# Patient Record
Sex: Female | Born: 1965
Health system: Southern US, Community
[De-identification: ages and names within clinical notes are randomized; demographics above are authoritative.]

## PROBLEM LIST (undated history)

## (undated) DIAGNOSIS — M419 Scoliosis, unspecified: Secondary | ICD-10-CM

## (undated) DIAGNOSIS — M26629 Arthralgia of temporomandibular joint, unspecified side: Secondary | ICD-10-CM

## (undated) DIAGNOSIS — J302 Other seasonal allergic rhinitis: Secondary | ICD-10-CM

## (undated) HISTORY — DX: Arthralgia of temporomandibular joint, unspecified side: M26.629

## (undated) HISTORY — DX: Scoliosis, unspecified: M41.9

## (undated) HISTORY — DX: Other seasonal allergic rhinitis: J30.2

---

## 2007-10-05 ENCOUNTER — Emergency Department: Payer: Self-pay | Admitting: Emergency Medicine

## 2008-01-09 ENCOUNTER — Ambulatory Visit: Payer: Self-pay | Admitting: Unknown Physician Specialty

## 2009-08-09 ENCOUNTER — Ambulatory Visit: Payer: Self-pay | Admitting: Family Medicine

## 2010-01-28 ENCOUNTER — Ambulatory Visit: Payer: Self-pay | Admitting: Family Medicine

## 2010-12-13 ENCOUNTER — Ambulatory Visit: Payer: Self-pay | Admitting: Family Medicine

## 2011-05-19 ENCOUNTER — Ambulatory Visit: Payer: Self-pay | Admitting: Internal Medicine

## 2011-05-25 ENCOUNTER — Ambulatory Visit: Payer: Self-pay | Admitting: Family Medicine

## 2015-05-21 ENCOUNTER — Ambulatory Visit
Admission: RE | Admit: 2015-05-21 | Discharge: 2015-05-21 | Disposition: A | Discharge status: Home / Self Care | Payer: 59 | Source: Ambulatory Visit | Attending: Family Medicine | Admitting: Family Medicine

## 2015-05-21 ENCOUNTER — Telehealth: Payer: Self-pay | Admitting: Family Medicine

## 2015-05-21 ENCOUNTER — Ambulatory Visit (INDEPENDENT_AMBULATORY_CARE_PROVIDER_SITE_OTHER): Payer: 59 | Admitting: Family Medicine

## 2015-05-21 ENCOUNTER — Encounter: Payer: Self-pay | Admitting: Family Medicine

## 2015-05-21 VITALS — BP 118/76 | HR 92 | Temp 98.2°F | Resp 16 | Wt 146.1 lb

## 2015-05-21 DIAGNOSIS — R8299 Other abnormal findings in urine: Secondary | ICD-10-CM | POA: Diagnosis not present

## 2015-05-21 DIAGNOSIS — Z131 Encounter for screening for diabetes mellitus: Secondary | ICD-10-CM | POA: Insufficient documentation

## 2015-05-21 DIAGNOSIS — R0789 Other chest pain: Secondary | ICD-10-CM | POA: Diagnosis not present

## 2015-05-21 DIAGNOSIS — Z23 Encounter for immunization: Secondary | ICD-10-CM | POA: Insufficient documentation

## 2015-05-21 DIAGNOSIS — Z1239 Encounter for other screening for malignant neoplasm of breast: Secondary | ICD-10-CM | POA: Insufficient documentation

## 2015-05-21 DIAGNOSIS — R82998 Other abnormal findings in urine: Secondary | ICD-10-CM | POA: Insufficient documentation

## 2015-05-21 DIAGNOSIS — M419 Scoliosis, unspecified: Secondary | ICD-10-CM | POA: Insufficient documentation

## 2015-05-21 DIAGNOSIS — M26629 Arthralgia of temporomandibular joint, unspecified side: Secondary | ICD-10-CM | POA: Insufficient documentation

## 2015-05-21 DIAGNOSIS — J019 Acute sinusitis, unspecified: Secondary | ICD-10-CM | POA: Insufficient documentation

## 2015-05-21 DIAGNOSIS — J0100 Acute maxillary sinusitis, unspecified: Secondary | ICD-10-CM | POA: Diagnosis not present

## 2015-05-21 DIAGNOSIS — Z1211 Encounter for screening for malignant neoplasm of colon: Secondary | ICD-10-CM | POA: Insufficient documentation

## 2015-05-21 DIAGNOSIS — J302 Other seasonal allergic rhinitis: Secondary | ICD-10-CM | POA: Insufficient documentation

## 2015-05-21 DIAGNOSIS — Z13220 Encounter for screening for lipoid disorders: Secondary | ICD-10-CM | POA: Insufficient documentation

## 2015-05-21 DIAGNOSIS — N912 Amenorrhea, unspecified: Secondary | ICD-10-CM | POA: Insufficient documentation

## 2015-05-21 DIAGNOSIS — R42 Dizziness and giddiness: Secondary | ICD-10-CM | POA: Insufficient documentation

## 2015-05-21 DIAGNOSIS — Z833 Family history of diabetes mellitus: Secondary | ICD-10-CM | POA: Insufficient documentation

## 2015-05-21 LAB — POCT URINALYSIS DIPSTICK
Bilirubin, UA: NEGATIVE
GLUCOSE UA: NEGATIVE
KETONES UA: NEGATIVE
NITRITE UA: NEGATIVE
PH UA: 6
Protein, UA: NEGATIVE
SPEC GRAV UA: 1.025
Urobilinogen, UA: 0.2

## 2015-05-21 MED ORDER — MOXIFLOXACIN HCL 400 MG PO TABS: 400.0000 mg | ORAL_TABLET | Freq: Every day | ORAL | Status: DC

## 2015-05-21 MED ORDER — IBUPROFEN 800 MG PO TABS: 800.0000 mg | ORAL_TABLET | Freq: Three times a day (TID) | ORAL | Status: DC | PRN

## 2015-05-21 MED ORDER — PREDNISONE 10 MG (21) PO TBPK: ORAL_TABLET | ORAL | Status: DC

## 2015-05-21 MED ORDER — CYCLOBENZAPRINE HCL 5 MG PO TABS: 5.0000 mg | ORAL_TABLET | Freq: Three times a day (TID) | ORAL | Status: DC | PRN

## 2015-05-21 NOTE — Patient Instructions (Signed)

## 2015-05-21 NOTE — Progress Notes (Signed)
Name: Makayla Mason   MRN: [id number]    DOB: 05/26/1966   Date:05/21/2015       Progress Note  Subjective  Chief Complaint  Chief Complaint  Patient presents with  . Spasms    patient questions pulled chest muscle  . Dizziness    patient has had some excessive fluid in her right ear  . Labs Only    HPI  [name] is a 49yo female who presents with complaints of chest wall pain. Onset several weeks ago. Located midline lower sternum. Vague sharp pain that resolves on it's own in a few seconds. No Left sided chest pain, no radiation, no nausea, no vomiting. Also having several months of ear fullness and occasional dizziness. No fevers, chills, headaches, visual changes. Does not use any medications regularly. Also wants to check her urine as it is dark but not having dysuria, pelvic pain, frequency.  Patient Active Problem List   Diagnosis Date Noted  . Allergic rhinitis, seasonal 05/21/2015  . Scoliosis 05/21/2015  . Arthralgia of temporomandibular joint 05/21/2015  . Dark urine 05/21/2015  . Subacute maxillary sinusitis 05/21/2015  . Chest wall pain 05/21/2015    History  Substance Use Topics  . Smoking status: Never Smoker   . Smokeless tobacco: Not on file  . Alcohol Use: 0.0 oz/week    0 Standard drinks or equivalent per week     Comment: occasional     Current outpatient prescriptions:  .  fexofenadine (HM FEXOFENADINE HCL) 180 MG tablet, Take by mouth., Disp: , Rfl:   History reviewed. No pertinent past surgical history.  Family History  Problem Relation Age of Onset  . Diabetes Mother   . Diabetes Father   . Hypertension Father   . Asthma Daughter   . Anemia Maternal Aunt   . Stroke Maternal Aunt   . Heart disease Maternal Grandmother   . Kidney disease Maternal Grandmother   . Hypertension Maternal Grandmother   . Cancer Cousin     breast    Allergies  Allergen Reactions  . Codeine Nausea And Vomiting     Review of Systems  Ten  systems reviewed and is negative except as mentioned in HPI.  Objective  BP 118/76 mmHg  Pulse 92  Temp(Src) 98.2 F (36.8 C) (Oral)  Resp 16  Wt 146 lb 1.6 oz (66.271 kg)  SpO2 98% Body mass index is 25.79 kg/(m^2).   Urinalysis    Component Value Date/Time   BILIRUBINUR NEGATIVE 05/21/2015 1104   PROTEINUR NEGATIVE 05/21/2015 1104   UROBILINOGEN 0.2 05/21/2015 1104   NITRITE NEGATIVE 05/21/2015 1104   LEUKOCYTESUR small (1+)* 05/21/2015 1104    Physical Exam  Constitutional: Patient appears well-developed and well-nourished. In no distress.  HEENT:  - Head: Normocephalic and atraumatic.  - Ears: Bilateral TMs gray, right TM bulging with fluid. - Nose: Nasal mucosa moist, boggy and congested. - Mouth/Throat: Oropharynx is clear and moist. No tonsillar hypertrophy or erythema. Some post nasal drainage.  - Eyes: Conjunctivae clear, EOM movements normal. PERRLA. No scleral icterus.  Neck: Normal range of motion. Neck supple. No JVD present. No thyromegaly present.  Cardiovascular: Normal rate, regular rhythm and normal heart sounds.  No murmur heard.  Pulmonary/Chest: Effort normal and breath sounds normal with some end exp wheezing at right lung base. No respiratory distress. No reproducible chest wall pain. Musculoskeletal: Normal range of motion bilateral UE and LE, no joint effusions. Peripheral vascular: Bilateral LE no edema. Neurological: CN II-XII  grossly intact with no focal deficits. Alert and oriented to person, place, and time. Coordination, balance, strength, speech and gait are normal.  Skin: Skin is warm and dry. No rash noted. No erythema.  Psychiatric: Patient has a normal mood and affect. Behavior is normal in office today. Judgment and thought content normal in office today.   Assessment & Plan  1. Dark urine Unremarkable, increase hydration.  - POCT urinalysis dipstick  2. Subacute maxillary sinusitis May benefit from daily anti-histamine and  flonase but she is a little resistant to taking medications regularly. Long standing symptoms of dizziness which I feel is related to her inner ear. Plan on treating with avelox, prednisone. If no improvement consider ENT consultation.  - moxifloxacin (AVELOX) 400 MG tablet; Take 1 tablet (400 mg total) by mouth daily.  Dispense: 7 tablet; Refill: 0 - predniSONE (STERAPRED UNI-PAK 21 TAB) 10 MG (21) TBPK tablet; Use as directed in a 6 day taper PredPak  Dispense: 21 tablet; Refill: 0 - ibuprofen (ADVIL,MOTRIN) 800 MG tablet; Take 1 tablet (800 mg total) by mouth every 8 (eight) hours as needed.  Dispense: 30 tablet; Refill: 1  3. Chest wall pain Not cardiac in etiology although she will need risk stratification with blood work and EKG if symptoms persist. Her symptoms suggest musculoskeletal etiology. Will get CXR.   - predniSONE (STERAPRED UNI-PAK 21 TAB) 10 MG (21) TBPK tablet; Use as directed in a 6 day taper PredPak  Dispense: 21 tablet; Refill: 0 - ibuprofen (ADVIL,MOTRIN) 800 MG tablet; Take 1 tablet (800 mg total) by mouth every 8 (eight) hours as needed.  Dispense: 30 tablet; Refill: 1 - cyclobenzaprine (FLEXERIL) 5 MG tablet; Take 1-2 tablets (5-10 mg total) by mouth 3 (three) times daily as needed for muscle spasms.  Dispense: 30 tablet; Refill: 1 - DG Chest 2 View; Future

## 2015-05-21 NOTE — Telephone Encounter (Signed)
Pt was prescribed omeprazole from the employee [facility] which expired in June. She is requesting a refill to be sent to employee pharmacy.

## 2015-05-21 NOTE — Telephone Encounter (Signed)
Patient requesting refill. 

## 2015-05-23 MED ORDER — OMEPRAZOLE 20 MG PO CPDR: 20.0000 mg | DELAYED_RELEASE_CAPSULE | Freq: Every day | ORAL | Status: DC

## 2015-05-26 NOTE — Telephone Encounter (Signed)
Please close encounter

## 2015-05-28 ENCOUNTER — Telehealth: Payer: Self-pay

## 2015-05-28 NOTE — Telephone Encounter (Signed)
Pt would like to go see an ENT Dr. Beverly Gust at [facility],  Could you please order a referral. She said it is for fullness in her ears. Thanks

## 2015-05-31 ENCOUNTER — Telehealth: Payer: Self-pay | Admitting: Family Medicine

## 2015-05-31 ENCOUNTER — Other Ambulatory Visit: Payer: Self-pay | Admitting: Family Medicine

## 2015-05-31 DIAGNOSIS — H938X3 Other specified disorders of ear, bilateral: Secondary | ICD-10-CM

## 2015-05-31 DIAGNOSIS — H938X1 Other specified disorders of right ear: Secondary | ICD-10-CM | POA: Insufficient documentation

## 2015-05-31 DIAGNOSIS — R42 Dizziness and giddiness: Secondary | ICD-10-CM | POA: Insufficient documentation

## 2015-05-31 NOTE — Telephone Encounter (Signed)
Referral placed. Please close encounter.

## 2015-05-31 NOTE — Telephone Encounter (Signed)
NEEDS TO KNOW WHAT SHE IS BEING SEEN AT [facility] FOR.

## 2015-05-31 NOTE — Telephone Encounter (Signed)
[facility] and notified her that it was for Fullness in her ear and dizziness and that the patient would like to see Dr. Tami Ribas.

## 2015-06-01 ENCOUNTER — Telehealth: Payer: Self-pay

## 2015-06-01 NOTE — Telephone Encounter (Signed)
ERRENOUS °

## 2015-06-01 NOTE — Telephone Encounter (Signed)
Contacted this patient to inform her that she has an appt with Dr. Tami Ribas at [facility] on 06/15/15 at 1:45pm, but she stated since she works 2nd shift it needed to be changed. The patient was given the number to that office ([phone]) and was told to call them so they can get her an appointment that would not press her for time.  A copy of this patient's demographics, insurance card, medication list and last office notes was faxed to their office at [phone]. Confirmation was received.

## 2015-12-02 ENCOUNTER — Ambulatory Visit: Payer: 59 | Admitting: Family Medicine

## 2015-12-02 DIAGNOSIS — H6982 Other specified disorders of Eustachian tube, left ear: Secondary | ICD-10-CM | POA: Diagnosis not present

## 2016-03-07 ENCOUNTER — Telehealth: Payer: Self-pay | Admitting: Family Medicine

## 2016-03-07 ENCOUNTER — Telehealth: Payer: Self-pay | Admitting: Physician Assistant

## 2016-03-07 DIAGNOSIS — J0100 Acute maxillary sinusitis, unspecified: Secondary | ICD-10-CM

## 2016-03-07 DIAGNOSIS — R0789 Other chest pain: Secondary | ICD-10-CM

## 2016-03-07 NOTE — Telephone Encounter (Signed)
Dr Nadine Counts Patient: Requesting refill on ibuprofen please send to [place]

## 2016-03-08 NOTE — Telephone Encounter (Signed)
Pt scheduled to come in 

## 2016-03-08 NOTE — Telephone Encounter (Signed)
Needs an appt please; no renal function in the chart; comment last office visit about dark urine; appropriate to see her and check kidney function before prescribing 800 mg ibuprofen

## 2016-03-15 ENCOUNTER — Ambulatory Visit (INDEPENDENT_AMBULATORY_CARE_PROVIDER_SITE_OTHER): Payer: 59 | Admitting: Family Medicine

## 2016-03-15 ENCOUNTER — Encounter: Payer: Self-pay | Admitting: Family Medicine

## 2016-03-15 VITALS — BP 110/72 | HR 85 | Temp 97.9°F | Resp 16 | Wt 144.2 lb

## 2016-03-15 DIAGNOSIS — Z1211 Encounter for screening for malignant neoplasm of colon: Secondary | ICD-10-CM

## 2016-03-15 DIAGNOSIS — Z1239 Encounter for other screening for malignant neoplasm of breast: Secondary | ICD-10-CM | POA: Diagnosis not present

## 2016-03-15 DIAGNOSIS — M25572 Pain in left ankle and joints of left foot: Secondary | ICD-10-CM | POA: Diagnosis not present

## 2016-03-15 DIAGNOSIS — Z131 Encounter for screening for diabetes mellitus: Secondary | ICD-10-CM

## 2016-03-15 DIAGNOSIS — M25511 Pain in right shoulder: Secondary | ICD-10-CM

## 2016-03-15 DIAGNOSIS — Z1159 Encounter for screening for other viral diseases: Secondary | ICD-10-CM

## 2016-03-15 DIAGNOSIS — R42 Dizziness and giddiness: Secondary | ICD-10-CM | POA: Diagnosis not present

## 2016-03-15 DIAGNOSIS — R3915 Urgency of urination: Secondary | ICD-10-CM

## 2016-03-15 DIAGNOSIS — J302 Other seasonal allergic rhinitis: Secondary | ICD-10-CM | POA: Diagnosis not present

## 2016-03-15 DIAGNOSIS — R202 Paresthesia of skin: Secondary | ICD-10-CM | POA: Diagnosis not present

## 2016-03-15 DIAGNOSIS — H938X1 Other specified disorders of right ear: Secondary | ICD-10-CM | POA: Diagnosis not present

## 2016-03-15 DIAGNOSIS — K219 Gastro-esophageal reflux disease without esophagitis: Secondary | ICD-10-CM | POA: Diagnosis not present

## 2016-03-15 DIAGNOSIS — Z79899 Other long term (current) drug therapy: Secondary | ICD-10-CM

## 2016-03-15 DIAGNOSIS — M419 Scoliosis, unspecified: Secondary | ICD-10-CM

## 2016-03-15 DIAGNOSIS — Z13220 Encounter for screening for lipoid disorders: Secondary | ICD-10-CM

## 2016-03-15 MED ORDER — CYCLOBENZAPRINE HCL 5 MG PO TABS: 5.0000 mg | ORAL_TABLET | Freq: Every day | ORAL | Status: DC

## 2016-03-15 MED ORDER — MELOXICAM 15 MG PO TABS: 15.0000 mg | ORAL_TABLET | Freq: Every day | ORAL | Status: DC

## 2016-03-15 MED ORDER — FLUTICASONE PROPIONATE 50 MCG/ACT NA SUSP: 2.0000 | Freq: Every day | NASAL | Status: DC

## 2016-03-15 MED ORDER — OMEPRAZOLE 40 MG PO CPDR: 40.0000 mg | DELAYED_RELEASE_CAPSULE | Freq: Every day | ORAL | Status: DC

## 2016-03-15 MED ORDER — FEXOFENADINE HCL 180 MG PO TABS: 180.0000 mg | ORAL_TABLET | Freq: Every day | ORAL | Status: DC

## 2016-03-15 NOTE — Progress Notes (Signed)
Name: Makayla Mason   MRN: [id number]    DOB: 01-Dec-1965   Date:03/15/2016       Progress Note  Subjective  Chief Complaint  Chief Complaint  Patient presents with  . Medication Refill    ibuprofen and omeprazole.  . Ear Pain    patient has been having some right ear fullness and fluid. patient stated that the meclizine is not working.  . Allergic Rhinitis     patient needs a refill of her fexofenadine  . Bursitis    right shoulder  . Numbness    patient stated that she has been having some numbness and tingling in her left foot.    HPI  Ear fullness: she has been having problems for about one year. She states that every time she goes to Urgent Care she has fluid behind her ear. She states it is on the right side only, feels full and at times a pulling sensation on right ear, at times has a sensation of falling, but no spinning. No associated nausea or vomiting  Urinary Urgency: going on for the past months, not associated with dysuria or odor. No hematuria.   AR: she is taking fexofenadine daily . She has nasal congestion and post-nasal drainage  Chronic left foot pain: she fell a couple of years ago. Sprained while stepping on a whole on the ground. Since than she states she has aching sensation on left ankle and also has some burning sensation after her showers on top of left foot.   Scoliosis: she has back spasms and takes Flexeril prn at night to control symptoms  Right shoulder pain: symptoms have been present for about 8 months. Some discomfort with abduction and internal rotation. Pain in on right anterior shoulder. Improves with hot water, worse with activity. 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   GERD: taking omeprazole daily, still has symptoms of heartburn about twice weekly and would like to go up on medication dose, discussed risks associated with long term use of PPI, but she still wants to take medication   Patient Active Problem  List   Diagnosis Date Noted  . GERD without esophagitis 03/15/2016  . Left ankle pain 03/15/2016  . Right shoulder pain 03/15/2016  . Sensation of fullness in right ear 05/31/2015  . Dizziness of unknown cause 05/31/2015  . Allergic rhinitis, seasonal 05/21/2015  . Scoliosis 05/21/2015  . 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 No pertinent past surgical history.  Family History  Problem Relation Age of Onset  . Diabetes Mother   . Diabetes Father   . Hypertension Father   . Asthma Daughter   . Anemia Maternal Aunt   . Stroke Maternal Aunt   . Heart disease Maternal Grandmother   . Kidney disease Maternal Grandmother   . Hypertension Maternal Grandmother   . Cancer Cousin     breast    Social History   Social History  . Marital Status: Divorced    Spouse Name: N/A  . Number of Children: N/A  . Years of Education: N/A   Occupational History  . Not on file.   Social History Main Topics  . Smoking status: Never Smoker   . Smokeless tobacco: Not on file  . Alcohol Use: 0.0 oz/week    0 Standard drinks or equivalent per week     Comment: occasional  . Drug Use: No  . Sexual Activity: Yes  Other Topics Concern  . Not on file   Social History Narrative     Current outpatient prescriptions:  .  cyclobenzaprine (FLEXERIL) 5 MG tablet, Take 1 tablet (5 mg total) by mouth at bedtime., Disp: 30 tablet, Rfl: 1 .  fexofenadine (HM FEXOFENADINE HCL) 180 MG tablet, Take 1 tablet (180 mg total) by mouth daily., Disp: 90 tablet, Rfl: 1 .  fluticasone (FLONASE) 50 MCG/ACT nasal spray, Place 2 sprays into both nostrils daily., Disp: 16 g, Rfl: 2 .  meloxicam (MOBIC) 15 MG tablet, Take 1 tablet (15 mg total) by mouth daily., Disp: 30 tablet, Rfl: 0 .  omeprazole (PRILOSEC) 40 MG capsule, Take 1 capsule (40 mg total) by mouth daily., Disp: 90 capsule, Rfl: 1  Allergies  Allergen Reactions  . Codeine Nausea And Vomiting      ROS  Constitutional: Negative for fever or weight change.  Respiratory: Negative for cough and shortness of breath.   Cardiovascular: Negative for chest pain or palpitations.  Gastrointestinal: Negative for abdominal pain, no bowel changes.  Musculoskeletal: Negative for gait problem or joint swelling.  Skin: Negative for rash.  Neurological: Negative for dizziness or headache.  No other specific complaints in a complete review of systems (except as listed in HPI above).  Objective  Filed Vitals:   03/15/16 1001  BP: 110/72  Pulse: 85  Temp: 97.9 F (36.6 C)  TempSrc: Oral  Resp: 16  Weight: 144 lb 3.2 oz (65.409 kg)  SpO2: 98%    Body mass index is 25.45 kg/(m^2).  Physical Exam  Constitutional: Patient appears well-developed and well-nourished. Obese  No distress.  HEENT: head atraumatic, normocephalic, pupils equal and reactive to light, ears TM normal bilaterally, neck supple, throat within normal limits Cardiovascular: Normal rate, regular rhythm and normal heart sounds.  No murmur heard. No BLE edema. Pulmonary/Chest: Effort normal and breath sounds normal. No respiratory distress. Abdominal: Soft.  There is no tenderness. Psychiatric: Patient has a normal mood and affect. behavior is normal. Judgment and thought content normal. Muscular Skeletal: scoliosis, normal rom of right shoulder, mild pain during palpation of right anterior shoulder   PHQ2/9: Depression screen [facility] 2/9 03/15/2016 05/21/2015  Decreased Interest 0 0  Down, Depressed, Hopeless 0 0  PHQ - 2 Score 0 0   Fall Risk: Fall Risk  03/15/2016 05/21/2015  Falls in the past year? No No   Functional Status Survey: Is the patient deaf or have difficulty hearing?: No Does the patient have difficulty seeing, even when wearing glasses/contacts?: No Does the patient have difficulty concentrating, remembering, or making decisions?: No Does the patient have difficulty walking or climbing stairs?:  No Does the patient have difficulty dressing or bathing?: No Does the patient have difficulty doing errands alone such as visiting a doctor's office or shopping?: No    Assessment & Plan  1. Sensation of fullness in right ear  Discussed referral to ENT, but she would like to hold off  2. Dizziness of unknown cause  Advised referral to ENT, but she would like to hold off for now - fluticasone (FLONASE) 50 MCG/ACT nasal spray; Place 2 sprays into both nostrils daily.  Dispense: 16 g; Refill: 2  3. Allergic rhinitis, seasonal  - fexofenadine (HM FEXOFENADINE HCL) 180 MG tablet; Take 1 tablet (180 mg total) by mouth daily.  Dispense: 90 tablet; Refill: 1  4. GERD without esophagitis  - omeprazole (PRILOSEC) 40 MG capsule; Take 1 capsule (40 mg total) by mouth daily.  Dispense:  90 capsule; Refill: 1  5. Left ankle pain  From old sprain, reassurance  6. Paresthesia of left foot  - Vitamin B12  7. Breast cancer screening  - MM Digital Screening; Future  8. Colon cancer screening  -referral GI  9. Lipid screening  - Lipid panel  10. Long-term use of high-risk medication  - Comprehensive metabolic panel  11. DM (diabetes mellitus screen)  - Hemoglobin A1c  12. Scoliosis  - cyclobenzaprine (FLEXERIL) 5 MG tablet; Take 1 tablet (5 mg total) by mouth at bedtime.  Dispense: 30 tablet; Refill: 1  13. Right shoulder pain  Likely OA versus bursitis - meloxicam (MOBIC) 15 MG tablet; Take 1 tablet (15 mg total) by mouth daily.  Dispense: 30 tablet; Refill: 0

## 2016-03-17 ENCOUNTER — Other Ambulatory Visit: Payer: Self-pay | Admitting: Family Medicine

## 2016-03-17 LAB — URINE CULTURE: Organism ID, Bacteria: NO GROWTH

## 2016-03-17 MED ORDER — SOLIFENACIN SUCCINATE 10 MG PO TABS: 5.0000 mg | ORAL_TABLET | Freq: Every day | ORAL | Status: DC

## 2016-03-17 NOTE — Progress Notes (Signed)
Please give her a voucher

## 2016-03-20 DIAGNOSIS — Z79899 Other long term (current) drug therapy: Secondary | ICD-10-CM | POA: Diagnosis not present

## 2016-03-20 DIAGNOSIS — Z131 Encounter for screening for diabetes mellitus: Secondary | ICD-10-CM | POA: Diagnosis not present

## 2016-03-20 DIAGNOSIS — H938X1 Other specified disorders of right ear: Secondary | ICD-10-CM | POA: Diagnosis not present

## 2016-03-20 DIAGNOSIS — Z13220 Encounter for screening for lipoid disorders: Secondary | ICD-10-CM | POA: Diagnosis not present

## 2016-03-21 LAB — LIPID PANEL
CHOL/HDL RATIO: 2.9 ratio (ref 0.0–4.4)
Cholesterol, Total: 169 mg/dL (ref 100–199)
HDL: 58 mg/dL (ref >39)
LDL CALC: 96 mg/dL (ref 0–99)
TRIGLYCERIDES: 76 mg/dL (ref 0–149)
VLDL CHOLESTEROL CAL: 15 mg/dL (ref 5–40)

## 2016-03-21 LAB — COMPREHENSIVE METABOLIC PANEL
ALBUMIN: 4 g/dL (ref 3.5–5.5)
ALT: 14 IU/L (ref 0–32)
AST: 18 IU/L (ref 0–40)
Albumin/Globulin Ratio: 1.3 (ref 1.2–2.2)
Alkaline Phosphatase: 98 IU/L (ref 39–117)
BUN / CREAT RATIO: 21 (ref 9–23)
BUN: 15 mg/dL (ref 6–24)
Bilirubin Total: <0.2 mg/dL (ref 0.0–1.2)
CO2: 27 mmol/L (ref 18–29)
CREATININE: 0.7 mg/dL (ref 0.57–1.00)
Calcium: 9.4 mg/dL (ref 8.7–10.2)
Chloride: 102 mmol/L (ref 96–106)
GFR calc Af Amer: 115 mL/min/{1.73_m2} (ref >59)
GFR, EST NON AFRICAN AMERICAN: 100 mL/min/{1.73_m2} (ref >59)
GLOBULIN, TOTAL: 3.2 g/dL (ref 1.5–4.5)
GLUCOSE: 86 mg/dL (ref 65–99)
Potassium: 4.6 mmol/L (ref 3.5–5.2)
SODIUM: 143 mmol/L (ref 134–144)
TOTAL PROTEIN: 7.2 g/dL (ref 6.0–8.5)

## 2016-03-21 LAB — HEMOGLOBIN A1C
ESTIMATED AVERAGE GLUCOSE: 126 mg/dL
Hgb A1c MFr Bld: 6 % — ABNORMAL HIGH (ref 4.8–5.6)

## 2016-03-21 LAB — HEPATITIS C ANTIBODY: Hep C Virus Ab: <0.1 s/co ratio (ref 0.0–0.9)

## 2016-03-21 LAB — VITAMIN B12: Vitamin B-12: 1106 pg/mL — ABNORMAL HIGH (ref 211–946)

## 2016-03-24 ENCOUNTER — Ambulatory Visit: Payer: 59 | Attending: Family Medicine

## 2016-04-07 ENCOUNTER — Ambulatory Visit: Payer: Self-pay | Admitting: Physician Assistant

## 2016-04-07 ENCOUNTER — Encounter: Payer: Self-pay | Admitting: Physician Assistant

## 2016-04-07 VITALS — BP 130/90 | HR 100 | Temp 98.5°F

## 2016-04-07 DIAGNOSIS — K047 Periapical abscess without sinus: Secondary | ICD-10-CM

## 2016-04-07 MED ORDER — AMOXICILLIN 875 MG PO TABS: 875.0000 mg | ORAL_TABLET | Freq: Two times a day (BID) | ORAL | Status: DC

## 2016-04-07 NOTE — Progress Notes (Signed)
S: c/o r sided jaw pain and swelling, states her tooth broke off about 2 weeks ago but woke up today and her face was swollen on same side, no fever/chills, has appointment with her dentist on June 13  O: vitals wnl, nad, r side of face swollen at maxillary area, tender to palp, gum line with a little swelling, no pus noted, neck supple no lymph, lungs c t a, cv rrr  A: acute dental abscess  P: amoxil 875mg  bid x 10d

## 2016-04-14 NOTE — Telephone Encounter (Signed)
Patient acknowledge understanding

## 2016-04-18 ENCOUNTER — Ambulatory Visit: Payer: 59 | Admitting: Family Medicine

## 2016-05-05 DIAGNOSIS — M25571 Pain in right ankle and joints of right foot: Secondary | ICD-10-CM | POA: Diagnosis not present

## 2016-05-06 DIAGNOSIS — S93402A Sprain of unspecified ligament of left ankle, initial encounter: Secondary | ICD-10-CM | POA: Diagnosis not present

## 2016-05-06 DIAGNOSIS — M25572 Pain in left ankle and joints of left foot: Secondary | ICD-10-CM | POA: Diagnosis not present

## 2016-05-12 ENCOUNTER — Ambulatory Visit: Payer: Self-pay | Admitting: Family

## 2016-05-12 ENCOUNTER — Encounter: Payer: Self-pay | Admitting: Physician Assistant

## 2016-05-12 VITALS — BP 128/59 | HR 85 | Temp 97.7°F

## 2016-05-12 DIAGNOSIS — K047 Periapical abscess without sinus: Secondary | ICD-10-CM

## 2016-05-12 MED ORDER — AMOXICILLIN 875 MG PO TABS: 875.0000 mg | ORAL_TABLET | Freq: Two times a day (BID) | ORAL | Status: DC

## 2016-05-12 NOTE — Progress Notes (Signed)
S/ one week hx of  Throbbing dental pain , tooth is broken off ; seen last mo and dentist had family emergency so her appt was changed . O/ VSS pleasant NAD oral cavity with vacant cavity R upper , tender to palpation, no active drainage, no swelling , Neck without adenopathy  A/ Dental pain , prob abcess  P amoxiciallan 875 mg one bid , advised to make appt with her dentist asap for follow up. Saline rinses prn. If sxs worsen seek urgent care.

## 2016-05-30 ENCOUNTER — Encounter: Payer: 59 | Admitting: Family Medicine

## 2016-12-12 ENCOUNTER — Ambulatory Visit (INDEPENDENT_AMBULATORY_CARE_PROVIDER_SITE_OTHER): Payer: 59 | Admitting: Family Medicine

## 2016-12-12 ENCOUNTER — Encounter: Payer: Self-pay | Admitting: Family Medicine

## 2016-12-12 VITALS — BP 124/82 | HR 93 | Temp 97.9°F | Resp 16 | Ht 63.0 in | Wt 146.1 lb

## 2016-12-12 DIAGNOSIS — R7303 Prediabetes: Secondary | ICD-10-CM | POA: Diagnosis not present

## 2016-12-12 DIAGNOSIS — J019 Acute sinusitis, unspecified: Secondary | ICD-10-CM | POA: Diagnosis not present

## 2016-12-12 LAB — POCT GLYCOSYLATED HEMOGLOBIN (HGB A1C): Hemoglobin A1C: 6

## 2016-12-12 MED ORDER — MOMETASONE FUROATE 50 MCG/ACT NA SUSP: 2.0000 | Freq: Every day | NASAL | 1 refills | Status: DC

## 2016-12-12 MED ORDER — AMOXICILLIN-POT CLAVULANATE 875-125 MG PO TABS: 1.0000 | ORAL_TABLET | Freq: Two times a day (BID) | ORAL | 0 refills | Status: DC

## 2016-12-12 NOTE — Progress Notes (Signed)
Name: Makayla Mason   MRN: [id number]    DOB: 1966/08/11   Date:12/12/2016       Progress Note  Subjective  Chief Complaint  Chief Complaint  Patient presents with  . Allergic Rhinitis     sneezing, drainage, congestion, headaches  . Medication Refill    Sinusitis  This is a new problem. There has been no fever. Associated symptoms include congestion and sinus pressure. Pertinent negatives include no chills, coughing, ear pain, headaches, shortness of breath or sore throat. (Having a lot of drainage, sinus congestion, )     Past Medical History:  Diagnosis Date  . Allergic rhinitis, seasonal   . Mild scoliosis   . TMJ arthralgia    right side    No past surgical history on file.  Family History  Problem Relation Age of Onset  . Diabetes Mother   . Diabetes Father   . Hypertension Father   . Asthma Daughter   . Anemia Maternal Aunt   . Stroke Maternal Aunt   . Heart disease Maternal Grandmother   . Kidney disease Maternal Grandmother   . Hypertension Maternal Grandmother   . Cancer Cousin     breast    Social History   Social History  . Marital status: Divorced    Spouse name: N/A  . Number of children: N/A  . Years of education: N/A   Occupational History  . Not on file.   Social History Main Topics  . Smoking status: Never Smoker  . Smokeless tobacco: Never Used  . Alcohol use 0.0 oz/week     Comment: occasional  . Drug use: No  . Sexual activity: Yes   Other Topics Concern  . Not on file   Social History Narrative  . No narrative on file     Current Outpatient Prescriptions:  .  fluticasone (FLONASE) 50 MCG/ACT nasal spray, Place 2 sprays into both nostrils daily., Disp: 16 g, Rfl: 2 .  ibuprofen (ADVIL,MOTRIN) 800 MG tablet, , Disp: , Rfl: 0 .  omeprazole (PRILOSEC) 40 MG capsule, Take 1 capsule (40 mg total) by mouth daily., Disp: 90 capsule, Rfl: 1  Allergies  Allergen Reactions  . Codeine Nausea And Vomiting     Review of Systems   Constitutional: Negative for chills and fever.  HENT: Positive for congestion and sinus pressure. Negative for ear pain and sore throat.   Eyes: Negative for blurred vision and double vision.  Respiratory: Negative for cough and shortness of breath.   Neurological: Negative for headaches.    Objective  Vitals:   12/12/16 1150  BP: 124/82  Pulse: 93  Resp: 16  Temp: 97.9 F (36.6 C)  TempSrc: Oral  SpO2: 96%  Weight: 146 lb 1.6 oz (66.3 kg)  Height: 5\' 3"  (1.6 m)    Physical Exam  Constitutional: She is oriented to person, place, and time and well-developed, well-nourished, and in no distress.  HENT:  Head: Normocephalic and atraumatic.  Right Ear: Tympanic membrane and ear canal normal. No drainage, swelling or tenderness.  Left Ear: Tympanic membrane and ear canal normal. No drainage, swelling or tenderness.  Nose: Right sinus exhibits no maxillary sinus tenderness and no frontal sinus tenderness. Left sinus exhibits no maxillary sinus tenderness and no frontal sinus tenderness.  Mouth/Throat: Posterior oropharyngeal erythema present.  Nasal mucosal inflammation, turbinate hypertrophy. Oropharyngeal inflammation, mucus visible   Cardiovascular: Normal rate, regular rhythm and normal heart sounds.   No murmur heard. Pulmonary/Chest: Effort normal. She has  no wheezes. She has no rales.  Neurological: She is alert and oriented to person, place, and time.  Psychiatric: Mood, memory, affect and judgment normal.  Nursing note and vitals reviewed.     Assessment & Plan  1. Acute sinusitis with symptoms greater than 10 days Start on Augmentin for treatment, we will switch Flonase to Nasonex. - amoxicillin-clavulanate (AUGMENTIN) 875-125 MG tablet; Take 1 tablet by mouth 2 (two) times daily.  Dispense: 20 tablet; Refill: 0 - mometasone (NASONEX) 50 MCG/ACT nasal spray; Place 2 sprays into the nose daily.  Dispense: 17 g; Refill: 1  2. Prediabetes A1c is 6.0%, consistent  with prediabetes - POCT HgB A1C   Makayla Mason [facility] 12/12/2016 12:02 PM

## 2016-12-29 ENCOUNTER — Ambulatory Visit: Payer: 59 | Attending: Family Medicine

## 2017-01-25 ENCOUNTER — Other Ambulatory Visit: Payer: Self-pay | Admitting: Family Medicine

## 2017-01-25 ENCOUNTER — Ambulatory Visit (INDEPENDENT_AMBULATORY_CARE_PROVIDER_SITE_OTHER): Payer: 59 | Admitting: Family Medicine

## 2017-01-25 ENCOUNTER — Encounter: Payer: Self-pay | Admitting: Family Medicine

## 2017-01-25 VITALS — BP 116/74 | HR 81 | Temp 97.8°F | Resp 16 | Ht 63.0 in | Wt 147.1 lb

## 2017-01-25 DIAGNOSIS — Z13220 Encounter for screening for lipoid disorders: Secondary | ICD-10-CM

## 2017-01-25 DIAGNOSIS — R7303 Prediabetes: Secondary | ICD-10-CM | POA: Diagnosis not present

## 2017-01-25 DIAGNOSIS — Z01419 Encounter for gynecological examination (general) (routine) without abnormal findings: Secondary | ICD-10-CM | POA: Diagnosis not present

## 2017-01-25 DIAGNOSIS — Z23 Encounter for immunization: Secondary | ICD-10-CM

## 2017-01-25 DIAGNOSIS — Z131 Encounter for screening for diabetes mellitus: Secondary | ICD-10-CM

## 2017-01-25 DIAGNOSIS — Z124 Encounter for screening for malignant neoplasm of cervix: Secondary | ICD-10-CM

## 2017-01-25 DIAGNOSIS — Z113 Encounter for screening for infections with a predominantly sexual mode of transmission: Secondary | ICD-10-CM

## 2017-01-25 DIAGNOSIS — J302 Other seasonal allergic rhinitis: Secondary | ICD-10-CM | POA: Diagnosis not present

## 2017-01-25 DIAGNOSIS — Z1211 Encounter for screening for malignant neoplasm of colon: Secondary | ICD-10-CM

## 2017-01-25 DIAGNOSIS — Z79899 Other long term (current) drug therapy: Secondary | ICD-10-CM

## 2017-01-25 DIAGNOSIS — K219 Gastro-esophageal reflux disease without esophagitis: Secondary | ICD-10-CM | POA: Diagnosis not present

## 2017-01-25 DIAGNOSIS — Z1231 Encounter for screening mammogram for malignant neoplasm of breast: Secondary | ICD-10-CM | POA: Diagnosis not present

## 2017-01-25 DIAGNOSIS — Z0000 Encounter for general adult medical examination without abnormal findings: Secondary | ICD-10-CM

## 2017-01-25 DIAGNOSIS — Z1239 Encounter for other screening for malignant neoplasm of breast: Secondary | ICD-10-CM

## 2017-01-25 DIAGNOSIS — R87612 Low grade squamous intraepithelial lesion on cytologic smear of cervix (LGSIL): Secondary | ICD-10-CM | POA: Diagnosis not present

## 2017-01-25 MED ORDER — OMEPRAZOLE 40 MG PO CPDR: 40.0000 mg | DELAYED_RELEASE_CAPSULE | Freq: Every day | ORAL | 1 refills | Status: DC

## 2017-01-25 MED ORDER — FLUTICASONE PROPIONATE 50 MCG/ACT NA SUSP: 2.0000 | Freq: Every day | NASAL | 2 refills | Status: DC

## 2017-01-25 NOTE — Progress Notes (Signed)
Name: Makayla Mason   MRN: [id number]    DOB: Jan 05, 1966   Date:01/25/2017       Progress Note  Subjective  Chief Complaint  Chief Complaint  Patient presents with  . Annual Exam    HPI  Well Woman: she has been sexually active, last pap smear many years ago, no vaginal discharge, pain during intercourse, no bladder problems  GERD: she takes Omeprazole daily, and symptoms are controlled, no heartburn or regurgitation, avoiding fried food and spicy food. Discussed long term use of PPI, she is now taking it prn, discussed switching to Ranitidine  Pre-diabetes: she denies polyphagia, polydipsia or polyphagia. Last hgbA1C was 6.0%   Seasonal allergic rhinitis: seen in Feb by Dr. Manuella Mason with symptoms of sinusitis, took Augmentin and face pressure improved however continues to have nasal congestion and a dry cough, did not fill rx of nasal spray because of cost. Eyes get watery at times.   Patient Active Problem List   Diagnosis Date Noted  . GERD without esophagitis 03/15/2016  . Left ankle pain 03/15/2016  . Right shoulder pain 03/15/2016  . Sensation of fullness in right ear 05/31/2015  . Dizziness of unknown cause 05/31/2015  . Allergic rhinitis, seasonal 05/21/2015  . Scoliosis 05/21/2015  . Arthralgia of temporomandibular joint 05/21/2015  . Acute sinusitis with symptoms greater than 10 days 05/21/2015    History reviewed. No pertinent surgical history.  Family History  Problem Relation Age of Onset  . Diabetes Mother   . Diabetes Father   . Hypertension Father   . Asthma Daughter   . Anemia Maternal Aunt   . Stroke Maternal Aunt   . Heart disease Maternal Grandmother   . Kidney disease Maternal Grandmother   . Hypertension Maternal Grandmother   . Cancer Cousin     breast    Social History   Social History  . Marital status: Divorced    Spouse name: N/A  . Number of children: N/A  . Years of education: N/A   Occupational History  . Not on file.   Social  History Main Topics  . Smoking status: Never Smoker  . Smokeless tobacco: Never Used  . Alcohol use 0.0 oz/week     Comment: occasional  . Drug use: No  . Sexual activity: Yes   Other Topics Concern  . Not on file   Social History Narrative  . No narrative on file     Current Outpatient Prescriptions:  .  fluticasone (FLONASE) 50 MCG/ACT nasal spray, Place 2 sprays into both nostrils daily., Disp: 16 g, Rfl: 2 .  ibuprofen (ADVIL,MOTRIN) 800 MG tablet, , Disp: , Rfl: 0 .  omeprazole (PRILOSEC) 40 MG capsule, Take 1 capsule (40 mg total) by mouth daily., Disp: 90 capsule, Rfl: 1  Allergies  Allergen Reactions  . Codeine Nausea And Vomiting     ROS  Constitutional: Negative for fever or weight change.  Respiratory: Positive for mild  cough ( since last URI last month ) no  shortness of breath.   Cardiovascular: Negative for chest pain or palpitations.  Gastrointestinal: Negative for abdominal pain, no bowel changes.  Musculoskeletal: Negative for gait problem or joint swelling.  Skin: Negative for rash.  Neurological: Negative for dizziness or headache.  No other specific complaints in a complete review of systems (except as listed in HPI above).  Objective  Vitals:   01/25/17 1013  BP: 116/74  Pulse: 81  Resp: 16  Temp: 97.8 F (36.6 C)  SpO2: 98%  Weight: 147 lb 2 oz (66.7 kg)  Height: 5\' 3"  (1.6 m)    Body mass index is 26.06 kg/m.  Physical Exam  Constitutional: Patient appears well-developed and well-nourished. No distress.  HENT: Head: Normocephalic and atraumatic. Ears: B TMs ok, no erythema or effusion; Nose: Nose normal. Mouth/Throat: Oropharynx is clear and moist. No oropharyngeal exudate.  Eyes: Conjunctivae and EOM are normal. Pupils are equal, round, and reactive to light. No scleral icterus.  Neck: Normal range of motion. Neck supple. No JVD present. No thyromegaly present.  Cardiovascular: Normal rate, regular rhythm and normal heart sounds.   No murmur heard. No BLE edema. Pulmonary/Chest: Effort normal and breath sounds normal. No respiratory distress. Abdominal: Soft. Bowel sounds are normal, no distension. There is no tenderness. no masses Breast: no lumps or masses, no nipple discharge or rashes FEMALE GENITALIA:  External genitalia normal External urethra normal Vaginal vault normal without discharge or lesions Cervix normal without discharge or lesions Bimanual exam normal without masses RECTAL: not done Musculoskeletal: Normal range of motion, no joint effusions. No gross deformities Neurological: he is alert and oriented to person, place, and time. No cranial nerve deficit. Coordination, balance, strength, speech and gait are normal.  Skin: Skin is warm and dry. No rash noted. No erythema.  Psychiatric: Patient has a normal mood and affect. behavior is normal. Judgment and thought content normal.  Recent Results (from the past 2160 hour(s))  POCT HgB A1C     Status: None   Collection Time: 12/12/16 12:36 PM  Result Value Ref Range   Hemoglobin A1C 6.0       PHQ2/9: Depression screen [facility] 2/9 01/25/2017 03/15/2016 05/21/2015  Decreased Interest 0 0 0  Down, Depressed, Hopeless 0 0 0  PHQ - 2 Score 0 0 0     Fall Risk: Fall Risk  01/25/2017 03/15/2016 05/21/2015  Falls in the past year? No No No     Functional Status Survey: Is the patient deaf or have difficulty hearing?: No Does the patient have difficulty seeing, even when wearing glasses/contacts?: No Does the patient have difficulty concentrating, remembering, or making decisions?: No Does the patient have difficulty walking or climbing stairs?: No Does the patient have difficulty dressing or bathing?: No Does the patient have difficulty doing errands alone such as visiting a doctor's office or shopping?: No   Assessment & Plan  1. Well woman exam  Discussed importance of 150 minutes of physical activity weekly, eat two servings of fish weekly, eat  one serving of tree nuts ( cashews, pistachios, pecans, almonds.Marland Kitchen) every other day, eat 6 servings of fruit/vegetables daily and drink plenty of water and avoid sweet beverages.  - COMPLETE METABOLIC PANEL WITH GFR - CBC with Differential/Platelet - Hemoglobin A1c - Insulin, fasting - Lipid panel  2. Lipid screening  - Lipid panel  3. Screening for diabetes mellitus (DM)  - Hemoglobin A1c - Insulin, fasting  4. Breast cancer screening  - MM Digital Screening; Future  5. Colon cancer screening  - Ambulatory referral to Gastroenterology  6. Long-term use of high-risk medication  - COMPLETE METABOLIC PANEL WITH GFR - CBC with Differential/Platelet  7. Prediabetes  -hbA1C  8. GERD without esophagitis  - omeprazole (PRILOSEC) 40 MG capsule; Take 1 capsule (40 mg total) by mouth daily.  Dispense: 90 capsule; Refill: 1  9. Chronic seasonal allergic rhinitis, unspecified trigger  - fluticasone (FLONASE) 50 MCG/ACT nasal spray; Place 2 sprays into both nostrils daily.  Dispense:  16 g; Refill: 2  10. Need for Tdap vaccination  - Tdap vaccine greater than or equal to 7yo IM  11. Cervical cancer screening  - Pap IG, CT/NG NAA, and HPV (high risk)  12. Routine screening for STI (sexually transmitted infection)  - HIV antibody - RPR

## 2017-01-25 NOTE — Patient Instructions (Signed)
Ask pharmacist about Ranitidine to take prn in place of Omeprazole

## 2017-01-29 ENCOUNTER — Other Ambulatory Visit: Payer: Self-pay | Admitting: Family Medicine

## 2017-01-29 DIAGNOSIS — R87612 Low grade squamous intraepithelial lesion on cytologic smear of cervix (LGSIL): Secondary | ICD-10-CM

## 2017-01-29 LAB — PAP IG, CT-NG NAA, HPV HIGH-RISK
CHLAMYDIA PROBE AMP: NOT DETECTED
GC Probe Amp: NOT DETECTED
HPV DNA High Risk: NOT DETECTED

## 2017-01-31 ENCOUNTER — Telehealth: Payer: Self-pay | Admitting: Obstetrics & Gynecology

## 2017-01-31 ENCOUNTER — Telehealth: Payer: Self-pay | Admitting: Family Medicine

## 2017-01-31 NOTE — Telephone Encounter (Signed)
Pt states she had to be referred to [facility] for a abnormal pap and they can not see her until May. Pt wants to know if she can be referred to [facility]. Please advise.

## 2017-01-31 NOTE — Telephone Encounter (Signed)
Changed referral to [facility] and scheduled appointment for tomorrow 02/01/17 at 11 a.m. With Dr. Amalia Hailey

## 2017-02-01 ENCOUNTER — Telehealth: Payer: Self-pay | Admitting: Family Medicine

## 2017-02-01 ENCOUNTER — Other Ambulatory Visit: Payer: Self-pay | Admitting: Family Medicine

## 2017-02-01 ENCOUNTER — Ambulatory Visit (INDEPENDENT_AMBULATORY_CARE_PROVIDER_SITE_OTHER): Payer: 59 | Admitting: Obstetrics and Gynecology

## 2017-02-01 ENCOUNTER — Encounter: Payer: Self-pay | Admitting: Obstetrics and Gynecology

## 2017-02-01 VITALS — BP 110/70 | HR 76 | Ht 63.0 in | Wt 145.1 lb

## 2017-02-01 DIAGNOSIS — Z13220 Encounter for screening for lipoid disorders: Secondary | ICD-10-CM | POA: Diagnosis not present

## 2017-02-01 DIAGNOSIS — Z131 Encounter for screening for diabetes mellitus: Secondary | ICD-10-CM | POA: Diagnosis not present

## 2017-02-01 DIAGNOSIS — Z01419 Encounter for gynecological examination (general) (routine) without abnormal findings: Secondary | ICD-10-CM | POA: Diagnosis not present

## 2017-02-01 DIAGNOSIS — R87612 Low grade squamous intraepithelial lesion on cytologic smear of cervix (LGSIL): Secondary | ICD-10-CM | POA: Diagnosis not present

## 2017-02-01 DIAGNOSIS — Z113 Encounter for screening for infections with a predominantly sexual mode of transmission: Secondary | ICD-10-CM | POA: Diagnosis not present

## 2017-02-01 DIAGNOSIS — Z79899 Other long term (current) drug therapy: Secondary | ICD-10-CM | POA: Diagnosis not present

## 2017-02-01 LAB — CBC WITH DIFFERENTIAL/PLATELET
Basophils Absolute: 0 cells/uL (ref 0–200)
Basophils Relative: 0 %
EOS PCT: 4 %
Eosinophils Absolute: 332 cells/uL (ref 15–500)
HEMATOCRIT: 43.7 % (ref 35.0–45.0)
Hemoglobin: 14.1 g/dL (ref 11.7–15.5)
LYMPHS ABS: 3320 {cells}/uL (ref 850–3900)
LYMPHS PCT: 40 %
MCH: 28.7 pg (ref 27.0–33.0)
MCHC: 32.3 g/dL (ref 32.0–36.0)
MCV: 89 fL (ref 80.0–100.0)
MPV: 9.8 fL (ref 7.5–12.5)
Monocytes Absolute: 498 cells/uL (ref 200–950)
Monocytes Relative: 6 %
NEUTROS ABS: 4150 {cells}/uL (ref 1500–7800)
NEUTROS PCT: 50 %
Platelets: 358 10*3/uL (ref 140–400)
RBC: 4.91 MIL/uL (ref 3.80–5.10)
RDW: 15 % (ref 11.0–15.0)
WBC: 8.3 10*3/uL (ref 3.8–10.8)

## 2017-02-01 NOTE — Progress Notes (Signed)
HPI:      [name] is a 51 y.o. [phone] who LMP was No LMP recorded. Patient is postmenopausal.  Subjective:   She presents today Sent because of abnormal cervical cytology on Pap but negative HPV. She states that she has never had abnormal Pap smear in the past. She denies any new sexual partners or known contacts with HPV.  She complains of occasional vaginal dryness/burning. She would like to schedule a time to discuss this further and be examined.    Hx: The following portions of the patient's history were reviewed and updated as appropriate:              She  has a past medical history of Allergic rhinitis, seasonal; Mild scoliosis; and TMJ arthralgia. She  does not have any pertinent problems on file. She  has no past surgical history on file. Her family history includes Anemia in her maternal aunt; Asthma in her daughter; Cancer in her cousin; Diabetes in her father and mother; Heart disease in her maternal grandmother; Hypertension in her father and maternal grandmother; Kidney disease in her maternal grandmother; Stroke in her maternal aunt. She  reports that she has never smoked. She has never used smokeless tobacco. She reports that she drinks alcohol. She reports that she does not use drugs. Current Outpatient Prescriptions on File Prior to Visit  Medication Sig Dispense Refill  . fluticasone (FLONASE) 50 MCG/ACT nasal spray Place 2 sprays into both nostrils daily. 16 g 2  . ibuprofen (ADVIL,MOTRIN) 800 MG tablet   0  . omeprazole (PRILOSEC) 40 MG capsule Take 1 capsule (40 mg total) by mouth daily. 90 capsule 1   No current facility-administered medications on file prior to visit.          Review of Systems:  Review of Systems  Constitutional: Denied constitutional symptoms, night sweats, recent illness, fatigue, fever, insomnia and weight loss.  Eyes: Denied eye symptoms, eye pain, photophobia, vision change and visual disturbance.  Ears/Nose/Throat/Neck:  Denied ear, nose, throat or neck symptoms, hearing loss, nasal discharge, sinus congestion and sore throat.  Cardiovascular: Denied cardiovascular symptoms, arrhythmia, chest pain/pressure, edema, exercise intolerance, orthopnea and palpitations.  Respiratory: Denied pulmonary symptoms, asthma, pleuritic pain, productive sputum, cough, dyspnea and wheezing.  Gastrointestinal: Denied, gastro-esophageal reflux, melena, nausea and vomiting.  Genitourinary: See HPI for additional information.  Musculoskeletal: Denied musculoskeletal symptoms, stiffness, swelling, muscle weakness and myalgia.  Dermatologic: Denied dermatology symptoms, rash and scar.  Neurologic: Denied neurology symptoms, dizziness, headache, neck pain and syncope.  Psychiatric: Denied psychiatric symptoms, anxiety and depression.  Endocrine: Denied endocrine symptoms including hot flashes and night sweats.   Meds:   Current Outpatient Prescriptions on File Prior to Visit  Medication Sig Dispense Refill  . fluticasone (FLONASE) 50 MCG/ACT nasal spray Place 2 sprays into both nostrils daily. 16 g 2  . ibuprofen (ADVIL,MOTRIN) 800 MG tablet   0  . omeprazole (PRILOSEC) 40 MG capsule Take 1 capsule (40 mg total) by mouth daily. 90 capsule 1   No current facility-administered medications on file prior to visit.     Objective:     Vitals:   02/01/17 1131  BP: 110/70  Pulse: 76              Pap smear and HPV discussed directly with the patient and results reviewed.  Assessment:    [id number] Patient Active Problem List   Diagnosis Date Noted  . GERD without esophagitis 03/15/2016  . Left ankle  pain 03/15/2016  . Right shoulder pain 03/15/2016  . Sensation of fullness in right ear 05/31/2015  . Dizziness of unknown cause 05/31/2015  . Allergic rhinitis, seasonal 05/21/2015  . Scoliosis 05/21/2015  . Arthralgia of temporomandibular joint 05/21/2015  . Acute sinusitis with symptoms greater than 10 days 05/21/2015      1. Low grade squamous intraepithelial lesion on cytologic smear of cervix (LGSIL)     With negative HPV   Plan:            1.  HPV I have discussed HPV with the patient in detail.  She is aware that HPV is a sexually transmitted disease and that it is estimated that approximately 2/3 of all sexually active people in this country have this virus.  I reviewed our basic understanding of this virus and its infectivity and transmission.  We have discussed the problems caused by this virus including external condyloma, vaginal and cervical condyloma, cervical dysplasia and its link to cervical cancer.  Treatment of external condyloma, internal condyloma and dysplasia has also been discussed.  The virus and its natural course and history were reviewed in detail.  The necessity for adequate follow-up including pap-smears, viral testing and possible Colposcopy with biopsies has been discussed.  A number of treatment options have also been reviewed.  The use of condoms to help decrease the likelihood of spread of this virus was discussed with the patient.  Literature on HPV virus was provided.  All of the patient's questions were answered.  We have discussed abnormal cervical cytology and its relationship to cervical cancer and HPV in detail. All of her questions were answered. Based on her age, history, and current findings I recommend a follow-up cytology with HPV in 1 year.        F/U  No Follow-up on file. I spent 32 minutes with this patient of which greater than 50% was spent discussing abnormal cervical cytology, HPV, continued cervical surveillance.  Finis Bud, M.D. 02/01/2017 1:36 PM

## 2017-02-01 NOTE — Telephone Encounter (Signed)
She can call it in when she runs out of refills.

## 2017-02-01 NOTE — Telephone Encounter (Signed)
PT SAID THAT SHE KNOWS THAT YOU WROTE HER A RX FOR 2 REFILLS ON HER FLONASE BUT SHE IS ASKING THAT HER REFILLS BE FOR MORE THAN 2 FOR THIS IS THE ONLY ONE SHE USES AND SHE DOES NOT WANT TO RUN OUT WITH IT ALLERGY SEASON. USES [facility] .

## 2017-02-02 LAB — COMPLETE METABOLIC PANEL WITH GFR
ALBUMIN: 4.1 g/dL (ref 3.6–5.1)
ALK PHOS: 88 U/L (ref 33–130)
ALT: 12 U/L (ref 6–29)
AST: 14 U/L (ref 10–35)
BUN: 15 mg/dL (ref 7–25)
CO2: 25 mmol/L (ref 20–31)
Calcium: 9.5 mg/dL (ref 8.6–10.4)
Chloride: 105 mmol/L (ref 98–110)
Creat: 0.67 mg/dL (ref 0.50–1.05)
GFR, Est African American: >89 mL/min (ref >=60)
GLUCOSE: 97 mg/dL (ref 65–99)
POTASSIUM: 4.4 mmol/L (ref 3.5–5.3)
SODIUM: 142 mmol/L (ref 135–146)
TOTAL PROTEIN: 7.5 g/dL (ref 6.1–8.1)
Total Bilirubin: 0.4 mg/dL (ref 0.2–1.2)

## 2017-02-02 LAB — INSULIN, FASTING: Insulin fasting, serum: 21.6 u[IU]/mL — ABNORMAL HIGH (ref 2.0–19.6)

## 2017-02-02 LAB — LIPID PANEL
Cholesterol: 168 mg/dL (ref <200)
HDL: 55 mg/dL (ref >50)
LDL CALC: 94 mg/dL (ref <100)
Total CHOL/HDL Ratio: 3.1 Ratio (ref <5.0)
Triglycerides: 94 mg/dL (ref <150)
VLDL: 19 mg/dL (ref <30)

## 2017-02-02 LAB — HEMOGLOBIN A1C
Hgb A1c MFr Bld: 5.4 % (ref <5.7)
MEAN PLASMA GLUCOSE: 108 mg/dL

## 2017-02-02 LAB — HIV ANTIBODY (ROUTINE TESTING W REFLEX): HIV: NONREACTIVE

## 2017-02-02 LAB — RPR

## 2017-02-05 ENCOUNTER — Telehealth: Payer: Self-pay | Admitting: Family Medicine

## 2017-02-05 NOTE — Telephone Encounter (Signed)
Patient was informed on her A1C results again, Makayla Mason went over her labs on Friday 02/02/17. Still awaiting the Insulin Level to be reviewed and documented on.

## 2017-02-05 NOTE — Telephone Encounter (Signed)
Pt would like results of her A1C if its back.

## 2017-02-08 ENCOUNTER — Ambulatory Visit (INDEPENDENT_AMBULATORY_CARE_PROVIDER_SITE_OTHER): Payer: 59 | Admitting: Obstetrics and Gynecology

## 2017-02-08 ENCOUNTER — Encounter: Payer: Self-pay | Admitting: Obstetrics and Gynecology

## 2017-02-08 VITALS — BP 119/77 | HR 84 | Wt 146.1 lb

## 2017-02-08 DIAGNOSIS — N898 Other specified noninflammatory disorders of vagina: Secondary | ICD-10-CM | POA: Diagnosis not present

## 2017-02-08 NOTE — Addendum Note (Signed)
Addended by: Raliegh Ip on: 02/08/2017 11:12 AM   Modules accepted: Orders

## 2017-02-08 NOTE — Progress Notes (Signed)
HPI:      [name] is a 51 y.o. [id number] who LMP was No LMP recorded (exact date). Patient is postmenopausal.  Subjective:   She presents today Complaining of vaginal irritation throughout the day. She reports that it is not vulvar but feels a little bit up inside. It is not disabling but just irritating. She denies vaginal discharge. She denies any issues with vaginal dryness or pain with intercourse. She has been menopausal for approximately 3 years.    Hx: The following portions of the patient's history were reviewed and updated as appropriate:              She  has a past medical history of Allergic rhinitis, seasonal; Mild scoliosis; and TMJ arthralgia. She  does not have any pertinent problems on file. She  has no past surgical history on file. Her family history includes Anemia in her maternal aunt; Asthma in her daughter; Cancer in her cousin; Diabetes in her father and mother; Heart disease in her maternal grandmother; Hypertension in her father and maternal grandmother; Kidney disease in her maternal grandmother; Stroke in her maternal aunt. She  reports that she has never smoked. She has never used smokeless tobacco. She reports that she drinks alcohol. She reports that she does not use drugs. Current Outpatient Prescriptions on File Prior to Visit  Medication Sig Dispense Refill  . fluticasone (FLONASE) 50 MCG/ACT nasal spray Place 2 sprays into both nostrils daily. 16 g 2  . ibuprofen (ADVIL,MOTRIN) 800 MG tablet   0  . omeprazole (PRILOSEC) 40 MG capsule Take 1 capsule (40 mg total) by mouth daily. 90 capsule 1   No current facility-administered medications on file prior to visit.          Review of Systems:  Review of Systems  Constitutional: Denied constitutional symptoms, night sweats, recent illness, fatigue, fever, insomnia and weight loss.  Eyes: Denied eye symptoms, eye pain, photophobia, vision change and visual disturbance.  Ears/Nose/Throat/Neck: Denied  ear, nose, throat or neck symptoms, hearing loss, nasal discharge, sinus congestion and sore throat.  Cardiovascular: Denied cardiovascular symptoms, arrhythmia, chest pain/pressure, edema, exercise intolerance, orthopnea and palpitations.  Respiratory: Denied pulmonary symptoms, asthma, pleuritic pain, productive sputum, cough, dyspnea and wheezing.  Gastrointestinal: Denied, gastro-esophageal reflux, melena, nausea and vomiting.  Genitourinary: See HPI for additional information.  Musculoskeletal: Denied musculoskeletal symptoms, stiffness, swelling, muscle weakness and myalgia.  Dermatologic: Denied dermatology symptoms, rash and scar.  Neurologic: Denied neurology symptoms, dizziness, headache, neck pain and syncope.  Psychiatric: Denied psychiatric symptoms, anxiety and depression.  Endocrine: Denied endocrine symptoms including hot flashes and night sweats.   Meds:   Current Outpatient Prescriptions on File Prior to Visit  Medication Sig Dispense Refill  . fluticasone (FLONASE) 50 MCG/ACT nasal spray Place 2 sprays into both nostrils daily. 16 g 2  . ibuprofen (ADVIL,MOTRIN) 800 MG tablet   0  . omeprazole (PRILOSEC) 40 MG capsule Take 1 capsule (40 mg total) by mouth daily. 90 capsule 1   No current facility-administered medications on file prior to visit.     Objective:     Vitals:   02/08/17 1011  BP: 119/77  Pulse: 84              Physical examination   Pelvic:   Vulva: Normal appearance.  No lesions.  Vagina: No lesions or abnormalities noted.  Somewhat erythematous with adherent white discharge   Support: Normal pelvic support.  Urethra No masses tenderness or scarring.  Meatus Normal size without lesions or prolapse.  Cervix: Normal appearance.  No lesions.  Anus: Normal exam.  No lesions.  Perineum: Normal exam.  No lesions.        Bimanual   Uterus: Normal size.  Non-tender.  Mobile.  AV.  Adnexae: No masses.  Non-tender to palpation.  Cul-de-sac: Negative  for abnormality.     Assessment:    [id number] Patient Active Problem List   Diagnosis Date Noted  . GERD without esophagitis 03/15/2016  . Left ankle pain 03/15/2016  . Right shoulder pain 03/15/2016  . Sensation of fullness in right ear 05/31/2015  . Dizziness of unknown cause 05/31/2015  . Allergic rhinitis, seasonal 05/21/2015  . Scoliosis 05/21/2015  . Arthralgia of temporomandibular joint 05/21/2015  . Acute sinusitis with symptoms greater than 10 days 05/21/2015     1. Vaginal irritation     Possible infectious causes versus atrophic vaginitis.   Plan:            1.  VG plus performed.  I will contact her with any abnormal results and treat appropriately.  2.  If VG negative consider trial of E2 cream QOD for 30 days       F/U  Return for We will contact her with any abnormal test results.  Finis Bud, M.D. 02/08/2017 10:39 AM

## 2017-02-12 LAB — NUSWAB VAGINITIS PLUS (VG+)
ATOPOBIUM VAGINAE: HIGH {score} — AB
CANDIDA ALBICANS, NAA: NEGATIVE
Candida glabrata, NAA: NEGATIVE
Chlamydia trachomatis, NAA: NEGATIVE
NEISSERIA GONORRHOEAE, NAA: NEGATIVE
Trich vag by NAA: NEGATIVE

## 2017-02-13 ENCOUNTER — Other Ambulatory Visit: Payer: Self-pay

## 2017-02-13 ENCOUNTER — Telehealth: Payer: Self-pay

## 2017-02-13 MED ORDER — METRONIDAZOLE 500 MG PO TABS: 500.0000 mg | ORAL_TABLET | Freq: Two times a day (BID) | ORAL | 0 refills | Status: DC

## 2017-02-13 NOTE — Telephone Encounter (Signed)
Patient is still having symptoms. Per provider- script for flagyl 500mg  #14 1 BID x7 days sent to pharmacy of choice.

## 2017-02-13 NOTE — Telephone Encounter (Signed)
-----   Message from Harlin Heys, MD sent at 02/13/2017 12:10 PM EDT ----- This result is equivocal. Please call her and if she is still symptomatic prescribe a 1 week course of Flagyl 500 mg twice a day.

## 2017-02-15 ENCOUNTER — Telehealth: Payer: Self-pay | Admitting: Family Medicine

## 2017-02-15 NOTE — Telephone Encounter (Signed)
Patient says that Dr Ancil Boozer was going to prescribe her some medication that will help elevate her A1C however the pharmacy has not received it. Please send to [facility]. Also she is asking for a return call [phone]

## 2017-02-19 ENCOUNTER — Other Ambulatory Visit: Payer: Self-pay | Admitting: Family Medicine

## 2017-02-19 MED ORDER — METFORMIN HCL ER 500 MG PO TB24: 500.0000 mg | ORAL_TABLET | Freq: Every day | ORAL | 2 refills | Status: DC

## 2017-02-19 NOTE — Telephone Encounter (Signed)
Please send in Metformin to pharmacy.

## 2017-02-22 ENCOUNTER — Ambulatory Visit
Admission: RE | Admit: 2017-02-22 | Discharge: 2017-02-22 | Disposition: A | Discharge status: Home / Self Care | Payer: 59 | Source: Ambulatory Visit | Attending: Family Medicine | Admitting: Family Medicine

## 2017-02-22 ENCOUNTER — Ambulatory Visit: Payer: 59

## 2017-02-22 DIAGNOSIS — Z1239 Encounter for other screening for malignant neoplasm of breast: Secondary | ICD-10-CM

## 2017-02-22 DIAGNOSIS — Z1231 Encounter for screening mammogram for malignant neoplasm of breast: Secondary | ICD-10-CM | POA: Insufficient documentation

## 2017-02-28 ENCOUNTER — Ambulatory Visit (INDEPENDENT_AMBULATORY_CARE_PROVIDER_SITE_OTHER): Payer: 59 | Admitting: Family Medicine

## 2017-02-28 ENCOUNTER — Encounter: Payer: Self-pay | Admitting: Family Medicine

## 2017-02-28 VITALS — BP 110/76 | HR 81 | Temp 97.4°F | Resp 16 | Ht 63.0 in | Wt 147.0 lb

## 2017-02-28 DIAGNOSIS — H938X1 Other specified disorders of right ear: Secondary | ICD-10-CM

## 2017-02-28 DIAGNOSIS — E88819 Insulin resistance, unspecified: Secondary | ICD-10-CM | POA: Insufficient documentation

## 2017-02-28 DIAGNOSIS — R87612 Low grade squamous intraepithelial lesion on cytologic smear of cervix (LGSIL): Secondary | ICD-10-CM | POA: Insufficient documentation

## 2017-02-28 DIAGNOSIS — R42 Dizziness and giddiness: Secondary | ICD-10-CM

## 2017-02-28 DIAGNOSIS — E8881 Metabolic syndrome: Secondary | ICD-10-CM

## 2017-02-28 NOTE — Progress Notes (Signed)
Name: Makayla Mason   MRN: [id number]    DOB: 02-22-1966   Date:02/28/2017       Progress Note  Subjective  Chief Complaint  Chief Complaint  Patient presents with  . Follow-up    1 month F/U  . Insulin Reaction    Abnormal Bloodwork-High Level of Insulin and would like to discuss bloodwork and medication    HPI  Insulin Resistance: she came in to discuss options. She denies polyphagia, polyuria or polyphagia. She is not interested in losing weight at this time. She never filled rx of Metformin. Discussed life style modification also.  Dizziness: she states that she feels like she is going to tip over if she bends forwards, she denies spinning sensation, episodes are brief, no hearing loss associated with dizziness. It happens a couple of times a month. Triggered by shifting position  Ear fullness: right side, not better with nasal spray, feels like there if fluid inside her ear. We will refer her to ENT.    Patient Active Problem List   Diagnosis Date Noted  . Insulin resistance 02/28/2017  . GERD without esophagitis 03/15/2016  . Left ankle pain 03/15/2016  . Right shoulder pain 03/15/2016  . Sensation of fullness in right ear 05/31/2015  . Dizziness of unknown cause 05/31/2015  . Allergic rhinitis, seasonal 05/21/2015  . Scoliosis 05/21/2015  . 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 No pertinent surgical history.  Family History  Problem Relation Age of Onset  . Breast cancer Cousin   . Diabetes Mother   . Diabetes Father   . Hypertension Father   . Asthma Daughter   . Anemia Maternal Aunt   . Stroke Maternal Aunt   . Heart disease Maternal Grandmother   . Kidney disease Maternal Grandmother   . Hypertension Maternal Grandmother     Social History   Social History  . Marital status: Divorced    Spouse name: N/A  . Number of children: N/A  . Years of education: N/A   Occupational History  . Not on file.   Social History  Main Topics  . Smoking status: Never Smoker  . Smokeless tobacco: Never Used  . Alcohol use 0.0 oz/week     Comment: occasional  . Drug use: No  . Sexual activity: Yes    Birth control/ protection: Post-menopausal   Other Topics Concern  . Not on file   Social History Narrative  . No narrative on file     Current Outpatient Prescriptions:  .  fluticasone (FLONASE) 50 MCG/ACT nasal spray, Place 2 sprays into both nostrils daily., Disp: 16 g, Rfl: 2 .  ibuprofen (ADVIL,MOTRIN) 800 MG tablet, , Disp: , Rfl: 0 .  metFORMIN (GLUCOPHAGE-XR) 500 MG 24 hr tablet, Take 1 tablet (500 mg total) by mouth daily with breakfast., Disp: 30 tablet, Rfl: 2 .  metroNIDAZOLE (FLAGYL) 500 MG tablet, Take 1 tablet (500 mg total) by mouth 2 (two) times daily., Disp: 14 tablet, Rfl: 0 .  omeprazole (PRILOSEC) 40 MG capsule, Take 1 capsule (40 mg total) by mouth daily., Disp: 90 capsule, Rfl: 1  Allergies  Allergen Reactions  . Codeine Nausea And Vomiting     ROS  Constitutional: Negative for fever or weight change.  Respiratory: Negative for cough and shortness of breath.   Cardiovascular: Negative for chest pain or palpitations.  Gastrointestinal: Negative for abdominal pain, no bowel changes.  Musculoskeletal: Negative for gait problem or joint swelling.  Skin: Negative for  rash.  Neurological: Positive  for dizziness, but no  headache.  No other specific complaints in a complete review of systems (except as listed in HPI above).  Objective  Vitals:   02/28/17 0920  BP: 110/76  Pulse: 81  Resp: 16  Temp: 97.4 F (36.3 C)  TempSrc: Oral  SpO2: 98%  Weight: 147 lb (66.7 kg)  Height: 5' 3"  (1.6 m)    Body mass index is 26.04 kg/m.  Physical Exam  Constitutional: Patient appears well-developed and well-nourished. Obese  No distress.  HEENT: head atraumatic, normocephalic, pupils equal and reactive to light, ears ***, neck supple, throat within normal limits Cardiovascular: Normal  rate, regular rhythm and normal heart sounds.  No murmur heard. No BLE edema. Pulmonary/Chest: Effort normal and breath sounds normal. No respiratory distress. Abdominal: Soft.  There is no tenderness. Psychiatric: Patient has a normal mood and affect. behavior is normal. Judgment and thought content normal.  Recent Results (from the past 2160 hour(s))  POCT HgB A1C     Status: None   Collection Time: 12/12/16 12:36 PM  Result Value Ref Range   Hemoglobin A1C 6.0   Pap IG, CT/NG NAA, and HPV (high risk)     Status: Abnormal   Collection Time: 01/25/17 12:19 PM  Result Value Ref Range   HPV DNA High Risk Not Detected     Comment: HIGH RISK HPV types (16,18,31,33,35,39,45,51,52,56,58,59,66,68) were not detected. Other HPV types which cause anogenital lesions may be present. The significance of the other types of HPV in malignant  processes has not been established.                  ** Normal Reference Range: Not Detected **      HPV High Risk testing performed using the APTIMA HPV mRNA Assay.      Specimen adequacy:      Comment: SATISFACTORY.  Endocervical/transformation zone component absent/insufficient.    FINAL DIAGNOSIS: (A)     Comment: - EPITHELIAL CELL ABNORMALITY:  SQUAMOUS CELLS LOW-GRADE SQUAMOUS INTRAEPITHELIAL LESION (LSIL) ENCOMPASSING:  HPV/MILD DYSPLASIA/CIN1.    COMMENTS:      Comment: This Pap test has been evaluated with computer assisted technology.   RECOMMENDATIONS:      Comment: FOLLOW-UP is suggested as clinically indicated. For a more comprehensive discussion of these recommendations, please refer to the [email or web address] website.    Cytotechnologist:      Comment: JRW, BS CT(ASCP)   Pathologist:      Comment: Reviewed by S. Jhonnie Garner, MD, (Electronic Signature on File)   Chlamydia Probe Amp NOT DETECTED     Comment:                    **Normal Reference Range: NOT DETECTED**   This test was performed using the APTIMA COMBO2 Assay  ([place].).   The analytical performance characteristics of this assay, when used to test SurePath specimens have been determined by [facility]      GC Probe Amp NOT DETECTED     Comment:                    **Normal Reference Range: NOT DETECTED**   This test was performed using the APTIMA COMBO2 Assay ([place].).   The analytical performance characteristics of this assay, when used to test SurePath specimens have been determined by [employer]   *  The Pap is a screening test for cervical cancer. It is not a  diagnostic test and is subject to false negative and false positive  results. It is most reliable when a satisfactory sample, regularly  obtained, is submitted with relevant clinical findings and history,  and when the Pap result is evaluated along with historic and current  clinical information.   COMPLETE METABOLIC PANEL WITH GFR     Status: None   Collection Time: 02/01/17  2:04 PM  Result Value Ref Range   Sodium 142 135 - 146 mmol/L   Potassium 4.4 3.5 - 5.3 mmol/L   Chloride 105 98 - 110 mmol/L   CO2 25 20 - 31 mmol/L   Glucose, Bld 97 65 - 99 mg/dL   BUN 15 7 - 25 mg/dL   Creat 0.67 0.50 - 1.05 mg/dL    Comment:   For patients > or = 53 years of age: The upper reference limit for Creatinine is approximately 13% higher for people identified as African-American.      Total Bilirubin 0.4 0.2 - 1.2 mg/dL   Alkaline Phosphatase 88 33 - 130 U/L   AST 14 10 - 35 U/L   ALT 12 6 - 29 U/L   Total Protein 7.5 6.1 - 8.1 g/dL   Albumin 4.1 3.6 - 5.1 g/dL   Calcium 9.5 8.6 - 10.4 mg/dL   GFR, Est African American >89 >=60 mL/min   GFR, Est Non African American >89 >=60 mL/min  CBC with Differential/Platelet     Status: None   Collection Time: 02/01/17  2:04 PM  Result Value Ref Range   WBC 8.3 3.8 - 10.8 K/uL   RBC 4.91 3.80 - 5.10 MIL/uL   Hemoglobin 14.1 11.7 - 15.5 g/dL   HCT 43.7 35.0 - 45.0 %   MCV 89.0 80.0 - 100.0 fL   MCH 28.7  27.0 - 33.0 pg   MCHC 32.3 32.0 - 36.0 g/dL   RDW 15.0 11.0 - 15.0 %   Platelets 358 140 - 400 K/uL   MPV 9.8 7.5 - 12.5 fL   Neutro Abs 4,150 1,500 - 7,800 cells/uL   Lymphs Abs 3,320 850 - 3,900 cells/uL   Monocytes Absolute 498 200 - 950 cells/uL   Eosinophils Absolute 332 15 - 500 cells/uL   Basophils Absolute 0 0 - 200 cells/uL   Neutrophils Relative % 50 %   Lymphocytes Relative 40 %   Monocytes Relative 6 %   Eosinophils Relative 4 %   Basophils Relative 0 %   Smear Review Criteria for review not met   Hemoglobin A1c     Status: None   Collection Time: 02/01/17  2:04 PM  Result Value Ref Range   Hgb A1c MFr Bld 5.4 <5.7 %    Comment:   For the purpose of screening for the presence of diabetes:   <5.7%       Consistent with the absence of diabetes 5.7-6.4 %   Consistent with increased risk for diabetes (prediabetes) >=6.5 %     Consistent with diabetes   This assay result is consistent with a decreased risk of diabetes.   Currently, no consensus exists regarding use of hemoglobin A1c for diagnosis of diabetes in children.   According to American Diabetes Association (ADA) guidelines, hemoglobin A1c <7.0% represents optimal control in non-pregnant diabetic patients. Different metrics may apply to specific patient populations. Standards of Medical Care in Diabetes (ADA).      Mean Plasma Glucose 108 mg/dL  Insulin, fasting     Status: Abnormal   Collection  Time: 02/01/17  2:04 PM  Result Value Ref Range   Insulin fasting, serum 21.6 (H) 2.0 - 19.6 uIU/mL    Comment:   This insulin assay shows strong cross-reactivity for some insulin analogs (lispro, aspart, and glargine) and much lower cross-reactivity with others (detemir, glulisine).   Stimulated Insulin reference intervals were established using the Siemens Immulite assay. These values are provided for general guidance only.   Lipid panel     Status: None   Collection Time: 02/01/17  2:04 PM  Result Value  Ref Range   Cholesterol 168 <200 mg/dL   Triglycerides 94 <150 mg/dL   HDL 55 >50 mg/dL   Total CHOL/HDL Ratio 3.1 <5.0 Ratio   VLDL 19 <30 mg/dL   LDL Cholesterol 94 <100 mg/dL  HIV antibody     Status: None   Collection Time: 02/01/17  2:07 PM  Result Value Ref Range   HIV 1&2 Ab, 4th Generation NONREACTIVE NONREACTIVE    Comment:   HIV-1 antigen and HIV-1/HIV-2 antibodies were not detected.  There is no laboratory evidence of HIV infection.   HIV-1/2 Antibody Diff        Not indicated. HIV-1 RNA, Qual TMA          Not indicated.     PLEASE NOTE: This information has been disclosed to you from records whose confidentiality may be protected by state law. If your state requires such protection, then the state law prohibits you from making any further disclosure of the information without the specific written consent of the person to whom it pertains, or as otherwise permitted by law. A general authorization for the release of medical or other information is NOT sufficient for this purpose.   The performance of this assay has not been clinically validated in patients less than 83 years old.   For additional information please refer to http://education.[facility].com/faq/FAQ106.  (This link is being provided for informational/educational purposes only.)     RPR     Status: None   Collection Time: 02/01/17  2:07 PM  Result Value Ref Range   RPR Ser Ql NON REAC NON REAC  NuSwab Vaginitis Plus (VG+)     Status: Abnormal   Collection Time: 02/08/17 12:21 PM  Result Value Ref Range   Atopobium vaginae High - 2 (A) Score   BVAB 2 Low - 0 Score   Megasphaera 1 Low - 0 Score    Comment: Calculate total score by adding the 3 individual bacterial vaginosis (BV) marker scores together.  Total score is interpreted as follows: Total score 0-1: Indicates the absence of BV. Total score   2: Indeterminate for BV. Additional clinical                  data should be evaluated to  establish a                  diagnosis. Total score 3-6: Indicates the presence of BV. This test was developed and its performance characteristics determined by [facility].  It has not been cleared or approved by the Food and Drug Administration.  The FDA has determined that such clearance or approval is not necessary.    Candida albicans, NAA Negative Negative   Candida glabrata, NAA Negative Negative    Comment: This test was developed and its performance characteristics determined by [facility].  It has not been cleared or approved by the Food and Drug Administration.  The FDA has determined that such clearance or approval is  not necessary.    Trich vag by NAA Negative Negative   Chlamydia trachomatis, NAA Negative Negative   Neisseria gonorrhoeae, NAA Negative Negative     PHQ2/9: Depression screen [facility] 2/9 01/25/2017 03/15/2016 05/21/2015  Decreased Interest 0 0 0  Down, Depressed, Hopeless 0 0 0  PHQ - 2 Score 0 0 0    Fall Risk: Fall Risk  01/25/2017 03/15/2016 05/21/2015  Falls in the past year? No No No     Assessment & Plan  1. Insulin resistance  Advised to follow a diabetic diet, exercise more and fill rx of Metformin  She drinks [facility] one can daily, and she will stop doing that and will replace with water  2. Orthostatic dizziness  Advised to stay hydrated, and get up slowly, it does not seem to be ear related  3. Ear fullness, right  - Ambulatory referral to ENT

## 2017-02-28 NOTE — Patient Instructions (Signed)
Insulin Resistance Insulin is a hormone that helps to control blood sugar (glucose) levels in the body. It is made in the pancreas. Insulin allows glucose to enter cells in the body. Insulin sensitivity refers to how the body responds to insulin. Insulin resistance occurs when cells in the body do not respond properly to insulin made by the pancreas and are not able to absorb glucose from the bloodstream. Insulin resistance results in high blood glucose levels (hyperglycemia) and can lead to problems, including:  Prediabetes.  Type 2 diabetes (type 2 diabetes mellitus).  Heart disease.  High blood pressure (hypertension).  Stroke.  Polycystic ovarian syndrome (PCOS).  Nonalcoholic fatty liver disease. What are the causes? The exact cause of insulin resistance is not known. What increases the risk? The following factors may make you more likely to develop insulin resistance:  Being overweight or obese, especially if a lot of your weight is in your waist area.  Having an inactive (sedentary) lifestyle.  Using steroids.  Being older than 42.  Having sleep apnea.  Using tobacco products. What are the signs or symptoms? This condition usually does not cause symptoms. How is this diagnosed? There is no test to diagnose insulin resistance. However, your health care provider may diagnose insulin resistance based on:  Your blood glucose levels.  Your cholesterol levels.  A measurement of the distance around your waist (circumference). A waist circumference of more than 35 inches (88.9 cm) for women and more than 40 inches (101.6 cm) for men may be a sign of insulin resistance.  Your risk factors.  A physical exam.  Your medical history. How is this treated? Insulin resistance is treated with nutrition and lifestyle changes. These changes may include:  Eating a healthy balance of nutritious foods.  Getting more physical activity.  Maintaining a healthy  weight.  Stopping the use of any tobacco products. Your health care provider will work with you to change your nutrition and lifestyle as needed. In some cases, treatment may also include medicine to improve your insulin sensitivity. Follow these instructions at home:  Be physically active.  Do moderate-intensity physical activity for at least 30 minutes on at least 5 days of the week, or as much as told by your health care provider. This could be brisk walking, biking, or water aerobics.  Ask your health care provider what activities are safe for you. A mix of physical activities may be best, such as walking, swimming, cycling, and strength training.  Lose weight as told by your health care provider.  Losing 5-7% of your body weight can reverse insulin resistance.  Your health care provider can determine how much weight loss is best for you and can help you lose weight safely.  Follow a healthy meal plan. This includes eating lean proteins, complex carbohydrates, fresh fruits and vegetables, low-fat dairy products, and healthy fats.  Follow instructions from your health care provider about eating or drinking restrictions.  Make an appointment to see a diet and nutrition specialist (registered dietitian) to help you create a healthy eating plan.  Check your blood glucose levels as told by your health care provider.  Take over-the-counter and prescription medicines only as told by your health care provider.  Do not use any tobacco products, such as cigarettes, chewing tobacco, and e-cigarettes. If you need help quitting, ask your health care provider.  Keep all follow-up visits as told by your health care provider. This is important. Contact a health care provider if:  You have  trouble losing weight or maintaining your goal weight.  You gain weight.  You have trouble following your prescribed meal plan.  You have trouble exercising more. This information is not intended to  replace advice given to you by your health care provider. Make sure you discuss any questions you have with your health care provider. Document Released: 12/12/2005 Document Revised: 03/30/2016 Document Reviewed: 11/26/2015 Elsevier Interactive Patient Education  2017 [employer].

## 2017-04-04 ENCOUNTER — Encounter: Payer: Self-pay | Admitting: Family Medicine

## 2017-04-04 ENCOUNTER — Ambulatory Visit (INDEPENDENT_AMBULATORY_CARE_PROVIDER_SITE_OTHER): Payer: 59 | Admitting: Family Medicine

## 2017-04-04 VITALS — BP 110/72 | HR 87 | Temp 98.0°F | Resp 14 | Wt 142.9 lb

## 2017-04-04 DIAGNOSIS — E8881 Metabolic syndrome: Secondary | ICD-10-CM | POA: Diagnosis not present

## 2017-04-04 DIAGNOSIS — J0120 Acute ethmoidal sinusitis, unspecified: Secondary | ICD-10-CM | POA: Diagnosis not present

## 2017-04-04 LAB — GLUCOSE, POCT (MANUAL RESULT ENTRY): POC Glucose: 63 mg/dl — AB (ref 70–99)

## 2017-04-04 MED ORDER — FLUCONAZOLE 150 MG PO TABS: 150.0000 mg | ORAL_TABLET | Freq: Once | ORAL | 0 refills | Status: AC

## 2017-04-04 MED ORDER — AMOXICILLIN-POT CLAVULANATE 875-125 MG PO TABS: 1.0000 | ORAL_TABLET | Freq: Two times a day (BID) | ORAL | 0 refills | Status: AC

## 2017-04-04 NOTE — Progress Notes (Signed)
BP 110/72   Pulse 87   Temp 98 F (36.7 C) (Oral)   Resp 14   Wt 142 lb 14.4 oz (64.8 kg)   SpO2 96%   BMI 25.31 kg/m    Subjective:    Patient ID: Makayla Mason, female    DOB: [birth year], 51 y.o.   MRN: [id number]  HPI: Makayla Mason is a 51 y.o. female  Chief Complaint  Patient presents with  . 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 Sinus congestion across the front on both sides No fevers, but getting hot and cold chills Not blowing out much; feels like stuff is up there No tooth pain Right ear has fluid in it sometimes; turning head over, like something in slow motion; grandmother had vertigo  She would also like her glucose checked; her primary has monitored her A1c and insulin levels Lab Results  Component Value Date   HGBA1C 5.4 02/01/2017   Depression screen [facility] 2/9 04/04/2017 01/25/2017 03/15/2016 05/21/2015  Decreased Interest 0 0 0 0  Down, Depressed, Hopeless 0 0 0 0  PHQ - 2 Score 0 0 0 0   Relevant past medical, surgical, family and social history reviewed Past Medical History:  Diagnosis Date  . Allergic rhinitis, seasonal   . Mild scoliosis   . TMJ arthralgia    right side   No past surgical history on file.   Family History  Problem Relation Age of Onset  . Breast cancer Cousin   . Diabetes Mother   . Diabetes Father   . Hypertension Father   . Asthma Daughter   . Anemia Maternal Aunt   . Stroke Maternal Aunt   . Heart disease Maternal Grandmother   . Kidney disease Maternal Grandmother   . Hypertension Maternal Grandmother    Social History   Social History  . Marital status: Divorced    Spouse name: N/A  . Number of children: N/A  . Years of education: N/A   Occupational History  . Not on file.   Social History Main Topics  . Smoking status: Never Smoker  . Smokeless tobacco: Never Used  . Alcohol use 0.0 oz/week     Comment: occasional  . Drug  use: No  . Sexual activity: Yes    Birth control/ protection: Post-menopausal   Other Topics Concern  . Not on file   Social History Narrative  . No narrative on file   Interim medical history since last visit reviewed. Allergies and medications reviewed  Review of Systems Per HPI unless specifically indicated above     Objective:    BP 110/72   Pulse 87   Temp 98 F (36.7 C) (Oral)   Resp 14   Wt 142 lb 14.4 oz (64.8 kg)   SpO2 96%   BMI 25.31 kg/m   Wt Readings from Last 3 Encounters:  04/04/17 142 lb 14.4 oz (64.8 kg)  02/28/17 147 lb (66.7 kg)  02/08/17 146 lb 2 oz (66.3 kg)    Physical Exam  Constitutional: She appears well-developed and well-nourished.  HENT:  Right Ear: Tympanic membrane, external ear and ear canal normal. Tympanic membrane is not erythematous. No middle ear effusion.  Left Ear: Tympanic membrane, external ear and ear canal normal. Tympanic membrane is not erythematous.  No middle ear effusion.  Nose: Mucosal edema, rhinorrhea and sinus tenderness present. Right sinus exhibits no  maxillary sinus tenderness and no frontal sinus tenderness. Left sinus exhibits no maxillary sinus tenderness and no frontal sinus tenderness.  Mouth/Throat: Oropharynx is clear and moist and mucous membranes are normal. No oropharyngeal exudate, posterior oropharyngeal edema or posterior oropharyngeal erythema.  Some tenderness with palpation over bridge of nose; no erythema  Eyes: EOM are normal. No scleral icterus.  Cardiovascular: Normal rate and regular rhythm.   Pulmonary/Chest: Effort normal and breath sounds normal.  Lymphadenopathy:    She has no cervical adenopathy.  Psychiatric: She has a normal mood and affect. Her mood appears not anxious. She does not exhibit a depressed mood.      Assessment & Plan:   Problem List Items Addressed This Visit      Other   Insulin resistance    Patient requested fasting glucose today; last A1c was excellent; avoid  simple carbs      Relevant Orders   POCT Glucose (CBG) (Completed)    Other Visit Diagnoses    Acute non-recurrent ethmoidal sinusitis    -  Primary   explained dx; start antibiotics; rest, hydration, c diff precautions; out of work today   Relevant Medications   amoxicillin-clavulanate (AUGMENTIN) 875-125 MG tablet   fluconazole (DIFLUCAN) 150 MG tablet       Follow up plan: No Follow-up on file.  An after-visit summary was printed and given to the patient at [place].  Please see the patient instructions which may contain other information and recommendations beyond what is mentioned above in the assessment and plan.  Meds ordered this encounter  Medications  . amoxicillin-clavulanate (AUGMENTIN) 875-125 MG tablet    Sig: Take 1 tablet by mouth 2 (two) times daily.    Dispense:  20 tablet    Refill:  0  . fluconazole (DIFLUCAN) 150 MG tablet    Sig: Take 1 tablet (150 mg total) by mouth once.    Dispense:  1 tablet    Refill:  0    May leave on file in case patient needs this    Orders Placed This Encounter  Procedures  . POCT Glucose (CBG)

## 2017-04-04 NOTE — Assessment & Plan Note (Signed)
Patient requested fasting glucose today; last A1c was excellent; avoid simple carbs

## 2017-04-04 NOTE — Patient Instructions (Addendum)
Start the antibiotics Please do eat yogurt daily or take a probiotic daily for the next month We want to replace the healthy germs in the gut If you notice foul, watery diarrhea in the next two months, schedule an appointment RIGHT AWAY   Sinusitis, Adult Sinusitis is soreness and inflammation of your sinuses. Sinuses are hollow spaces in the bones around your face. Your sinuses are located:  Around your eyes.  In the middle of your forehead.  Behind your nose.  In your cheekbones. Your sinuses and nasal passages are lined with a stringy fluid (mucus). Mucus normally drains out of your sinuses. When your nasal tissues become inflamed or swollen, the mucus can become trapped or blocked so air cannot flow through your sinuses. This allows bacteria, viruses, and funguses to grow, which leads to infection. Sinusitis can develop quickly and last for 7?10 days (acute) or for more than 12 weeks (chronic). Sinusitis often develops after a cold. What are the causes? This condition is caused by anything that creates swelling in the sinuses or stops mucus from draining, including:  Allergies.  Asthma.  Bacterial or viral infection.  Abnormally shaped bones between the nasal passages.  Nasal growths that contain mucus (nasal polyps).  Narrow sinus openings.  Pollutants, such as chemicals or irritants in the air.  A foreign object stuck in the nose.  A fungal infection. This is rare. What increases the risk? The following factors may make you more likely to develop this condition:  Having allergies or asthma.  Having had a recent cold or respiratory tract infection.  Having structural deformities or blockages in your nose or sinuses.  Having a weak immune system.  Doing a lot of swimming or diving.  Overusing nasal sprays.  Smoking. What are the signs or symptoms? The main symptoms of this condition are pain and a feeling of pressure around the affected sinuses. Other  symptoms include:  Upper toothache.  Earache.  Headache.  Bad breath.  Decreased sense of smell and taste.  A cough that may get worse at night.  Fatigue.  Fever.  Thick drainage from your nose. The drainage is often green and it may contain pus (purulent).  Stuffy nose or congestion.  Postnasal drip. This is when extra mucus collects in the throat or back of the nose.  Swelling and warmth over the affected sinuses.  Sore throat.  Sensitivity to light. How is this diagnosed? This condition is diagnosed based on symptoms, a medical history, and a physical exam. To find out if your condition is acute or chronic, your health care provider may:  Look in your nose for signs of nasal polyps.  Tap over the affected sinus to check for signs of infection.  View the inside of your sinuses using an imaging device that has a light attached (endoscope). If your health care provider suspects that you have chronic sinusitis, you may also:  Be tested for allergies.  Have a sample of mucus taken from your nose (nasal culture) and checked for bacteria.  Have a mucus sample examined to see if your sinusitis is related to an allergy. If your sinusitis does not respond to treatment and it lasts longer than 8 weeks, you may have an MRI or CT scan to check your sinuses. These scans also help to determine how severe your infection is. In rare cases, a bone biopsy may be done to rule out more serious types of fungal sinus disease. How is this treated? Treatment for sinusitis  depends on the cause and whether your condition is chronic or acute. If a virus is causing your sinusitis, your symptoms will go away on their own within 10 days. You may be given medicines to relieve your symptoms, including:  Topical nasal decongestants. They shrink swollen nasal passages and let mucus drain from your sinuses.  Antihistamines. These drugs block inflammation that is triggered by allergies. This can  help to ease swelling in your nose and sinuses.  Topical nasal corticosteroids. These are nasal sprays that ease inflammation and swelling in your nose and sinuses.  Nasal saline washes. These rinses can help to get rid of thick mucus in your nose. If your condition is caused by bacteria, you will be given an antibiotic medicine. If your condition is caused by a fungus, you will be given an antifungal medicine. Surgery may be needed to correct underlying conditions, such as narrow nasal passages. Surgery may also be needed to remove polyps. Follow these instructions at home: Medicines   Take, use, or apply over-the-counter and prescription medicines only as told by your health care provider. These may include nasal sprays.  If you were prescribed an antibiotic medicine, take it as told by your health care provider. Do not stop taking the antibiotic even if you start to feel better. Hydrate and Humidify   Drink enough water to keep your urine clear or pale yellow. Staying hydrated will help to thin your mucus.  Use a cool mist humidifier to keep the humidity level in your home above 50%.  Inhale steam for 10-15 minutes, 3-4 times a day or as told by your health care provider. You can do this in the bathroom while a hot shower is running.  Limit your exposure to cool or dry air. Rest   Rest as much as possible.  Sleep with your head raised (elevated).  Make sure to get enough sleep each night. General instructions   Apply a warm, moist washcloth to your face 3-4 times a day or as told by your health care provider. This will help with discomfort.  Wash your hands often with soap and water to reduce your exposure to viruses and other germs. If soap and water are not available, use hand sanitizer.  Do not smoke. Avoid being around people who are smoking (secondhand smoke).  Keep all follow-up visits as told by your health care provider. This is important. Contact a health care  provider if:  You have a fever.  Your symptoms get worse.  Your symptoms do not improve within 10 days. Get help right away if:  You have a severe headache.  You have persistent vomiting.  You have pain or swelling around your face or eyes.  You have vision problems.  You develop confusion.  Your neck is stiff.  You have trouble breathing. This information is not intended to replace advice given to you by your health care provider. Make sure you discuss any questions you have with your health care provider. Document Released: 10/23/2005 Document Revised: 06/18/2016 Document Reviewed: 08/18/2015 Elsevier Interactive Patient Education  2017 [employer].

## 2017-04-19 DIAGNOSIS — H5203 Hypermetropia, bilateral: Secondary | ICD-10-CM | POA: Diagnosis not present

## 2017-05-31 ENCOUNTER — Encounter: Payer: Self-pay | Admitting: Family Medicine

## 2017-05-31 ENCOUNTER — Ambulatory Visit (INDEPENDENT_AMBULATORY_CARE_PROVIDER_SITE_OTHER): Payer: 59 | Admitting: Family Medicine

## 2017-05-31 VITALS — BP 110/68 | HR 86 | Temp 97.8°F | Resp 16 | Ht 63.0 in | Wt 145.1 lb

## 2017-05-31 DIAGNOSIS — R7303 Prediabetes: Secondary | ICD-10-CM

## 2017-05-31 DIAGNOSIS — J302 Other seasonal allergic rhinitis: Secondary | ICD-10-CM

## 2017-05-31 DIAGNOSIS — Z1211 Encounter for screening for malignant neoplasm of colon: Secondary | ICD-10-CM

## 2017-05-31 DIAGNOSIS — K219 Gastro-esophageal reflux disease without esophagitis: Secondary | ICD-10-CM | POA: Diagnosis not present

## 2017-05-31 DIAGNOSIS — E8881 Metabolic syndrome: Secondary | ICD-10-CM

## 2017-05-31 MED ORDER — FLUTICASONE PROPIONATE 50 MCG/ACT NA SUSP: 2.0000 | Freq: Every day | NASAL | 2 refills | Status: DC

## 2017-05-31 MED ORDER — METFORMIN HCL ER 500 MG PO TB24: 500.0000 mg | ORAL_TABLET | Freq: Every day | ORAL | 1 refills | Status: DC

## 2017-05-31 MED ORDER — OMEPRAZOLE 40 MG PO CPDR: 40.0000 mg | DELAYED_RELEASE_CAPSULE | Freq: Every day | ORAL | 1 refills | Status: DC

## 2017-05-31 MED ORDER — LEVOCETIRIZINE DIHYDROCHLORIDE 5 MG PO TABS: 5.0000 mg | ORAL_TABLET | Freq: Every evening | ORAL | 1 refills | Status: DC

## 2017-05-31 NOTE — Progress Notes (Signed)
Name: Makayla Mason   MRN: [id number]    DOB: 07/30/1966   Date:05/31/2017       Progress Note  Subjective  Chief Complaint  Chief Complaint  Patient presents with  . Medication Refill    3 month F/U  . Gastroesophageal Reflux    Well controlled with medication, takes as needed  . Nasal Congestion    Onset-2-3 weeks, congestion, right ear pressure, coughing.   . Insulin Resistance    Would like to discuss Trulicity    HPI  Insulin Resistance: she came in to discuss options. She denies polyphagia, polyuria or polyphagia. Discussed life style modification also. HgbA1C 6.0 went down to 5.3% , but she would like to have labs done and start medication, she asked for GLP-1 agonist but I advised to try Metformin instead. She agreed, discussed possible side effects   GERD: she takes Omeprazole daily, and symptoms are controlled, no heartburn or regurgitation, avoiding fried food and spicy food. Discussed long term use of PPI, she is now taking it prn, discussed switching to Ranitidine, but she is still taking PPI  Seasonal allergic rhinitis: she has nasal congestion, post-nasal drainage, dry cough, burning sensation in her eyes, she has been out of nasal spray and is not taking oral medications. She has to clear her throat.   Patient Active Problem List   Diagnosis Date Noted  . Insulin resistance 02/28/2017  . Low grade squamous intraepith lesion on cytologic smear cervix (lgsil) 02/28/2017  . GERD without esophagitis 03/15/2016  . Left ankle pain 03/15/2016  . Allergic rhinitis, seasonal 05/21/2015  . Scoliosis 05/21/2015  . 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 No pertinent surgical history.  Family History  Problem Relation Age of Onset  . Breast cancer Cousin   . Diabetes Mother   . Diabetes Father   . Hypertension Father   . Asthma Daughter   . Anemia Maternal Aunt   . Stroke Maternal Aunt   . Heart disease Maternal Grandmother   .  Kidney disease Maternal Grandmother   . Hypertension Maternal Grandmother     Social History   Social History  . Marital status: Divorced    Spouse name: N/A  . Number of children: N/A  . Years of education: N/A   Occupational History  . Not on file.   Social History Main Topics  . Smoking status: Never Smoker  . Smokeless tobacco: Never Used  . Alcohol use 0.0 oz/week     Comment: occasional  . Drug use: No  . Sexual activity: Yes    Birth control/ protection: Post-menopausal   Other Topics Concern  . Not on file   Social History Narrative  . No narrative on file     Current Outpatient Prescriptions:  .  fluticasone (FLONASE) 50 MCG/ACT nasal spray, Place 2 sprays into both nostrils daily., Disp: 16 g, Rfl: 2 .  omeprazole (PRILOSEC) 40 MG capsule, Take 1 capsule (40 mg total) by mouth daily., Disp: 90 capsule, Rfl: 1 .  levocetirizine (XYZAL) 5 MG tablet, Take 1 tablet (5 mg total) by mouth every evening., Disp: 90 tablet, Rfl: 1 .  metFORMIN (GLUCOPHAGE-XR) 500 MG 24 hr tablet, Take 1 tablet (500 mg total) by mouth daily with breakfast., Disp: 90 tablet, Rfl: 1  Allergies  Allergen Reactions  . Codeine Nausea And Vomiting     ROS  Constitutional: Negative for fever or weight change.  Respiratory: Positive  For mild  cough no shortness  of breath.   Cardiovascular: Negative for chest pain or palpitations.  Gastrointestinal: Negative for abdominal pain, no bowel changes.  Musculoskeletal: Negative for gait problem or joint swelling.  Skin: Negative for rash.  Neurological: Negative for dizziness or headache.  No other specific complaints in a complete review of systems (except as listed in HPI above).  Objective  Vitals:   05/31/17 0914  BP: 110/68  Pulse: 86  Resp: 16  Temp: 97.8 F (36.6 C)  TempSrc: Oral  SpO2: 96%  Weight: 145 lb 1.6 oz (65.8 kg)  Height: 5\' 3"  (1.6 m)    Body mass index is 25.7 kg/m.  Physical Exam  Constitutional:  Patient appears well-developed and well-nourished. Overweight  No distress.  HEENT: head atraumatic, normocephalic, pupils equal and reactive to light, , neck supple, throat within normal limits Cardiovascular: Normal rate, regular rhythm and normal heart sounds.  No murmur heard. No BLE edema. Pulmonary/Chest: Effort normal and breath sounds normal. No respiratory distress. Abdominal: Soft.  There is no tenderness. Psychiatric: Patient has a normal mood and affect. behavior is normal. Judgment and thought content normal.  Recent Results (from the past 2160 hour(s))  POCT Glucose (CBG)     Status: Normal   Collection Time: 04/04/17 11:06 AM  Result Value Ref Range   POC Glucose 63 (A) 70 - 99 mg/dl     PHQ2/9: Depression screen [facility] 2/9 04/04/2017 01/25/2017 03/15/2016 05/21/2015  Decreased Interest 0 0 0 0  Down, Depressed, Hopeless 0 0 0 0  PHQ - 2 Score 0 0 0 0    Fall Risk: Fall Risk  04/04/2017 01/25/2017 03/15/2016 05/21/2015  Falls in the past year? No No No No     Assessment & Plan  1. Insulin resistance  - metFORMIN (GLUCOPHAGE-XR) 500 MG 24 hr tablet; Take 1 tablet (500 mg total) by mouth daily with breakfast.  Dispense: 90 tablet; Refill: 1 - Hemoglobin A1c - Insulin, fasting  2. Prediabetes  - metFORMIN (GLUCOPHAGE-XR) 500 MG 24 hr tablet; Take 1 tablet (500 mg total) by mouth daily with breakfast.  Dispense: 90 tablet; Refill: 1  3. GERD without esophagitis  - omeprazole (PRILOSEC) 40 MG capsule; Take 1 capsule (40 mg total) by mouth daily.  Dispense: 90 capsule; Refill: 1  4. Seasonal allergic rhinitis, unspecified trigger  - fluticasone (FLONASE) 50 MCG/ACT nasal spray; Place 2 sprays into both nostrils daily.  Dispense: 16 g; Refill: 2 - levocetirizine (XYZAL) 5 MG tablet; Take 1 tablet (5 mg total) by mouth every evening.  Dispense: 90 tablet; Refill: 1  5. Colon cancer screening  - Cologuard

## 2017-06-01 LAB — HEMOGLOBIN A1C
Hgb A1c MFr Bld: 5.6 % (ref <5.7)
MEAN PLASMA GLUCOSE: 114 mg/dL

## 2017-06-01 LAB — INSULIN, FASTING: Insulin fasting, serum: 5.8 u[IU]/mL (ref 2.0–19.6)

## 2017-06-29 ENCOUNTER — Other Ambulatory Visit: Payer: Self-pay

## 2017-10-15 ENCOUNTER — Emergency Department
Admission: EM | Admit: 2017-10-15 | Discharge: 2017-10-15 | Disposition: A | Discharge status: Home / Self Care | Payer: 59 | Attending: Emergency Medicine | Admitting: Emergency Medicine

## 2017-10-15 DIAGNOSIS — Z79899 Other long term (current) drug therapy: Secondary | ICD-10-CM | POA: Insufficient documentation

## 2017-10-15 DIAGNOSIS — K029 Dental caries, unspecified: Secondary | ICD-10-CM | POA: Insufficient documentation

## 2017-10-15 DIAGNOSIS — K0889 Other specified disorders of teeth and supporting structures: Secondary | ICD-10-CM | POA: Diagnosis not present

## 2017-10-15 LAB — URINALYSIS, COMPLETE (UACMP) WITH MICROSCOPIC
Bilirubin Urine: NEGATIVE
Glucose, UA: NEGATIVE mg/dL
Hgb urine dipstick: NEGATIVE
KETONES UR: NEGATIVE mg/dL
Nitrite: NEGATIVE
PH: 6 (ref 5.0–8.0)
PROTEIN: NEGATIVE mg/dL
Specific Gravity, Urine: 1.018 (ref 1.005–1.030)

## 2017-10-15 MED ORDER — CEPHALEXIN 500 MG PO CAPS: 500.0000 mg | ORAL_CAPSULE | Freq: Four times a day (QID) | ORAL | 0 refills | Status: AC

## 2017-10-15 MED ORDER — LIDOCAINE VISCOUS 2 % MT SOLN
15.0000 mL | Freq: Once | OROMUCOSAL | Status: AC
  Administered 2017-10-15: 15 mL via OROMUCOSAL
  Filled 2017-10-15: qty 15

## 2017-10-15 MED ORDER — TRAMADOL HCL 50 MG PO TABS: 50.0000 mg | ORAL_TABLET | Freq: Four times a day (QID) | ORAL | 0 refills | Status: DC | PRN

## 2017-10-15 MED ORDER — LIDOCAINE VISCOUS 2 % MT SOLN: 20.0000 mL | OROMUCOSAL | 0 refills | Status: DC | PRN

## 2017-10-15 MED ORDER — CEPHALEXIN 500 MG PO CAPS
500.0000 mg | ORAL_CAPSULE | Freq: Once | ORAL | Status: AC
  Administered 2017-10-15: 500 mg via ORAL
  Filled 2017-10-15: qty 1

## 2017-10-15 MED ORDER — ACETAMINOPHEN 325 MG PO TABS
650.0000 mg | ORAL_TABLET | Freq: Once | ORAL | Status: AC
  Administered 2017-10-15: 650 mg via ORAL
  Filled 2017-10-15: qty 2

## 2017-10-15 NOTE — Discharge Instructions (Signed)
Please take your antibiotics and follow up with your dentist. For removal of your tooth.

## 2017-10-15 NOTE — ED Triage Notes (Signed)
Patient c/o dental pain and facial swelling beginning Saturday. Patient denies fever/chills, N/V.

## 2017-10-15 NOTE — ED Triage Notes (Signed)
Patient reports she had a tooth pulled upper left jaw last week

## 2017-10-15 NOTE — ED Triage Notes (Signed)
Patient has significant swelling to left face

## 2017-10-15 NOTE — ED Provider Notes (Signed)
[facility] Emergency Department Provider Note   ____________________________________________   First MD Initiated Contact with Patient 10/15/17 [phone]     (approximate)  I have reviewed the triage vital signs and the nursing notes.   HISTORY  Chief Complaint Dental Pain    HPI Makayla Mason is a 51 y.o. female who comes into the hospital today with some left facial swelling.  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  When she woke up this morning she had some swelling to the left side of her face.  The patient had a tooth pulled on the right and reports that the dentist wanted to have teeth pulled on both sides of her face.  The patient also states that her blood pressure was elevated at 140/90 and her temperature was 98.  She also was concerned because she was urinating frequently.  The patient states that her pain is a 8 out of 10 in intensity.  She states that it was not bothering her too much but she did take some ibuprofen 800 mg at 930.  That tooth is on the left maxillary jaw.  She is been putting Orajel on it.  The patient came into the hospital today for evaluation and treatment.   Past Medical History:  Diagnosis Date  . Allergic rhinitis, seasonal   . Mild scoliosis   . TMJ arthralgia    right side    Patient Active Problem List   Diagnosis Date Noted  . Insulin resistance 02/28/2017  . Low grade squamous intraepith lesion on cytologic smear cervix (lgsil) 02/28/2017  . GERD without esophagitis 03/15/2016  . Left ankle pain 03/15/2016  . Allergic rhinitis, seasonal 05/21/2015  . Scoliosis 05/21/2015  . 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 No pertinent surgical history.  Prior to Admission medications   Medication Sig Start Date End Date Taking? Authorizing Provider  fluticasone (FLONASE) 50 MCG/ACT nasal spray Place 2 sprays into both nostrils daily. 05/31/17  Yes  Sowles, Drue Stager, MD  levocetirizine (XYZAL) 5 MG tablet Take 1 tablet (5 mg total) by mouth every evening. 05/31/17  Yes Sowles, Drue Stager, MD  metFORMIN (GLUCOPHAGE-XR) 500 MG 24 hr tablet Take 1 tablet (500 mg total) by mouth daily with breakfast. 05/31/17  Yes Sowles, Drue Stager, MD  cephALEXin (KEFLEX) 500 MG capsule Take 1 capsule (500 mg total) by mouth 4 (four) times daily for 10 days. 10/15/17 10/25/17  Loney Hering, MD  lidocaine (XYLOCAINE) 2 % solution Use as directed 20 mLs in the mouth or throat as needed for mouth pain. 10/15/17   Loney Hering, MD  omeprazole (PRILOSEC) 40 MG capsule Take 1 capsule (40 mg total) by mouth daily. 05/31/17   Steele Sizer, MD  traMADol (ULTRAM) 50 MG tablet Take 1 tablet (50 mg total) by mouth every 6 (six) hours as needed. 10/15/17   Loney Hering, MD    Allergies Codeine  Family History  Problem Relation Age of Onset  . Breast cancer Cousin   . Diabetes Mother   . Diabetes Father   . Hypertension Father   . Asthma Daughter   . Anemia Maternal Aunt   . Stroke Maternal Aunt   . Heart disease Maternal Grandmother   . Kidney disease Maternal Grandmother   . Hypertension Maternal Grandmother     Social History Social History   Tobacco Use  . Smoking status: Never Smoker  . Smokeless tobacco:  Never Used  Substance Use Topics  . Alcohol use: Yes    Alcohol/week: 0.0 oz    Comment: occasional  . Drug use: No    Review of Systems  Constitutional: No fever/chills Eyes: No visual changes. ENT: Left facial swelling and dental pain Cardiovascular: Denies chest pain. Respiratory: Denies shortness of breath. Gastrointestinal: No abdominal pain.  No nausea, no vomiting.  No diarrhea.  No constipation. Genitourinary: Negative for dysuria. Musculoskeletal: Negative for back pain. Skin: Negative for rash. Neurological: Negative for headaches, focal weakness or  numbness.   ____________________________________________   PHYSICAL EXAM:  VITAL SIGNS: ED Triage Vitals  Enc Vitals Group     BP 10/15/17 0230 (!) 145/92     Pulse Rate 10/15/17 0230 (!) 112     Resp 10/15/17 0230 19     Temp 10/15/17 0230 98 F (36.7 C)     Temp Source 10/15/17 0230 Oral     SpO2 10/15/17 0230 96 %     Weight 10/15/17 0232 145 lb (65.8 kg)     Height --      Head Circumference --      Peak Flow --      Pain Score 10/15/17 0230 9     Pain Loc --      Pain Edu? --      Excl. in [place]? --     Constitutional: Alert and oriented. Well appearing and in moderate distress. Eyes: Conjunctivae are normal. PERRL. EOMI. Head: Atraumatic.  Facial swelling with no palpable abscess Nose: No congestion/rhinnorhea. Mouth/Throat: Mucous membranes are moist.  Carotid tooth to left maxilla likely first or second molar. Cardiovascular: Normal rate, regular rhythm. Grossly normal heart sounds.  Good peripheral circulation. Respiratory: Normal respiratory effort.  No retractions. Lungs CTAB. Gastrointestinal: Soft and nontender. No distention.  Musculoskeletal: No lower extremity tenderness nor edema.   Neurologic:  Normal speech and language.  Skin:  Skin is warm, dry and intact. Psychiatric: Mood and affect are normal.   ____________________________________________   LABS (all labs ordered are listed, but only abnormal results are displayed)  Labs Reviewed  URINALYSIS, COMPLETE (UACMP) WITH MICROSCOPIC - Abnormal; Notable for the following components:      Result Value   Color, Urine YELLOW (*)    APPearance CLEAR (*)    Leukocytes, UA SMALL (*)    Bacteria, UA RARE (*)    Squamous Epithelial / LPF 0-5 (*)    All other components within normal limits   ____________________________________________  EKG  none ____________________________________________  RADIOLOGY  No results  found.  ____________________________________________   PROCEDURES  Procedure(s) performed: None  Procedures  Critical Care performed: No  ____________________________________________   INITIAL IMPRESSION / ASSESSMENT AND PLAN / ED COURSE  As part of my medical decision making, I reviewed the following data within the electronic MEDICAL RECORD NUMBER Notes from prior ED visits and [place] Controlled Substance Database   This is a 51 year old female who comes into the hospital today with some left facial swelling and some elevated blood pressure.  My differential diagnosis includes dental abscess  The patient does have poor dentition and appears to have a dental abscess.  I gave her a dose of tramadol as well as some viscous lidocaine and Keflex.  The patient's heart rate and blood pressure improved.  I will discharge her to follow back up with her dentist for removal of her right teeth.  I also check the patient's urine as she states she was urinating frequently.  The patient's urinalysis does not show any signs of infection.  The patient will be discharged home.      ____________________________________________   FINAL CLINICAL IMPRESSION(S) / ED DIAGNOSES  Final diagnoses:  Pain, dental  Dental caries     ED Discharge Orders        Ordered    cephALEXin (KEFLEX) 500 MG capsule  4 times daily     10/15/17 0330    traMADol (ULTRAM) 50 MG tablet  Every 6 hours PRN     10/15/17 0330    lidocaine (XYLOCAINE) 2 % solution  As needed     10/15/17 0330       Note:  This document was prepared using Dragon voice recognition software and may include unintentional dictation errors.    Loney Hering, MD 10/15/17 [phone]

## 2017-10-18 ENCOUNTER — Other Ambulatory Visit: Payer: Self-pay | Admitting: *Deleted

## 2017-10-18 NOTE — Patient Outreach (Signed)
[place] Care Management  10/18/2017  Makayla Mason [birth year] [id number]   Subjective: Telephone call to patient's home  / mobile number, no answer, left HIPAA compliant voicemail message, and requested call back.     Objective: Per KPN (Knowledge Performance Now, point of care tool) and chart review, patient has had no recent admissions.   Had ED visit on 10/15/17 for dental pain.  Patient has a history of Insulin Resistance, Arthralgia of temporomandibular joint, and Scoliosis.       Assessment: Received [facility] ED Census report referral on 10/16/17.   ED utilization follow up pending patient contact.      Plan: RNCM will call patient for 2nd telephone outreach attempt, ED utilization follow up, within 10 business days if no return call.      Yama Nielson H. Annia Friendly, BSN, [place] [facility] Telephonic CM Phone: [phone] Fax: [phone]

## 2017-10-19 ENCOUNTER — Ambulatory Visit: Payer: Self-pay | Admitting: *Deleted

## 2017-10-19 ENCOUNTER — Other Ambulatory Visit: Payer: Self-pay | Admitting: *Deleted

## 2017-10-19 ENCOUNTER — Encounter: Payer: Self-pay | Admitting: *Deleted

## 2017-10-19 NOTE — Patient Outreach (Addendum)
[place] [facility]) Care Management  10/19/2017  Makayla Mason [birth year] [id number]   Subjective: Telephone call to patient's home / mobile number, spoke with patient, and HIPAA verified.  Discussed [facility] Care Management [facility] ED visit  follow up, patient voiced understanding, and is in agreement to follow up.   Patient states she is doing well, getting ready to go to work, can only talk for a few minutes, has not missed anytime from work , was able to switch with someone when she was not feeling well, and has a follow up appointment with dentist on 10/23/17 for tooth removal. She declined to complete medication review due to having to leave to go to work soon and stated she has no issues with her medications.  States she is accessing the following [facility] benefits: outpatient pharmacy, dental plan with [employer],  and does not need family medical leave act (FMLA) at this time.  Patient voices understanding of medical diagnosis and treatment plan. Patient states he does not have any education material, transition of care, care coordination, disease management, disease monitoring, transportation, community resource, or pharmacy needs at this time.   States she is very appreciative of the follow up and is in agreement to receive [place] Management information.    Objective: Per KPN (Knowledge Performance Now, point of care tool) and chart review, patient has had no recent admissions.   Had ED visit on 10/15/17 for dental pain.  Patient has a history of Insulin Resistance, Arthralgia of temporomandibular joint, and Scoliosis.       Assessment: Received [facility] ED Census report referral on 10/16/17.    ED utilization follow up completed, no care management needs, and will proceed with case closure.     Plan: RNCM will send patient successful outreach letter, [facility] pamphlet, and magnet. RNCM will send case closure due to follow up completed / no care management needs request to [facility] at  [place] [facility].       Chantea Surace H. Annia Friendly, BSN, [place] Management [facility] Telephonic CM Phone: [phone] Fax: [phone]

## 2017-11-12 ENCOUNTER — Ambulatory Visit (INDEPENDENT_AMBULATORY_CARE_PROVIDER_SITE_OTHER): Payer: 59 | Admitting: Family Medicine

## 2017-11-12 ENCOUNTER — Encounter: Payer: Self-pay | Admitting: Family Medicine

## 2017-11-12 VITALS — BP 110/70 | HR 100 | Temp 99.0°F | Resp 16 | Ht 63.0 in | Wt 151.5 lb

## 2017-11-12 DIAGNOSIS — J302 Other seasonal allergic rhinitis: Secondary | ICD-10-CM | POA: Diagnosis not present

## 2017-11-12 DIAGNOSIS — J069 Acute upper respiratory infection, unspecified: Secondary | ICD-10-CM

## 2017-11-12 MED ORDER — PROMETHAZINE-DM 6.25-15 MG/5ML PO SYRP: 5.0000 mL | ORAL_SOLUTION | Freq: Four times a day (QID) | ORAL | 0 refills | Status: DC | PRN

## 2017-11-12 MED ORDER — AZELASTINE-FLUTICASONE 137-50 MCG/ACT NA SUSP: 2.0000 | Freq: Every day | NASAL | 5 refills | Status: DC

## 2017-11-12 MED ORDER — LEVOCETIRIZINE DIHYDROCHLORIDE 5 MG PO TABS: 5.0000 mg | ORAL_TABLET | Freq: Every evening | ORAL | 1 refills | Status: DC

## 2017-11-12 NOTE — Patient Instructions (Signed)
You may take Ibuprofen or tylenol as needed for pain and fevers/chills.  Upper Respiratory Infection, Adult Most upper respiratory infections (URIs) are caused by a virus. A URI affects the nose, throat, and upper air passages. The most common type of URI is often called "the common cold." Follow these instructions at home:  Take medicines only as told by your doctor.  Gargle warm saltwater or take cough drops to comfort your throat as told by your doctor.  Use a warm mist humidifier or inhale steam from a shower to increase air moisture. This may make it easier to breathe.  Drink enough fluid to keep your pee (urine) clear or pale yellow.  Eat soups and other clear broths.  Have a healthy diet.  Rest as needed.  Go back to work when your fever is gone or your doctor says it is okay. ? You may need to stay home longer to avoid giving your URI to others. ? You can also wear a face mask and wash your hands often to prevent spread of the virus.  Use your inhaler more if you have asthma.  Do not use any tobacco products, including cigarettes, chewing tobacco, or electronic cigarettes. If you need help quitting, ask your doctor. Contact a doctor if:  You are getting worse, not better.  Your symptoms are not helped by medicine.  You have chills.  You are getting more short of breath.  You have brown or red mucus.  You have yellow or brown discharge from your nose.  You have pain in your face, especially when you bend forward.  You have a fever.  You have puffy (swollen) neck glands.  You have pain while swallowing.  You have white areas in the back of your throat. Get help right away if:  You have very bad or constant: ? Headache. ? Ear pain. ? Pain in your forehead, behind your eyes, and over your cheekbones (sinus pain). ? Chest pain.  You have long-lasting (chronic) lung disease and any of the following: ? Wheezing. ? Long-lasting cough. ? Coughing up  blood. ? A change in your usual mucus.  You have a stiff neck.  You have changes in your: ? Vision. ? Hearing. ? Thinking. ? Mood. This information is not intended to replace advice given to you by your health care provider. Make sure you discuss any questions you have with your health care provider. Document Released: 04/10/2008 Document Revised: 06/25/2016 Document Reviewed: 01/28/2014 Elsevier Interactive Patient Education  2018 [employer].

## 2017-11-12 NOTE — Progress Notes (Signed)
Name: Makayla Mason   MRN: [id number]    DOB: 07/28/1966   Date:11/12/2017       Progress Note  Subjective  Chief Complaint  Chief Complaint  Patient presents with  . URI    cough, congested for 4 days    HPI  Patient presents for URI symptoms x4 days.  She reports cough, nasal congestion, subjective fevers and chills, slight sinus headache, also neck has been sore but not stiff.  She denies sinus pain, chest pain or shortness of breath, ear pain/pressure, NVD, abdominal pain. Using Flonase and Xyzal daily - needs refills.  She has also tried Alka-Seltzer cold and cough with only minimal relief.  Patient Active Problem List   Diagnosis Date Noted  . Insulin resistance 02/28/2017  . Low grade squamous intraepith lesion on cytologic smear cervix (lgsil) 02/28/2017  . GERD without esophagitis 03/15/2016  . Left ankle pain 03/15/2016  . Allergic rhinitis, seasonal 05/21/2015  . Scoliosis 05/21/2015  . 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 Smoking status: Never Smoker  . Smokeless tobacco: Never Used  Substance Use Topics  . Alcohol use: Yes    Alcohol/week: 0.0 oz    Comment: occasional     Current Outpatient Medications:  .  fluticasone (FLONASE) 50 MCG/ACT nasal spray, Place 2 sprays into both nostrils daily., Disp: 16 g, Rfl: 2 .  levocetirizine (XYZAL) 5 MG tablet, Take 1 tablet (5 mg total) by mouth every evening., Disp: 90 tablet, Rfl: 1 .  metFORMIN (GLUCOPHAGE-XR) 500 MG 24 hr tablet, Take 1 tablet (500 mg total) by mouth daily with breakfast., Disp: 90 tablet, Rfl: 1 .  omeprazole (PRILOSEC) 40 MG capsule, Take 1 capsule (40 mg total) by mouth daily., Disp: 90 capsule, Rfl: 1 .  lidocaine (XYLOCAINE) 2 % solution, Use as directed 20 mLs in the mouth or throat as needed for mouth pain. (Patient not taking: Reported on 11/12/2017), Disp: 100 mL, Rfl: 0 .  traMADol (ULTRAM) 50 MG tablet, Take 1 tablet (50 mg total) by mouth  every 6 (six) hours as needed. (Patient not taking: Reported on 11/12/2017), Disp: 12 tablet, Rfl: 0  Allergies  Allergen Reactions  . Codeine Nausea And Vomiting    ROS  Ten systems reviewed and is negative except as mentioned in HPI.  Objective  Vitals:   11/12/17 1512  BP: 110/70  Pulse: 100  Resp: 16  Temp: 99 F (37.2 C)  TempSrc: Oral  SpO2: 94%  Weight: 151 lb 8 oz (68.7 kg)  Height: 5\' 3"  (1.6 m)   Body mass index is 26.84 kg/m.  Nursing Note and Vital Signs reviewed.  Physical Exam  Constitutional: Patient appears well-developed and well-nourished. Obese No distress.  HEENT: head atraumatic, normocephalic, pupils equal and reactive to light, EOM's intact, TM's without erythema or bulging, no maxillary or frontal sinus pain on palpation, neck supple with full AROM, with mild submandibular lymphadenopathy worse on the RIGHT, oropharynx pink and moist without exudate.  Turbinates inflamed.  Cardiovascular: Normal rate, regular rhythm, S1/S2 present.  No murmur or rub heard. No BLE edema. Pulmonary/Chest: Effort normal and breath sounds clear. No respiratory distress or retractions. Psychiatric: Patient has a normal mood and affect. behavior is normal. Judgment and thought content normal.  Recent Results (from the past 2160 hour(s))  Urinalysis, Complete w Microscopic     Status: Abnormal   Collection Time: 10/15/17  3:03 AM  Result Value Ref Range  Color, Urine YELLOW (A) YELLOW   APPearance CLEAR (A) CLEAR   Specific Gravity, Urine 1.018 1.005 - 1.030   pH 6.0 5.0 - 8.0   Glucose, UA NEGATIVE NEGATIVE mg/dL   Hgb urine dipstick NEGATIVE NEGATIVE   Bilirubin Urine NEGATIVE NEGATIVE   Ketones, ur NEGATIVE NEGATIVE mg/dL   Protein, ur NEGATIVE NEGATIVE mg/dL   Nitrite NEGATIVE NEGATIVE   Leukocytes, UA SMALL (A) NEGATIVE   RBC / HPF 0-5 0 - 5 RBC/hpf   WBC, UA 6-30 0 - 5 WBC/hpf   Bacteria, UA RARE (A) NONE SEEN   Squamous Epithelial / LPF 0-5 (A) NONE SEEN    Mucus PRESENT      Assessment & Plan  1. Upper respiratory tract infection, unspecified type - Drink plenty of fluids, get plenty of rest, ibuprofen as needed for pain or fevers. - promethazine-dextromethorphan (PROMETHAZINE-DM) 6.25-15 MG/5ML syrup; Take 5 mLs by mouth 4 (four) times daily as needed for cough.  Dispense: 180 mL; Refill: 0 - Azelastine-Fluticasone 137-50 MCG/ACT SUSP; Place 2 sprays into the nose daily.  Dispense: 23 g; Refill: 5 - levocetirizine (XYZAL) 5 MG tablet; Take 1 tablet (5 mg total) by mouth every evening.  Dispense: 90 tablet; Refill: 1 - Work note for tomorrow provided to patient.  2. Seasonal allergic rhinitis, unspecified trigger - levocetirizine (XYZAL) 5 MG tablet; Take 1 tablet (5 mg total) by mouth every evening.  Dispense: 90 tablet; Refill: 1  -Red flags and when to present for emergency care or RTC including fever >101.27F, chest pain, shortness of breath, stiff neck, new/worsening/un-resolving symptoms, reviewed with patient at time of visit. Follow up and care instructions discussed and provided in AVS.

## 2017-12-03 ENCOUNTER — Ambulatory Visit (INDEPENDENT_AMBULATORY_CARE_PROVIDER_SITE_OTHER): Payer: 59 | Admitting: Family Medicine

## 2017-12-03 ENCOUNTER — Encounter: Payer: Self-pay | Admitting: Family Medicine

## 2017-12-03 VITALS — BP 128/82 | HR 90 | Temp 98.1°F | Resp 16 | Ht 63.0 in | Wt 153.9 lb

## 2017-12-03 DIAGNOSIS — E88819 Insulin resistance, unspecified: Secondary | ICD-10-CM

## 2017-12-03 DIAGNOSIS — R7303 Prediabetes: Secondary | ICD-10-CM | POA: Diagnosis not present

## 2017-12-03 DIAGNOSIS — J302 Other seasonal allergic rhinitis: Secondary | ICD-10-CM | POA: Diagnosis not present

## 2017-12-03 DIAGNOSIS — R87612 Low grade squamous intraepithelial lesion on cytologic smear of cervix (LGSIL): Secondary | ICD-10-CM | POA: Diagnosis not present

## 2017-12-03 DIAGNOSIS — E8881 Metabolic syndrome: Secondary | ICD-10-CM

## 2017-12-03 DIAGNOSIS — K219 Gastro-esophageal reflux disease without esophagitis: Secondary | ICD-10-CM | POA: Diagnosis not present

## 2017-12-03 MED ORDER — METFORMIN HCL 500 MG PO TABS: 500.0000 mg | ORAL_TABLET | Freq: Every day | ORAL | 1 refills | Status: DC

## 2017-12-03 MED ORDER — OMEPRAZOLE 40 MG PO CPDR
40.0000 mg | DELAYED_RELEASE_CAPSULE | Freq: Every day | ORAL | 1 refills | Status: DC
Start: 2017-12-03 — End: 2018-04-12

## 2017-12-03 NOTE — Progress Notes (Signed)
Name: Makayla Mason   MRN: [id number]    DOB: [birth year]   Date:12/03/2017       Progress Note  Subjective  Chief Complaint  Chief Complaint  Patient presents with  . Medication Refill    6 month F/U  . Insulin Resistance    Would like to have labs checked since they have improved greatly.  . Gastroesophageal Reflux    Stable with medication  . Allergic Rhinitis     Has been controlled with Dymista    HPI  Insulin Resistance: she came in to discuss options. She denies polyphagia, polyuria or polyphagia. Discussed life style modification also. HgbA1C 6.0 went down to 5.3% , she states Metformin ER is too big, but does not want to stop Metformin we will try immediate release medication, and possible diarrhea as a side effects  GERD: she takes Omeprazole daily, and symptoms are controlled, no heartburn or regurgitation, avoiding fried food and spicy food. Discussed long term use of PPI, she is now taking it prn, discussed switching to Ranitidine, but she is afraid of stopping it.   Seasonal allergic rhinitis: she is doing well on Dymista, seems to control her allergy symptoms.   LGISL: she saw Dr. Amalia Hailey and was advised to repeat pap smear in 1 years, due in March  Vaginal dryness: we will start her on topical estrogen during her next visit, after we collect pap smear.   Patient Active Problem List   Diagnosis Date Noted  . Insulin resistance 02/28/2017  . Low grade squamous intraepith lesion on cytologic smear cervix (lgsil) 02/28/2017  . GERD without esophagitis 03/15/2016  . Left ankle pain 03/15/2016  . Allergic rhinitis, seasonal 05/21/2015  . Scoliosis 05/21/2015  . 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 No pertinent surgical history.  Family History  Problem Relation Age of Onset  . Breast cancer Cousin   . Diabetes Mother   . Diabetes Father   . Hypertension Father   . Asthma Daughter   . Anemia Maternal Aunt   . Stroke Maternal  Aunt   . Heart disease Maternal Grandmother   . Kidney disease Maternal Grandmother   . Hypertension Maternal Grandmother     Social History   Socioeconomic History  . Marital status: Divorced    Spouse name: Not on file  . Number of children: Not on file  . Years of education: Not on file  . Highest education level: Not on file  Social Needs  . Financial resource strain: Not on file  . Food insecurity - worry: Not on file  . Food insecurity - inability: Not on file  . Transportation needs - medical: Not on file  . Transportation needs - non-medical: Not on file  Occupational History  . Not on file  Tobacco Use  . Smoking status: Never Smoker  . Smokeless tobacco: Never Used  Substance and Sexual Activity  . Alcohol use: Yes    Alcohol/week: 0.0 oz    Comment: occasional  . Drug use: No  . Sexual activity: Yes    Birth control/protection: Post-menopausal  Other Topics Concern  . Not on file  Social History Narrative  . Not on file     Current Outpatient Medications:  .  Azelastine-Fluticasone 137-50 MCG/ACT SUSP, Place 2 sprays into the nose daily., Disp: 23 g, Rfl: 5 .  levocetirizine (XYZAL) 5 MG tablet, Take 1 tablet (5 mg total) by mouth every evening., Disp: 90 tablet, Rfl: 1 .  omeprazole (PRILOSEC) 40 MG capsule, Take 1 capsule (40 mg total) by mouth daily., Disp: 90 capsule, Rfl: 1  Allergies  Allergen Reactions  . Codeine Nausea And Vomiting     ROS  Constitutional: Negative for fever or weight change.  Respiratory: Negative for cough and shortness of breath.   Cardiovascular: Negative for chest pain or palpitations.  Gastrointestinal: Negative for abdominal pain, no bowel changes.  Musculoskeletal: Negative for gait problem or joint swelling.  Skin: Negative for rash.  Neurological: Negative for dizziness or headache.  No other specific complaints in a complete review of systems (except as listed in HPI above).  Objective  Vitals:   12/03/17  0945  BP: 128/82  Pulse: 90  Resp: 16  Temp: 98.1 F (36.7 C)  TempSrc: Oral  SpO2: 98%  Weight: 153 lb 14.4 oz (69.8 kg)  Height: 5\' 3"  (1.6 m)    Body mass index is 27.26 kg/m.  Physical Exam  Constitutional: Patient appears well-developed and well-nourished. Overweight No distress.  HEENT: head atraumatic, normocephalic, pupils equal and reactive to light, neck supple, throat within normal limits Cardiovascular: Normal rate, regular rhythm and normal heart sounds.  No murmur heard. No BLE edema. Pulmonary/Chest: Effort normal and breath sounds normal. No respiratory distress. Abdominal: Soft.  There is no tenderness. Psychiatric: Patient has a normal mood and affect. behavior is normal. Judgment and thought content normal.  Recent Results (from the past 2160 hour(s))  Urinalysis, Complete w Microscopic     Status: Abnormal   Collection Time: 10/15/17  3:03 AM  Result Value Ref Range   Color, Urine YELLOW (A) YELLOW   APPearance CLEAR (A) CLEAR   Specific Gravity, Urine 1.018 1.005 - 1.030   pH 6.0 5.0 - 8.0   Glucose, UA NEGATIVE NEGATIVE mg/dL   Hgb urine dipstick NEGATIVE NEGATIVE   Bilirubin Urine NEGATIVE NEGATIVE   Ketones, ur NEGATIVE NEGATIVE mg/dL   Protein, ur NEGATIVE NEGATIVE mg/dL   Nitrite NEGATIVE NEGATIVE   Leukocytes, UA SMALL (A) NEGATIVE   RBC / HPF 0-5 0 - 5 RBC/hpf   WBC, UA 6-30 0 - 5 WBC/hpf   Bacteria, UA RARE (A) NONE SEEN   Squamous Epithelial / LPF 0-5 (A) NONE SEEN   Mucus PRESENT       PHQ2/9: Depression screen [facility] 2/9 12/03/2017 04/04/2017 01/25/2017 03/15/2016 05/21/2015  Decreased Interest 0 0 0 0 0  Down, Depressed, Hopeless 0 0 0 0 0  PHQ - 2 Score 0 0 0 0 0     Fall Risk: Fall Risk  12/03/2017 04/04/2017 01/25/2017 03/15/2016 05/21/2015  Falls in the past year? Yes No No No No  Number falls in past yr: 1 - - - -  Injury with Fall? Yes - - - -  Comment Right Leg - - - -    Functional Status Survey: Is the patient deaf or have  difficulty hearing?: No Does the patient have difficulty seeing, even when wearing glasses/contacts?: No Does the patient have difficulty concentrating, remembering, or making decisions?: No Does the patient have difficulty walking or climbing stairs?: No Does the patient have difficulty dressing or bathing?: No Does the patient have difficulty doing errands alone such as visiting a doctor's office or shopping?: No   Assessment & Plan  1. GERD without esophagitis  - omeprazole (PRILOSEC) 40 MG capsule; Take 1 capsule (40 mg total) by mouth daily.  Dispense: 90 capsule; Refill: 1  2. Seasonal allergic rhinitis, unspecified trigger   3.  Insulin resistance  - metFORMIN (GLUCOPHAGE) 500 MG tablet; Take 1 tablet (500 mg total) by mouth daily with breakfast.  Dispense: 90 tablet; Refill: 1  4. Prediabetes  - metFORMIN (GLUCOPHAGE) 500 MG tablet; Take 1 tablet (500 mg total) by mouth daily with breakfast.  Dispense: 90 tablet; Refill: 1  5. Low grade squamous intraepith lesion on cytologic smear cervix (lgsil)  Return for repeat pap smear

## 2017-12-08 ENCOUNTER — Emergency Department: Admission: EM | Admit: 2017-12-08 | Discharge: 2017-12-08 | Discharge status: Against Medical Advice | Payer: 59

## 2017-12-24 ENCOUNTER — Telehealth: Payer: Self-pay | Admitting: Family Medicine

## 2017-12-24 NOTE — Telephone Encounter (Signed)
Copied from [place]. Topic: Quick Communication - See Telephone Encounter >> Dec 24, 2017 12:41 PM Vernona Rieger wrote: CRM for notification. See Telephone encounter for:   12/24/17.   Pt said that Dr Ancil Boozer was going to call something in for her in place of the metformin. She said that the pharmacy does not have anything  Mark, [place]

## 2017-12-25 NOTE — Telephone Encounter (Signed)
I changed from sustained release to immediate release. , she should be able to tolerate it

## 2017-12-25 NOTE — Telephone Encounter (Signed)
Pt said that Dr Ancil Boozer was going to call something in for her in place of the metformin. She said that the pharmacy does not have anything  Tom Bean, [place]

## 2017-12-26 ENCOUNTER — Encounter: Payer: Self-pay | Admitting: Family Medicine

## 2017-12-26 ENCOUNTER — Ambulatory Visit (INDEPENDENT_AMBULATORY_CARE_PROVIDER_SITE_OTHER): Payer: 59 | Admitting: Family Medicine

## 2017-12-26 VITALS — BP 130/70 | HR 92 | Temp 97.8°F | Resp 14 | Ht 63.0 in | Wt 153.6 lb

## 2017-12-26 DIAGNOSIS — J302 Other seasonal allergic rhinitis: Secondary | ICD-10-CM

## 2017-12-26 DIAGNOSIS — J40 Bronchitis, not specified as acute or chronic: Secondary | ICD-10-CM | POA: Diagnosis not present

## 2017-12-26 DIAGNOSIS — J3089 Other allergic rhinitis: Secondary | ICD-10-CM | POA: Diagnosis not present

## 2017-12-26 MED ORDER — BENZONATATE 100 MG PO CAPS: 100.0000 mg | ORAL_CAPSULE | Freq: Three times a day (TID) | ORAL | 0 refills | Status: DC | PRN

## 2017-12-26 MED ORDER — AZELASTINE-FLUTICASONE 137-50 MCG/ACT NA SUSP: 2.0000 | Freq: Every day | NASAL | 5 refills | Status: DC

## 2017-12-26 MED ORDER — MUCINEX DM MAXIMUM STRENGTH 60-1200 MG PO TB12: 1.0000 | ORAL_TABLET | Freq: Two times a day (BID) | ORAL | 0 refills | Status: DC

## 2017-12-26 MED ORDER — AZITHROMYCIN 250 MG PO TABS: ORAL_TABLET | ORAL | 0 refills | Status: DC

## 2017-12-26 NOTE — Progress Notes (Signed)
Name: Makayla Mason   MRN: [id number]    DOB: 1966/10/28   Date:12/26/2017       Progress Note  Subjective  Chief Complaint  Chief Complaint  Patient presents with  . Cough    HPI  Bronchitis: she states she has been sick for about 2 weeks, initially uri symptoms, but now has a wet cough, no wheezing or SOB. She is tired of coughing. No nausea, vomiting or change in appetite.   Patient Active Problem List   Diagnosis Date Noted  . Insulin resistance 02/28/2017  . Low grade squamous intraepith lesion on cytologic smear cervix (lgsil) 02/28/2017  . GERD without esophagitis 03/15/2016  . Left ankle pain 03/15/2016  . Allergic rhinitis, seasonal 05/21/2015  . Scoliosis 05/21/2015  . 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 Smoking status: Never Smoker  . Smokeless tobacco: Never Used  Substance Use Topics  . Alcohol use: Yes    Alcohol/week: 0.0 oz    Comment: occasional     Current Outpatient Medications:  .  Azelastine-Fluticasone 137-50 MCG/ACT SUSP, Place 2 sprays into the nose daily., Disp: 23 g, Rfl: 5 .  levocetirizine (XYZAL) 5 MG tablet, Take 1 tablet (5 mg total) by mouth every evening., Disp: 90 tablet, Rfl: 1 .  omeprazole (PRILOSEC) 40 MG capsule, Take 1 capsule (40 mg total) by mouth daily., Disp: 90 capsule, Rfl: 1 .  azithromycin (ZITHROMAX) 250 MG tablet, Take as directed, Disp: 6 tablet, Rfl: 0 .  benzonatate (TESSALON) 100 MG capsule, Take 1-2 capsules (100-200 mg total) by mouth 3 (three) times daily as needed., Disp: 40 capsule, Rfl: 0  Allergies  Allergen Reactions  . Codeine Nausea And Vomiting    ROS  Ten systems reviewed and is negative except as mentioned in HPI   Objective  Vitals:   12/26/17 1351  BP: 130/70  Pulse: 92  Resp: 14  Temp: 97.8 F (36.6 C)  TempSrc: Oral  SpO2: 98%  Weight: 153 lb 9.6 oz (69.7 kg)  Height: 5\' 3"  (1.6 m)    Body mass index is 27.21 kg/m.    Physical  Exam  Constitutional: Patient appears well-developed and well-nourished. No distress.  HEENT: head atraumatic, normocephalic, pupils equal and reactive to light, ears normal TM bilaterally,  neck supple, throat within normal limits, boggy turbinates Cardiovascular: Normal rate, regular rhythm and normal heart sounds.  No murmur heard. No BLE edema. Pulmonary/Chest: Effort normal and breath sounds normal. No respiratory distress. Abdominal: Soft.  There is no tenderness. Psychiatric: Patient has a normal mood and affect. behavior is normal. Judgment and thought content normal.  Recent Results (from the past 2160 hour(s))  Urinalysis, Complete w Microscopic     Status: Abnormal   Collection Time: 10/15/17  3:03 AM  Result Value Ref Range   Color, Urine YELLOW (A) YELLOW   APPearance CLEAR (A) CLEAR   Specific Gravity, Urine 1.018 1.005 - 1.030   pH 6.0 5.0 - 8.0   Glucose, UA NEGATIVE NEGATIVE mg/dL   Hgb urine dipstick NEGATIVE NEGATIVE   Bilirubin Urine NEGATIVE NEGATIVE   Ketones, ur NEGATIVE NEGATIVE mg/dL   Protein, ur NEGATIVE NEGATIVE mg/dL   Nitrite NEGATIVE NEGATIVE   Leukocytes, UA SMALL (A) NEGATIVE   RBC / HPF 0-5 0 - 5 RBC/hpf   WBC, UA 6-30 0 - 5 WBC/hpf   Bacteria, UA RARE (A) NONE SEEN   Squamous Epithelial / LPF 0-5 (A) NONE  SEEN   Mucus PRESENT      Assessment & Plan  1. Perennial allergic rhinitis with seasonal variation  - Azelastine-Fluticasone 137-50 MCG/ACT SUSP; Place 2 sprays into the nose daily.  Dispense: 23 g; Refill: 5  2. Bronchitis  - azithromycin (ZITHROMAX) 250 MG tablet; Take as directed  Dispense: 6 tablet; Refill: 0 - benzonatate (TESSALON) 100 MG capsule; Take 1-2 capsules (100-200 mg total) by mouth 3 (three) times daily as needed.  Dispense: 40 capsule; Refill: 0

## 2018-01-28 ENCOUNTER — Other Ambulatory Visit: Payer: Self-pay | Admitting: Family Medicine

## 2018-01-28 ENCOUNTER — Encounter: Payer: 59 | Admitting: Family Medicine

## 2018-01-28 DIAGNOSIS — Z1231 Encounter for screening mammogram for malignant neoplasm of breast: Secondary | ICD-10-CM

## 2018-01-29 ENCOUNTER — Encounter: Payer: Self-pay | Admitting: Family Medicine

## 2018-01-29 ENCOUNTER — Ambulatory Visit (INDEPENDENT_AMBULATORY_CARE_PROVIDER_SITE_OTHER): Payer: 59 | Admitting: Family Medicine

## 2018-01-29 VITALS — BP 120/70 | HR 99 | Temp 98.2°F | Resp 18 | Ht 63.0 in | Wt 156.2 lb

## 2018-01-29 DIAGNOSIS — Z0000 Encounter for general adult medical examination without abnormal findings: Secondary | ICD-10-CM

## 2018-01-29 DIAGNOSIS — R937 Abnormal findings on diagnostic imaging of other parts of musculoskeletal system: Secondary | ICD-10-CM | POA: Diagnosis not present

## 2018-01-29 DIAGNOSIS — Z1211 Encounter for screening for malignant neoplasm of colon: Secondary | ICD-10-CM | POA: Diagnosis not present

## 2018-01-29 DIAGNOSIS — M25551 Pain in right hip: Secondary | ICD-10-CM | POA: Diagnosis not present

## 2018-01-29 DIAGNOSIS — J302 Other seasonal allergic rhinitis: Secondary | ICD-10-CM | POA: Diagnosis not present

## 2018-01-29 DIAGNOSIS — E88819 Insulin resistance, unspecified: Secondary | ICD-10-CM

## 2018-01-29 DIAGNOSIS — E8881 Metabolic syndrome: Secondary | ICD-10-CM

## 2018-01-29 DIAGNOSIS — E663 Overweight: Secondary | ICD-10-CM

## 2018-01-29 DIAGNOSIS — Z1212 Encounter for screening for malignant neoplasm of rectum: Secondary | ICD-10-CM | POA: Diagnosis not present

## 2018-01-29 DIAGNOSIS — Z13820 Encounter for screening for osteoporosis: Secondary | ICD-10-CM

## 2018-01-29 DIAGNOSIS — Z13220 Encounter for screening for lipoid disorders: Secondary | ICD-10-CM | POA: Diagnosis not present

## 2018-01-29 DIAGNOSIS — R0683 Snoring: Secondary | ICD-10-CM

## 2018-01-29 LAB — COMPLETE METABOLIC PANEL WITH GFR
AG Ratio: 1.3 (calc) (ref 1.0–2.5)
ALT: 14 U/L (ref 6–29)
AST: 15 U/L (ref 10–35)
Albumin: 4.1 g/dL (ref 3.6–5.1)
Alkaline phosphatase (APISO): 97 U/L (ref 33–130)
BUN: 15 mg/dL (ref 7–25)
CALCIUM: 9.4 mg/dL (ref 8.6–10.4)
CO2: 27 mmol/L (ref 20–32)
CREATININE: 0.72 mg/dL (ref 0.50–1.05)
Chloride: 104 mmol/L (ref 98–110)
GFR, EST NON AFRICAN AMERICAN: 95 mL/min/{1.73_m2} (ref >=60)
GFR, Est African American: 110 mL/min/{1.73_m2} (ref >=60)
GLUCOSE: 75 mg/dL (ref 65–99)
Globulin: 3.2 g/dL (calc) (ref 1.9–3.7)
POTASSIUM: 4.2 mmol/L (ref 3.5–5.3)
Sodium: 142 mmol/L (ref 135–146)
Total Bilirubin: 0.3 mg/dL (ref 0.2–1.2)
Total Protein: 7.3 g/dL (ref 6.1–8.1)

## 2018-01-29 LAB — LIPID PANEL
CHOL/HDL RATIO: 3.2 (calc) (ref <5.0)
Cholesterol: 164 mg/dL (ref <200)
HDL: 51 mg/dL (ref >50)
LDL CHOLESTEROL (CALC): 88 mg/dL
NON-HDL CHOLESTEROL (CALC): 113 mg/dL (ref <130)
TRIGLYCERIDES: 145 mg/dL (ref <150)

## 2018-01-29 MED ORDER — FEXOFENADINE HCL 180 MG PO TABS: 180.0000 mg | ORAL_TABLET | Freq: Every day | ORAL | 1 refills | Status: DC

## 2018-01-29 MED ORDER — TRIAMCINOLONE ACETONIDE 55 MCG/ACT NA AERO: 2.0000 | INHALATION_SPRAY | Freq: Every day | NASAL | 3 refills | Status: DC
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
## 2018-01-29 NOTE — Patient Instructions (Addendum)
Kegel Exercises Kegel exercises help strengthen the muscles that support the rectum, vagina, small intestine, bladder, and uterus. Doing Kegel exercises can help:  Improve bladder and bowel control.  Improve sexual response.  Reduce problems and discomfort during pregnancy.  Kegel exercises involve squeezing your pelvic floor muscles, which are the same muscles you squeeze when you try to stop the flow of urine. The exercises can be done while sitting, standing, or lying down, but it is best to vary your position. Phase 1 exercises 1. Squeeze your pelvic floor muscles tight. You should feel a tight lift in your rectal area. If you are a female, you should also feel a tightness in your vaginal area. Keep your stomach, buttocks, and legs relaxed. 2. Hold the muscles tight for up to 10 seconds. 3. Relax your muscles. Repeat this exercise 50 times a day or as many times as told by your health care provider. Continue to do this exercise for at least 4-6 weeks or for as long as told by your health care provider. This information is not intended to replace advice given to you by your health care provider. Make sure you discuss any questions you have with your health care provider. Document Released: 10/09/2012 Document Revised: 06/17/2016 Document Reviewed: 09/12/2015 Elsevier Interactive Patient Education  2018 [place] Years, Female Preventive care refers to lifestyle choices and visits with your health care provider that can promote health and wellness. What does preventive care include?  A yearly physical exam. This is also called an annual well check.  Dental exams once or twice a year.  Routine eye exams. Ask your health care provider how often you should have your eyes checked.  Personal lifestyle choices, including: ? Daily care of your teeth and gums. ? Regular physical activity. ? Eating a healthy diet. ? Avoiding tobacco and drug use. ? Limiting  alcohol use. ? Practicing safe sex. ? Taking low-dose aspirin daily starting at age 56. ? Taking vitamin and mineral supplements as recommended by your health care provider. What happens during an annual well check? The services and screenings done by your health care provider during your annual well check will depend on your age, overall health, lifestyle risk factors, and family history of disease. Counseling Your health care provider may ask you questions about your:  Alcohol use.  Tobacco use.  Drug use.  Emotional well-being.  Home and relationship well-being.  Sexual activity.  Eating habits.  Work and work Statistician.  Method of birth control.  Menstrual cycle.  Pregnancy history.  Screening You may have the following tests or measurements:  Height, weight, and BMI.  Blood pressure.  Lipid and cholesterol levels. These may be checked every 5 years, or more frequently if you are over 62 years old.  Skin check.  Lung cancer screening. You may have this screening every year starting at age 37 if you have a 30-pack-year history of smoking and currently smoke or have quit within the past 15 years.  Fecal occult blood test (FOBT) of the stool. You may have this test every year starting at age 76.  Flexible sigmoidoscopy or colonoscopy. You may have a sigmoidoscopy every 5 years or a colonoscopy every 10 years starting at age 34.  Hepatitis C blood test.  Hepatitis B blood test.  Sexually transmitted disease (STD) testing.  Diabetes screening. This is done by checking your blood sugar (glucose) after you have not eaten for a while (fasting). You may have this  done every 1-3 years.  Mammogram. This may be done every 1-2 years. Talk to your health care provider about when you should start having regular mammograms. This may depend on whether you have a family history of breast cancer.  BRCA-related cancer screening. This may be done if you have a family  history of breast, ovarian, tubal, or peritoneal cancers.  Pelvic exam and Pap test. This may be done every 3 years starting at age 2. Starting at age 75, this may be done every 5 years if you have a Pap test in combination with an HPV test.  Bone density scan. This is done to screen for osteoporosis. You may have this scan if you are at high risk for osteoporosis.  Discuss your test results, treatment options, and if necessary, the need for more tests with your health care provider. Vaccines Your health care provider may recommend certain vaccines, such as:  Influenza vaccine. This is recommended every year.  Tetanus, diphtheria, and acellular pertussis (Tdap, Td) vaccine. You may need a Td booster every 10 years.  Varicella vaccine. You may need this if you have not been vaccinated.  Zoster vaccine. You may need this after age 30.  Measles, mumps, and rubella (MMR) vaccine. You may need at least one dose of MMR if you were born in 1957 or later. You may also need a second dose.  Pneumococcal 13-valent conjugate (PCV13) vaccine. You may need this if you have certain conditions and were not previously vaccinated.  Pneumococcal polysaccharide (PPSV23) vaccine. You may need one or two doses if you smoke cigarettes or if you have certain conditions.  Meningococcal vaccine. You may need this if you have certain conditions.  Hepatitis A vaccine. You may need this if you have certain conditions or if you travel or work in places where you may be exposed to hepatitis A.  Hepatitis B vaccine. You may need this if you have certain conditions or if you travel or work in places where you may be exposed to hepatitis B.  Haemophilus influenzae type b (Hib) vaccine. You may need this if you have certain conditions.  Talk to your health care provider about which screenings and vaccines you need and how often you need them. This information is not intended to replace advice given to you by your  health care provider. Make sure you discuss any questions you have with your health care provider. Document Released: 11/19/2015 Document Revised: 07/12/2016 Document Reviewed: 08/24/2015 Elsevier Interactive Patient Education  [employer].

## 2018-01-30 ENCOUNTER — Telehealth: Payer: Self-pay | Admitting: Family Medicine

## 2018-01-30 DIAGNOSIS — J302 Other seasonal allergic rhinitis: Secondary | ICD-10-CM

## 2018-01-30 NOTE — Telephone Encounter (Signed)
Copied from [place] [id number]. Topic: Referral - Request >> Jan 30, 2018  4:37 PM Cleaster Corin, NT wrote: Reason for CRM: Pt. Calling to see if she can be referred to an ENT [facility]. Pt. Would for for nurse or Dr. To give her a call when referral is sent

## 2018-01-31 NOTE — Telephone Encounter (Signed)
Please decide on how you want to proceed on this and advise.

## 2018-01-31 NOTE — Telephone Encounter (Signed)
For allergies?  Okay to place referral for that since we talked about on her last visit

## 2018-02-01 NOTE — Telephone Encounter (Signed)
Referral has been placed and sent to [facility] as requested.

## 2018-02-04 ENCOUNTER — Telehealth: Payer: Self-pay | Admitting: Gastroenterology

## 2018-02-04 ENCOUNTER — Other Ambulatory Visit: Payer: Self-pay | Admitting: Family Medicine

## 2018-02-04 DIAGNOSIS — M1611 Unilateral primary osteoarthritis, right hip: Secondary | ICD-10-CM | POA: Diagnosis not present

## 2018-02-04 DIAGNOSIS — M1711 Unilateral primary osteoarthritis, right knee: Secondary | ICD-10-CM | POA: Diagnosis not present

## 2018-02-04 NOTE — Telephone Encounter (Signed)
Makayla Mason LVM returning your call for colonoscopy.

## 2018-02-05 ENCOUNTER — Telehealth: Payer: Self-pay | Admitting: Family Medicine

## 2018-02-05 ENCOUNTER — Other Ambulatory Visit: Payer: Self-pay

## 2018-02-05 ENCOUNTER — Telehealth: Payer: Self-pay | Admitting: Gastroenterology

## 2018-02-05 DIAGNOSIS — Z1211 Encounter for screening for malignant neoplasm of colon: Secondary | ICD-10-CM

## 2018-02-05 NOTE — Telephone Encounter (Signed)
Patient contacted office to reschedule her colonoscopy from 02/21/18 to 02/15/18.  Informed pt that the provider will change from Dr. Marius Ditch to Dr. Vicente Males.  Notified Almyra Free in Endo of date change and she will notify [employer].  Thanks [employer]

## 2018-02-05 NOTE — Telephone Encounter (Signed)
Pt left vm to inform Sharyn Lull she can have the procedure April 12th

## 2018-02-05 NOTE — Telephone Encounter (Signed)
Gastroenterology Pre-Procedure Review  Request Date: April 18th Requesting Physician: Dr. Marius Ditch  PATIENT REVIEW QUESTIONS: The patient responded to the following health history questions as indicated:    1. Are you having any GI issues? no 2. Do you have a personal history of Polyps? no 3. Do you have a family history of Colon Cancer or Polyps? yes (mothers sister had nasal polyps) 4. Diabetes Mellitus? no 5. Joint replacements in the past 12 months?no 6. Major health problems in the past 3 months?no 7. Any artificial heart valves, MVP, or defibrillator?no    MEDICATIONS & ALLERGIES:    Patient reports the following regarding taking any anticoagulation/antiplatelet therapy:   Plavix, Coumadin, Eliquis, Xarelto, Lovenox, Pradaxa, Brilinta, or Effient? no Aspirin? yes (81 mg daily)  Patient confirms/reports the following medications:  Current Outpatient Medications  Medication Sig Dispense Refill  . fexofenadine (ALLEGRA ALLERGY) 180 MG tablet Take 1 tablet (180 mg total) by mouth daily. 90 tablet 1  . omeprazole (PRILOSEC) 40 MG capsule Take 1 capsule (40 mg total) by mouth daily. 90 capsule 1  . triamcinolone (NASACORT ALLERGY 24HR) 55 MCG/ACT AERO nasal inhaler Place 2 sprays into the nose daily. 1 Inhaler 3   No current facility-administered medications for this visit.     Patient confirms/reports the following allergies:  Allergies  Allergen Reactions  . Codeine Nausea And Vomiting    No orders of the defined types were placed in this encounter.   AUTHORIZATION INFORMATION Primary Insurance: 1D#: Group #:  Secondary Insurance: 1D#: Group #:  SCHEDULE INFORMATION: Date:02/21/18  Time: Location:[facility]

## 2018-02-05 NOTE — Telephone Encounter (Signed)
Referral has been changed to:  Su Raynelle Bring, MD, PA [phone] [place], [place] [address]   Main: [phone]  Fax: [phone]

## 2018-02-05 NOTE — Telephone Encounter (Signed)
Copied from [place] [id number]. Topic: Quick Communication - See Telephone Encounter >> Feb 05, 2018  9:16 AM Boyd Kerbs wrote: CRM for notification.   Pt. Missed 2 appt. With [place] and they will not see her.  She is asking for a referral to different doctor.  She missed one for work and one for death.  She did call office and canceled appt.   See Telephone encounter for: 02/05/18.

## 2018-02-07 ENCOUNTER — Encounter: Payer: 59 | Admitting: Obstetrics and Gynecology

## 2018-02-07 NOTE — Telephone Encounter (Signed)
Pt has already been advised to call [facility] and speak to the office manager and explain why she had to cancel her appts

## 2018-02-07 NOTE — Telephone Encounter (Signed)
Patient called and said she will not go to [facility] for an appt to ENT. She would like to talk to Dr. Ancil Boozer or her CMA about this and see if they can ask Dr. Tami Ribas to take her in.

## 2018-02-08 ENCOUNTER — Encounter: Payer: 59 | Admitting: Obstetrics and Gynecology

## 2018-02-12 ENCOUNTER — Other Ambulatory Visit: Payer: Self-pay

## 2018-02-12 ENCOUNTER — Encounter: Payer: 59 | Admitting: Obstetrics and Gynecology

## 2018-02-12 MED ORDER — NA SULFATE-K SULFATE-MG SULF 17.5-3.13-1.6 GM/177ML PO SOLN: 1.0000 | Freq: Once | ORAL | 0 refills | Status: AC

## 2018-02-13 ENCOUNTER — Encounter: Payer: Self-pay | Admitting: Obstetrics and Gynecology

## 2018-02-13 ENCOUNTER — Ambulatory Visit (INDEPENDENT_AMBULATORY_CARE_PROVIDER_SITE_OTHER): Payer: 59 | Admitting: Obstetrics and Gynecology

## 2018-02-13 VITALS — BP 129/89 | HR 81 | Ht 63.0 in | Wt 153.7 lb

## 2018-02-13 DIAGNOSIS — R238 Other skin changes: Secondary | ICD-10-CM

## 2018-02-13 DIAGNOSIS — Z01419 Encounter for gynecological examination (general) (routine) without abnormal findings: Secondary | ICD-10-CM

## 2018-02-13 DIAGNOSIS — N952 Postmenopausal atrophic vaginitis: Secondary | ICD-10-CM

## 2018-02-13 DIAGNOSIS — E663 Overweight: Secondary | ICD-10-CM

## 2018-02-13 DIAGNOSIS — N951 Menopausal and female climacteric states: Secondary | ICD-10-CM | POA: Diagnosis not present

## 2018-02-13 DIAGNOSIS — J329 Chronic sinusitis, unspecified: Secondary | ICD-10-CM

## 2018-02-13 DIAGNOSIS — T4995XA Adverse effect of unspecified topical agent, initial encounter: Secondary | ICD-10-CM | POA: Diagnosis not present

## 2018-02-13 MED ORDER — HYDROCORTISONE 0.5 % EX CREA: 1.0000 "application " | TOPICAL_CREAM | Freq: Two times a day (BID) | CUTANEOUS | 0 refills | Status: DC

## 2018-02-13 MED ORDER — CONJ ESTROG-MEDROXYPROGEST ACE 0.625-2.5 MG PO TABS: 1.0000 | ORAL_TABLET | Freq: Every day | ORAL | 11 refills | Status: DC

## 2018-02-13 NOTE — Progress Notes (Signed)
Pt noticed burning after using bath and body works shower gels. Pt has had a history of abnormal pap.

## 2018-02-13 NOTE — Patient Instructions (Signed)
Health Maintenance for Postmenopausal Women Menopause is a normal process in which your reproductive ability comes to an end. This process happens gradually over a span of months to years, usually between the ages of 22 and 9. Menopause is complete when you have missed 12 consecutive menstrual periods. It is important to talk with your health care provider about some of the most common conditions that affect postmenopausal women, such as heart disease, cancer, and bone loss (osteoporosis). Adopting a healthy lifestyle and getting preventive care can help to promote your health and wellness. Those actions can also lower your chances of developing some of these common conditions. What should I know about menopause? During menopause, you may experience a number of symptoms, such as:  Moderate-to-severe hot flashes.  Night sweats.  Decrease in sex drive.  Mood swings.  Headaches.  Tiredness.  Irritability.  Memory problems.  Insomnia.  Choosing to treat or not to treat menopausal changes is an individual decision that you make with your health care provider. What should I know about hormone replacement therapy and supplements? Hormone therapy products are effective for treating symptoms that are associated with menopause, such as hot flashes and night sweats. Hormone replacement carries certain risks, especially as you become older. If you are thinking about using estrogen or estrogen with progestin treatments, discuss the benefits and risks with your health care provider. What should I know about heart disease and stroke? Heart disease, heart attack, and stroke become more likely as you age. This may be due, in part, to the hormonal changes that your body experiences during menopause. These can affect how your body processes dietary fats, triglycerides, and cholesterol. Heart attack and stroke are both medical emergencies. There are many things that you can do to help prevent heart disease  and stroke:  Have your blood pressure checked at least every 1-2 years. High blood pressure causes heart disease and increases the risk of stroke.  If you are 53-22 years old, ask your health care provider if you should take aspirin to prevent a heart attack or a stroke.  Do not use any tobacco products, including cigarettes, chewing tobacco, or electronic cigarettes. If you need help quitting, ask your health care provider.  It is important to eat a healthy diet and maintain a healthy weight. ? Be sure to include plenty of vegetables, fruits, low-fat dairy products, and lean protein. ? Avoid eating foods that are high in solid fats, added sugars, or salt (sodium).  Get regular exercise. This is one of the most important things that you can do for your health. ? Try to exercise for at least 150 minutes each week. The type of exercise that you do should increase your heart rate and make you sweat. This is known as moderate-intensity exercise. ? Try to do strengthening exercises at least twice each week. Do these in addition to the moderate-intensity exercise.  Know your numbers.Ask your health care provider to check your cholesterol and your blood glucose. Continue to have your blood tested as directed by your health care provider.  What should I know about cancer screening? There are several types of cancer. Take the following steps to reduce your risk and to catch any cancer development as early as possible. Breast Cancer  Practice breast self-awareness. ? This means understanding how your breasts normally appear and feel. ? It also means doing regular breast self-exams. Let your health care provider know about any changes, no matter how small.  If you are 40  or older, have a clinician do a breast exam (clinical breast exam or CBE) every year. Depending on your age, family history, and medical history, it may be recommended that you also have a yearly breast X-ray (mammogram).  If you  have a family history of breast cancer, talk with your health care provider about genetic screening.  If you are at high risk for breast cancer, talk with your health care provider about having an MRI and a mammogram every year.  Breast cancer (BRCA) gene test is recommended for women who have family members with BRCA-related cancers. Results of the assessment will determine the need for genetic counseling and BRCA1 and for BRCA2 testing. BRCA-related cancers include these types: ? Breast. This occurs in males or females. ? Ovarian. ? Tubal. This may also be called fallopian tube cancer. ? Cancer of the abdominal or pelvic lining (peritoneal cancer). ? Prostate. ? Pancreatic.  Cervical, Uterine, and Ovarian Cancer Your health care provider may recommend that you be screened regularly for cancer of the pelvic organs. These include your ovaries, uterus, and vagina. This screening involves a pelvic exam, which includes checking for microscopic changes to the surface of your cervix (Pap test).  For women ages 21-65, health care providers may recommend a pelvic exam and a Pap test every three years. For women ages 79-65, they may recommend the Pap test and pelvic exam, combined with testing for human papilloma virus (HPV), every five years. Some types of HPV increase your risk of cervical cancer. Testing for HPV may also be done on women of any age who have unclear Pap test results.  Other health care providers may not recommend any screening for nonpregnant women who are considered low risk for pelvic cancer and have no symptoms. Ask your health care provider if a screening pelvic exam is right for you.  If you have had past treatment for cervical cancer or a condition that could lead to cancer, you need Pap tests and screening for cancer for at least 20 years after your treatment. If Pap tests have been discontinued for you, your risk factors (such as having a new sexual partner) need to be  reassessed to determine if you should start having screenings again. Some women have medical problems that increase the chance of getting cervical cancer. In these cases, your health care provider may recommend that you have screening and Pap tests more often.  If you have a family history of uterine cancer or ovarian cancer, talk with your health care provider about genetic screening.  If you have vaginal bleeding after reaching menopause, tell your health care provider.  There are currently no reliable tests available to screen for ovarian cancer.  Lung Cancer Lung cancer screening is recommended for adults 69-62 years old who are at high risk for lung cancer because of a history of smoking. A yearly low-dose CT scan of the lungs is recommended if you:  Currently smoke.  Have a history of at least 30 pack-years of smoking and you currently smoke or have quit within the past 15 years. A pack-year is smoking an average of one pack of cigarettes per day for one year.  Yearly screening should:  Continue until it has been 15 years since you quit.  Stop if you develop a health problem that would prevent you from having lung cancer treatment.  Colorectal Cancer  This type of cancer can be detected and can often be prevented.  Routine colorectal cancer screening usually begins at  age 42 and continues through age 45.  If you have risk factors for colon cancer, your health care provider may recommend that you be screened at an earlier age.  If you have a family history of colorectal cancer, talk with your health care provider about genetic screening.  Your health care provider may also recommend using home test kits to check for hidden blood in your stool.  A small camera at the end of a tube can be used to examine your colon directly (sigmoidoscopy or colonoscopy). This is done to check for the earliest forms of colorectal cancer.  Direct examination of the colon should be repeated every  5-10 years until age 71. However, if early forms of precancerous polyps or small growths are found or if you have a family history or genetic risk for colorectal cancer, you may need to be screened more often.  Skin Cancer  Check your skin from head to toe regularly.  Monitor any moles. Be sure to tell your health care provider: ? About any new moles or changes in moles, especially if there is a change in a mole's shape or color. ? If you have a mole that is larger than the size of a pencil eraser.  If any of your family members has a history of skin cancer, especially at a young age, talk with your health care provider about genetic screening.  Always use sunscreen. Apply sunscreen liberally and repeatedly throughout the day.  Whenever you are outside, protect yourself by wearing long sleeves, pants, a wide-brimmed hat, and sunglasses.  What should I know about osteoporosis? Osteoporosis is a condition in which bone destruction happens more quickly than new bone creation. After menopause, you may be at an increased risk for osteoporosis. To help prevent osteoporosis or the bone fractures that can happen because of osteoporosis, the following is recommended:  If you are 46-71 years old, get at least 1,000 mg of calcium and at least 600 mg of vitamin D per day.  If you are older than age 55 but younger than age 65, get at least 1,200 mg of calcium and at least 600 mg of vitamin D per day.  If you are older than age 54, get at least 1,200 mg of calcium and at least 800 mg of vitamin D per day.  Smoking and excessive alcohol intake increase the risk of osteoporosis. Eat foods that are rich in calcium and vitamin D, and do weight-bearing exercises several times each week as directed by your health care provider. What should I know about how menopause affects my mental health? Depression may occur at any age, but it is more common as you become older. Common symptoms of depression  include:  Low or sad mood.  Changes in sleep patterns.  Changes in appetite or eating patterns.  Feeling an overall lack of motivation or enjoyment of activities that you previously enjoyed.  Frequent crying spells.  Talk with your health care provider if you think that you are experiencing depression. What should I know about immunizations? It is important that you get and maintain your immunizations. These include:  Tetanus, diphtheria, and pertussis (Tdap) booster vaccine.  Influenza every year before the flu season begins.  Pneumonia vaccine.  Shingles vaccine.  Your health care provider may also recommend other immunizations. This information is not intended to replace advice given to you by your health care provider. Make sure you discuss any questions you have with your health care provider. Document Released: 12/15/2005  Document Revised: 05/12/2016 Document Reviewed: 07/27/2015 [employer] Interactive Patient Education  2018 [employer]      Atrophic Vaginitis Atrophic vaginitis is a condition in which the tissues that line the vagina become dry and thin. This condition is most common in women who have stopped having regular menstrual periods (menopause). This usually starts when a woman is 45-55 years old. Estrogen helps to keep the vagina moist. It stimulates the vagina to produce a clear fluid that lubricates the vagina for sexual intercourse. This fluid also protects the vagina from infection. Lack of estrogen can cause the lining of the vagina to get thinner and dryer. The vagina may also shrink in size. It may become less elastic. Atrophic vaginitis tends to get worse over time as a woman's estrogen level drops. What are the causes? This condition is caused by the normal drop in estrogen that happens around the time of menopause. What increases the risk? Certain conditions or situations may lower a woman's estrogen level, which increases her risk of atrophic  vaginitis. These include:  Taking medicine that blocks estrogen.  Having ovaries removed surgically.  Being treated for cancer with X-ray treatment (radiation) or medicines (chemotherapy).  Exercising very hard and often.  Having an eating disorder (anorexia).  Giving birth or breastfeeding.  Being over the age of 50.  Smoking.  What are the signs or symptoms? Symptoms of this condition include:  Pain, soreness, or bleeding during sexual intercourse (dyspareunia).  Vaginal burning, irritation, or itching.  Pain or bleeding during a vaginal examination using a speculum (pelvic exam).  Loss of interest in sexual activity.  Having burning pain when passing urine.  Vaginal discharge that is brown or yellow.  In some cases, there are no symptoms. How is this diagnosed? This condition is diagnosed with a medical history and physical exam. This will include a pelvic exam that checks whether the inside of your vagina appears pale, thin, or dry. Rarely, you may also have other tests, including:  A urine test.  A test that checks the acid balance in your vaginal fluid (acid balance test).  How is this treated? Treatment for this condition may depend on the severity of your symptoms. Treatment may include:  Using an over-the-counter vaginal lubricant before you have sexual intercourse.  Using a long-acting vaginal moisturizer.  Using low-dose vaginal estrogen for moderate to severe symptoms that do not respond to other treatments. Options include creams, tablets, and inserts (vaginal rings). Before using vaginal estrogen, tell your health care provider if you have a history of: ? Breast cancer. ? Endometrial cancer. ? Blood clots.  Taking medicines. You may be able to take a daily pill for dyspareunia. Discuss all of the risks of this medicine with your health care provider. It is usually not recommended for women who have a family history or personal history of breast  cancer.  If your symptoms are very mild and you are not sexually active, you may not need treatment. Follow these instructions at home:  Take medicines only as directed by your health care provider. Do not use herbal or alternative medicines unless your health care provider says that you can.  Use over-the-counter creams, lubricants, or moisturizers for dryness only as directed by your health care provider.  If your atrophic vaginitis is caused by menopause, discuss all of your menopausal symptoms and treatment options with your health care provider.  Do not douche.  Do not use products that can make your vagina dry. These include: ?   Scented feminine sprays. ? Scented tampons. ? Scented soaps.  If it hurts to have sex, talk with your sexual partner. Contact a health care provider if:  Your discharge looks different than normal.  Your vagina has an unusual smell.  You have new symptoms.  Your symptoms do not improve with treatment.  Your symptoms get worse. This information is not intended to replace advice given to you by your health care provider. Make sure you discuss any questions you have with your health care provider. Document Released: 03/09/2015 Document Revised: 03/30/2016 Document Reviewed: 10/14/2014 [employer] Interactive Patient Education  2018 [employer]  

## 2018-02-13 NOTE — Progress Notes (Signed)
GYNECOLOGY ANNUAL PHYSICAL EXAM PROGRESS NOTE  Subjective:    Makayla Mason is a 52 y.o. G66P2002 female who presents for an annual exam.  The patient is not currently sexually active.  The patient wears seatbelts: yes. The patient participates in regular exercise: yes (recently started, walking twice daily). Has the patient ever been transfused or tattooed?: no. The patient reports that there is not domestic violence in her life.    1. Hot flashes and night sweats - daily, several times per day.  2. Burning in external vaginal region, radiating to buttock. Notes that she thinks it has occurred after use of a [employer] shower gel, but has also noticed irritation with use of soaps like Dial or [place].    Gynecologic History  Menarche age: 52 No LMP recorded. Patient is postmenopausal. Contraception: post menopausal status History of STI's: Denies Last Pap: 01/2017. Results were: LGSIL. Followed by normal colposcopy.  Notes no prior h/o abnormal pap smears . Last mammogram: 02/22/2017. Results were: normal Last colonoscopy: patient has never had one.   OB History  Gravida Para Term Preterm AB Living  2 2 2  0 0 2  SAB TAB Ectopic Multiple Live Births  0 0 0 0 2    # Outcome Date GA Lbr Len/2nd Weight Sex Delivery Anes PTL Lv  2 Term 1997    F Vag-Spont   LIV  1 Term 1995    F Vag-Spont   LIV    Past Medical History:  Diagnosis Date  . Allergic rhinitis, seasonal   . Mild scoliosis   . TMJ arthralgia    right side    History reviewed. No pertinent surgical history.  Family History  Problem Relation Age of Onset  . Breast cancer Cousin   . Diabetes Mother   . Diabetes Father   . Hypertension Father   . Asthma Daughter   . Anemia Maternal Aunt   . Stroke Maternal Aunt   . Heart disease Maternal Grandmother   . Kidney disease Maternal Grandmother   . Hypertension Maternal Grandmother     Social History   Socioeconomic History  . Marital status:  Divorced    Spouse name: Not on file  . Number of children: Not on file  . Years of education: Not on file  . Highest education level: Not on file  Occupational History  . Not on file  Social Needs  . Financial resource strain: Not on file  . Food insecurity:    Worry: Not on file    Inability: Not on file  . Transportation needs:    Medical: Not on file    Non-medical: Not on file  Tobacco Use  . Smoking status: Never Smoker  . Smokeless tobacco: Never Used  Substance and Sexual Activity  . Alcohol use: Yes    Alcohol/week: 0.0 oz    Comment: occasional  . Drug use: No  . Sexual activity: Not Currently    Birth control/protection: Post-menopausal  Lifestyle  . Physical activity:    Days per week: Not on file    Minutes per session: Not on file  . Stress: Not on file  Relationships  . Social connections:    Talks on phone: Not on file    Gets together: Not on file    Attends religious service: Not on file    Active member of club or organization: Not on file    Attends meetings of clubs or organizations: Not  on file    Relationship status: Not on file  . Intimate partner violence:    Fear of current or ex partner: Not on file    Emotionally abused: Not on file    Physically abused: Not on file    Forced sexual activity: Not on file  Other Topics Concern  . Not on file  Social History Narrative  . Not on file    Current Outpatient Medications on File Prior to Visit  Medication Sig Dispense Refill  . fexofenadine (ALLEGRA ALLERGY) 180 MG tablet Take 1 tablet (180 mg total) by mouth daily. 90 tablet 1  . omeprazole (PRILOSEC) 40 MG capsule Take 1 capsule (40 mg total) by mouth daily. 90 capsule 1  . triamcinolone (NASACORT ALLERGY 24HR) 55 MCG/ACT AERO nasal inhaler Place 2 sprays into the nose daily. 1 Inhaler 3   No current facility-administered medications on file prior to visit.     Allergies  Allergen Reactions  . Codeine Nausea And Vomiting      Review of Systems Constitutional: negative for chills, fatigue, fevers and sweats Eyes: negative for irritation, redness and visual disturbance Ears, nose, mouth, throat, and face: negative for hearing loss, nasal congestion, snoring and tinnitus Respiratory: negative for asthma, cough, sputum Cardiovascular: negative for chest pain, dyspnea, exertional chest pressure/discomfort, irregular heart beat, palpitations and syncope Gastrointestinal: negative for abdominal pain, change in bowel habits, nausea and vomiting Genitourinary: negative for abnormal menstrual periods, genital lesions, sexual problems and vaginal discharge, dysuria and urinary incontinence.   Positive for vulvar/vaginal and buttock skin irritation Integument/breast: negative for breast lump, breast tenderness and nipple discharge Hematologic/lymphatic: negative for bleeding and easy bruising Musculoskeletal:negative for back pain and muscle weakness Neurological: negative for dizziness, headaches, vertigo and weakness Endocrine: negative for diabetic symptoms including polydipsia, polyuria and skin dryness.  Positive for night sweats and hot flushes.  Allergic/Immunologic: negative for hay fever and urticaria       Objective:  Blood pressure 129/89, pulse 81, height 5\' 3"  (1.6 m), weight 153 lb 11.2 oz (69.7 kg). Body mass index is 27.23 kg/m.    General Appearance:    Alert, cooperative, no distress, appears stated age, overweight  Head:    Normocephalic, without obvious abnormality, atraumatic  Eyes:    PERRL, conjunctiva/corneas clear, EOM's intact, both eyes  Ears:    Normal external ear canals, both ears  Nose:   Nares normal, septum midline, mucosa normal, no drainage or sinus tenderness  Throat:   Lips, mucosa, and tongue normal; teeth and gums normal  Neck:   Supple, symmetrical, trachea midline, no adenopathy; thyroid: no enlargement/tenderness/nodules; no carotid bruit or JVD  Back:     Symmetric, no  curvature, ROM normal, no CVA tenderness  Lungs:     Clear to auscultation bilaterally, respirations unlabored  Chest Wall:    No tenderness or deformity   Heart:    Regular rate and rhythm, S1 and S2 normal, no murmur, rub or gallop  Breast Exam:    No tenderness, masses, or nipple abnormality  Abdomen:     Soft, non-tender, bowel sounds active all four quadrants, no masses, no organomegaly.    Genitalia:    Pelvic:external genitalia normal, vagina without lesions, discharge, or tenderness, however mild vaginal atrophy present. Rectovaginal septum  normal. Cervix normal in appearance, with some stenosis of the cervical os appreciated.  No cervical motion tenderness, no adnexal masses or tenderness.  Uterus normal size, shape, mobile, regular contours, nontender.  Rectal:  Normal external sphincter.  No hemorrhoids appreciated. Internal exam not done.   Extremities:   Extremities normal, atraumatic, no cyanosis or edema  Pulses:   2+ and symmetric all extremities  Skin:   Skin turgor normal.  Some darkening and slight thickening of the skin at areas of the folds of the buttock also present anteriorly near the groin.   Lymph nodes:   Cervical, supraclavicular, and axillary nodes normal  Neurologic:   CNII-XII intact, normal strength, sensation and reflexes throughout   .  Labs:  Lab Results  Component Value Date   WBC 8.3 02/01/2017   HGB 14.1 02/01/2017   HCT 43.7 02/01/2017   MCV 89.0 02/01/2017   PLT 358 02/01/2017    Lab Results  Component Value Date   CREATININE 0.72 01/29/2018   BUN 15 01/29/2018   NA 142 01/29/2018   K 4.2 01/29/2018   CL 104 01/29/2018   CO2 27 01/29/2018    Lab Results  Component Value Date   ALT 14 01/29/2018   AST 15 01/29/2018   ALKPHOS 88 02/01/2017   BILITOT 0.3 01/29/2018    No results found for: TSH   Assessment:   Routine gynecologic exam.  Menopausal syndrome Vaginal atrophy Skin irritation Overweight   Plan:    - Blood  tests: None ordered. Patient has annual labs done with PCP. - Breast self exam technique reviewed and patient encouraged to perform self-exam monthly. Mammogram ordered.  - Contraception: post menopausal status. - Discussed healthy lifestyle modifications. - Pap smear performed today due to h/o abnormal pap smear last year (LSIL).  - Answered all questions regarding patient's last pap smear results, HPV virus, and screening recommendations.  - Patient with bothersome menopausal vasomotor symptoms. Discussed lifestyle interventions such as wearing light clothing, remaining in cool environments, having fan/air conditioner in the room, avoiding hot beverages etc.  Discussed using hormone therapy and concerns about increased risk of heart disease, cerebrovascular disease, thromboembolic disease,  and breast cancer.  Also discussed other medical options such as Paxil, Effexor, Brisdelle, Clonidine,  or Neurontin.   Also discussed alternative therapies such as herbal remedies.  Patient opted for combined estrogen/progestin hormone therapy. For now, wants to try the oral formulation.  Estradiol 0.5 mg daily samples given, (2 week trial) and Prempro prescribed if patient desires to continue hormone use.  She will return in 2 months for reevaluation. - Vaginal atrophy - symptoms will likely be improved with use of HRT (see above bullet point).   - Skin irritation - discussed use of more gentle/pH balanced soaps in the vaginal area, such as Vagisil, Summer's Eve.  Will also prescribe hydrocortisone cream to treat externally around the vaginal and buttock regions where irritation is most frequent.  - Colonoscopy - can order if patient is out of date.  - F/u in 1 year for annual exam.      Rubie Maid, MD [facility]

## 2018-02-14 NOTE — Addendum Note (Signed)
Addended by: Edwyna Shell on: 02/14/2018 04:07 PM   Modules accepted: Orders

## 2018-02-15 ENCOUNTER — Ambulatory Visit
Admission: RE | Admit: 2018-02-15 | Discharge: 2018-02-15 | Disposition: A | Discharge status: Home / Self Care | Payer: 59 | Source: Ambulatory Visit | Attending: Gastroenterology | Admitting: Gastroenterology

## 2018-02-15 ENCOUNTER — Ambulatory Visit: Payer: 59 | Admitting: Certified Registered"

## 2018-02-15 ENCOUNTER — Encounter
Admission: RE | Disposition: A | Discharge status: Home / Self Care | Payer: Self-pay | Source: Ambulatory Visit | Attending: Gastroenterology

## 2018-02-15 ENCOUNTER — Encounter: Payer: Self-pay | Admitting: *Deleted

## 2018-02-15 DIAGNOSIS — J302 Other seasonal allergic rhinitis: Secondary | ICD-10-CM | POA: Diagnosis not present

## 2018-02-15 DIAGNOSIS — K635 Polyp of colon: Secondary | ICD-10-CM | POA: Diagnosis not present

## 2018-02-15 DIAGNOSIS — D125 Benign neoplasm of sigmoid colon: Secondary | ICD-10-CM | POA: Diagnosis not present

## 2018-02-15 DIAGNOSIS — K922 Gastrointestinal hemorrhage, unspecified: Secondary | ICD-10-CM | POA: Insufficient documentation

## 2018-02-15 DIAGNOSIS — Z1211 Encounter for screening for malignant neoplasm of colon: Secondary | ICD-10-CM | POA: Diagnosis not present

## 2018-02-15 DIAGNOSIS — E119 Type 2 diabetes mellitus without complications: Secondary | ICD-10-CM | POA: Insufficient documentation

## 2018-02-15 DIAGNOSIS — K219 Gastro-esophageal reflux disease without esophagitis: Secondary | ICD-10-CM | POA: Insufficient documentation

## 2018-02-15 HISTORY — PX: COLONOSCOPY WITH PROPOFOL: SHX5780

## 2018-02-15 SURGERY — COLONOSCOPY WITH PROPOFOL
Anesthesia: General

## 2018-02-15 MED ORDER — LIDOCAINE HCL (CARDIAC) 20 MG/ML IV SOLN
INTRAVENOUS | Status: DC | PRN
  Administered 2018-02-15: 100 mg via INTRATRACHEAL

## 2018-02-15 MED ORDER — PROPOFOL 500 MG/50ML IV EMUL
INTRAVENOUS | Status: DC | PRN
  Administered 2018-02-15: 200 ug/kg/min via INTRAVENOUS

## 2018-02-15 MED ORDER — SODIUM CHLORIDE 0.9 % IV SOLN
INTRAVENOUS | Status: DC
  Administered 2018-02-15: 1000 mL via INTRAVENOUS

## 2018-02-15 MED ORDER — PROPOFOL 10 MG/ML IV BOLUS
INTRAVENOUS | Status: DC | PRN
  Administered 2018-02-15: 100 mg via INTRAVENOUS

## 2018-02-15 NOTE — Op Note (Signed)
[facility] Gastroenterology Patient Name: Makayla Mason Procedure Date: 02/15/2018 10:27 AM MRN: [id number] Account #: [id number] Date of Birth: Jun 16, 1966 Admit Type: Outpatient Age: 52 Room: [facility] ENDO ROOM 4 Gender: Female Note Status: Finalized Procedure:            Colonoscopy Indications:          Screening for colorectal malignant neoplasm Providers:            Jonathon Bellows MD, MD Medicines:            Monitored Anesthesia Care Complications:        No immediate complications. Procedure:            Pre-Anesthesia Assessment:                       - Prior to the procedure, a History and Physical was                        performed, and patient medications, allergies and                        sensitivities were reviewed. The patient's tolerance of                        previous anesthesia was reviewed.                       - The risks and benefits of the procedure and the                        sedation options and risks were discussed with the                        patient. All questions were answered and informed                        consent was obtained.                       - ASA Grade Assessment: II - A patient with mild                        systemic disease.                       After obtaining informed consent, the colonoscope was                        passed under direct vision. Throughout the procedure,                        the patient's blood pressure, pulse, and oxygen                        saturations were monitored continuously. The                        Colonoscope was introduced through the anus and                        advanced to the the cecum, identified by the  appendiceal orifice, IC valve and transillumination.                        The colonoscopy was performed with ease. The patient                        tolerated the procedure well. The quality of the bowel                        preparation was  adequate. Findings:      The perianal and digital rectal examinations were normal.      A 5 mm polyp was found in the sigmoid colon. The polyp was sessile. The       polyp was removed with a cold snare. Resection and retrieval were       complete.      The exam was otherwise without abnormality on direct and retroflexion       views. Impression:           - One 5 mm polyp in the sigmoid colon, removed with a                        cold snare. Resected and retrieved.                       - The examination was otherwise normal on direct and                        retroflexion views. Recommendation:       - Discharge patient to home (with escort).                       - Resume previous diet.                       - Continue present medications.                       - Await pathology results.                       - Repeat colonoscopy in 5-10 years for surveillance                        based on pathology results. Procedure Code(s):    --- Professional ---                       [phone], Colonoscopy, flexible; with removal of tumor(s),                        polyp(s), or other lesion(s) by snare technique Diagnosis Code(s):    --- Professional ---                       Z12.11, Encounter for screening for malignant neoplasm                        of colon                       D12.5, Benign neoplasm of sigmoid colon CPT copyright 2017 American Medical Association. All rights reserved. The codes  documented in this report are preliminary and upon coder review may  be revised to meet current compliance requirements. Jonathon Bellows, MD Jonathon Bellows MD, MD 02/15/2018 11:00:56 AM This report has been signed electronically. Number of Addenda: 0 Note Initiated On: 02/15/2018 10:27 AM Scope Withdrawal Time: 0 hours 12 minutes 14 seconds  Total Procedure Duration: 0 hours 17 minutes 13 seconds       [facility]

## 2018-02-15 NOTE — Transfer of Care (Signed)
Immediate Anesthesia Transfer of Care Note  Patient: Makayla Mason  Procedure(s) Performed: COLONOSCOPY WITH PROPOFOL (N/A )  Patient Location: PACU and Endoscopy Unit  Anesthesia Type:General  Level of Consciousness: awake  Airway & Oxygen Therapy: Patient Spontanous Breathing  Post-op Assessment: Report given to RN  Post vital signs: stable  Last Vitals:  Vitals Value Taken Time  BP 135/94 02/15/2018 11:04 AM  Temp    Pulse 111 02/15/2018 11:05 AM  Resp 21 02/15/2018 11:05 AM  SpO2 100 % 02/15/2018 11:05 AM  Vitals shown include unvalidated device data.  Last Pain:  Vitals:   02/15/18 1010  TempSrc: Tympanic  PainSc: 0-No pain         Complications: No apparent anesthesia complications

## 2018-02-15 NOTE — Anesthesia Preprocedure Evaluation (Signed)
Anesthesia Evaluation  Patient identified by MRN, date of birth, ID band Patient awake    Reviewed: Allergy & Precautions, H&P , NPO status , Patient's Chart, lab work & pertinent test results  History of Anesthesia Complications Negative for: history of anesthetic complications  Airway Mallampati: III  TM Distance: >3 FB Neck ROM: full    Dental  (+) Chipped   Pulmonary neg pulmonary ROS, neg shortness of breath,           Cardiovascular Exercise Tolerance: Good (-) angina(-) Past MI and (-) DOE negative cardio ROS       Neuro/Psych  Headaches, negative psych ROS   GI/Hepatic Neg liver ROS, GERD  Medicated and Controlled,  Endo/Other  diabetes, Well Controlled, Type 2  Renal/GU negative Renal ROS  negative genitourinary   Musculoskeletal   Abdominal   Peds  Hematology negative hematology ROS (+)   Anesthesia Other Findings Signs and symptoms suggestive of sleep apnea   Past Medical History: No date: Allergic rhinitis, seasonal No date: Mild scoliosis No date: TMJ arthralgia     Comment:  right side  History reviewed. No pertinent surgical history.  BMI    Body Mass Index:  26.93 kg/m      Reproductive/Obstetrics negative OB ROS                             Anesthesia Physical Anesthesia Plan  ASA: III  Anesthesia Plan: General   Post-op Pain Management:    Induction: Intravenous  PONV Risk Score and Plan: Propofol infusion and TIVA  Airway Management Planned: Natural Airway and Nasal Cannula  Additional Equipment:   Intra-op Plan:   Post-operative Plan:   Informed Consent: I have reviewed the patients History and Physical, chart, labs and discussed the procedure including the risks, benefits and alternatives for the proposed anesthesia with the patient or authorized representative who has indicated his/her understanding and acceptance.   Dental Advisory  Given  Plan Discussed with: Anesthesiologist, CRNA and Surgeon  Anesthesia Plan Comments: (Patient consented for risks of anesthesia including but not limited to:  - adverse reactions to medications - risk of intubation if required - damage to teeth, lips or other oral mucosa - sore throat or hoarseness - Damage to heart, brain, lungs or loss of life  Patient voiced understanding.)        Anesthesia Quick Evaluation

## 2018-02-15 NOTE — H&P (Signed)
Jonathon Bellows, MD [address] [place], [place], [place], [address] [address], [place], [place], [place], [address] Phone: [phone]  Fax: [phone]  Primary Care Physician:  Steele Sizer, MD   Pre-Procedure History & Physical: HPI:  Makayla Mason is a 52 y.o. female is here for an colonoscopy.   Past Medical History:  Diagnosis Date  . Allergic rhinitis, seasonal   . Mild scoliosis   . TMJ arthralgia    right side    History reviewed. No pertinent surgical history.  Prior to Admission medications   Medication Sig Start Date End Date Taking? Authorizing Provider  estrogen, conjugated,-medroxyprogesterone (PREMPRO) 0.625-2.5 MG tablet Take 1 tablet by mouth daily. 02/13/18   Rubie Maid, MD  fexofenadine [facility] ALLERGY) 180 MG tablet Take 1 tablet (180 mg total) by mouth daily. 01/29/18   Hubbard Hartshorn, FNP  hydrocortisone cream 0.5 % Apply 1 application topically 2 (two) times daily. 02/13/18   Rubie Maid, MD  omeprazole (PRILOSEC) 40 MG capsule Take 1 capsule (40 mg total) by mouth daily. 12/03/17   Steele Sizer, MD  triamcinolone (NASACORT ALLERGY 24HR) 55 MCG/ACT AERO nasal inhaler Place 2 sprays into the nose daily. 01/29/18   Hubbard Hartshorn, FNP    Allergies as of 02/05/2018 - Review Complete 01/29/2018  Allergen Reaction Noted  . Codeine Nausea And Vomiting 05/21/2015    Family History  Problem Relation Age of Onset  . Breast cancer Cousin   . Diabetes Mother   . Diabetes Father   . Hypertension Father   . Asthma Daughter   . Anemia Maternal Aunt   . Stroke Maternal Aunt   . Heart disease Maternal Grandmother   . Kidney disease Maternal Grandmother   . Hypertension Maternal Grandmother     Social History   Socioeconomic History  . Marital status: Divorced    Spouse name: Not on file  . Number of children: Not on file  . Years of education: Not on file  . Highest education level: Not on file  Occupational History  . Not on  file  Social Needs  . Financial resource strain: Not on file  . Food insecurity:    Worry: Not on file    Inability: Not on file  . Transportation needs:    Medical: Not on file    Non-medical: Not on file  Tobacco Use  . Smoking status: Never Smoker  . Smokeless tobacco: Never Used  Substance and Sexual Activity  . Alcohol use: Yes    Alcohol/week: 0.0 oz    Comment: occasional  . Drug use: No  . Sexual activity: Not Currently    Birth control/protection: Post-menopausal  Lifestyle  . Physical activity:    Days per week: Not on file    Minutes per session: Not on file  . Stress: Not on file  Relationships  . Social connections:    Talks on phone: Not on file    Gets together: Not on file    Attends religious service: Not on file    Active member of club or organization: Not on file    Attends meetings of clubs or organizations: Not on file    Relationship status: Not on file  . Intimate partner violence:    Fear of current or ex partner: Not on file    Emotionally abused: Not on file    Physically abused: Not on file    Forced sexual activity: Not on file  Other  Topics Concern  . Not on file  Social History Narrative  . Not on file    Review of Systems: See HPI, otherwise negative ROS  Physical Exam: BP 121/66   Pulse 91   Temp (!) 97.1 F (36.2 C) (Tympanic)   Resp 18   Ht 5\' 3"  (1.6 m)   Wt 152 lb (68.9 kg)   SpO2 97%   BMI 26.93 kg/m  General:   Alert,  pleasant and cooperative in NAD Head:  Normocephalic and atraumatic. Neck:  Supple; no masses or thyromegaly. Lungs:  Clear throughout to auscultation, normal respiratory effort.    Heart:  +S1, +S2, Regular rate and rhythm, No edema. Abdomen:  Soft, nontender and nondistended. Normal bowel sounds, without guarding, and without rebound.   Neurologic:  Alert and  oriented x4;  grossly normal neurologically.  Impression/Plan: Makayla Mason is here for an colonoscopy to be performed for Screening  colonoscopy average risk   Risks, benefits, limitations, and alternatives regarding  colonoscopy have been reviewed with the patient.  Questions have been answered.  All parties agreeable.   Jonathon Bellows, MD  02/15/2018, 10:24 AM

## 2018-02-15 NOTE — Anesthesia Postprocedure Evaluation (Signed)
Anesthesia Post Note  Patient: Makayla Mason  Procedure(s) Performed: COLONOSCOPY WITH PROPOFOL (N/A )  Patient location during evaluation: Endoscopy Anesthesia Type: General Level of consciousness: awake and alert Pain management: pain level controlled Vital Signs Assessment: post-procedure vital signs reviewed and stable Respiratory status: spontaneous breathing, nonlabored ventilation, respiratory function stable and patient connected to nasal cannula oxygen Cardiovascular status: blood pressure returned to baseline and stable Postop Assessment: no apparent nausea or vomiting Anesthetic complications: no     Last Vitals:  Vitals:   02/15/18 1120 02/15/18 1130  BP: 133/88 136/81  Pulse: 94 93  Resp: 16 (!) 27  Temp:    SpO2: 100% 100%    Last Pain:  Vitals:   02/15/18 1100  TempSrc: Tympanic  PainSc:                  Precious Haws Piscitello

## 2018-02-15 NOTE — Anesthesia Post-op Follow-up Note (Signed)
Anesthesia QCDR form completed.        

## 2018-02-18 LAB — IGP, COBASHPV16/18
HPV 16: NEGATIVE
HPV 18: NEGATIVE
HPV other hr types: POSITIVE — AB
PAP Smear Comment: 0

## 2018-02-19 ENCOUNTER — Ambulatory Visit: Payer: 59 | Attending: Neurology

## 2018-02-19 ENCOUNTER — Telehealth: Payer: Self-pay | Admitting: Obstetrics and Gynecology

## 2018-02-19 DIAGNOSIS — R0683 Snoring: Secondary | ICD-10-CM | POA: Insufficient documentation

## 2018-02-19 DIAGNOSIS — G4733 Obstructive sleep apnea (adult) (pediatric): Secondary | ICD-10-CM | POA: Diagnosis present

## 2018-02-19 DIAGNOSIS — G4730 Sleep apnea, unspecified: Secondary | ICD-10-CM | POA: Diagnosis not present

## 2018-02-19 DIAGNOSIS — G4761 Periodic limb movement disorder: Secondary | ICD-10-CM | POA: Diagnosis not present

## 2018-02-19 LAB — SURGICAL PATHOLOGY

## 2018-02-19 NOTE — Telephone Encounter (Signed)
Patient called stating she was supposed to have a referral to [facility]. I do not see one entered. Please Advise.

## 2018-02-20 NOTE — Telephone Encounter (Signed)
Pt was called back and informed that [facility] was still working on her referral back to her old PCP since she had been a no call no show several times there. Pt stated that she understood.

## 2018-02-21 ENCOUNTER — Telehealth: Payer: Self-pay | Admitting: Family Medicine

## 2018-02-21 NOTE — Telephone Encounter (Signed)
Copied from [place] [id number]. Topic: Quick Communication - See Telephone Encounter >> Feb 21, 2018  4:55 PM Rutherford Nail, NT wrote: CRM for notification. See Telephone encounter for: 02/21/18. Patient calling and states that she would like a call back from Dr. Ancil Boozer to talk about her sleep apnea. States that she would like to see an ear, nose, and throat dr. Valene Bors said they would not see her because she missed 2 of her previous appointments. States she was having a funeral for her father. She also stated that she has a polyp in her nose and that is why she is not able to sleep. Would like a return call to discuss. CB#: [phone]

## 2018-02-22 ENCOUNTER — Telehealth: Payer: Self-pay | Admitting: Family Medicine

## 2018-02-22 NOTE — Telephone Encounter (Signed)
Copied from [place] [id number]. Topic: Quick Communication - See Telephone Encounter >> Feb 22, 2018 10:27 AM Rutherford Nail, NT wrote: CRM for notification. See Telephone encounter for: 02/22/18. Patient calling and states she needs a call back about her results. States her Midwife told her that she had new results in her chart and she states that she has received all other results, except the sleep apnea results. CB# [phone]

## 2018-02-25 NOTE — Telephone Encounter (Signed)
Patient notified we still do not have the results can she can call [facility] to inquire how long it will take to receive the results.

## 2018-02-25 NOTE — Telephone Encounter (Signed)
I do not have the sleep study result yet

## 2018-02-25 NOTE — Telephone Encounter (Signed)
Please check to see if she is willing to go to [facility]

## 2018-02-25 NOTE — Telephone Encounter (Signed)
We have previously discussed other ENT offices, but patient wants to be seen at [facility]. We have encouraged her on several occassions to speak with their office manage at the requested ENT so she can explain her situation since their is nothing we can do on our end, but I assume she has not.   Referral will be sent to [facility].

## 2018-03-03 ENCOUNTER — Encounter: Payer: Self-pay | Admitting: Gastroenterology

## 2018-03-04 ENCOUNTER — Ambulatory Visit
Admission: RE | Admit: 2018-03-04 | Discharge: 2018-03-04 | Disposition: A | Discharge status: Home / Self Care | Payer: 59 | Source: Ambulatory Visit | Attending: Family Medicine | Admitting: Family Medicine

## 2018-03-04 ENCOUNTER — Other Ambulatory Visit: Payer: Self-pay

## 2018-03-04 DIAGNOSIS — Z1231 Encounter for screening mammogram for malignant neoplasm of breast: Secondary | ICD-10-CM | POA: Diagnosis not present

## 2018-03-12 ENCOUNTER — Telehealth: Payer: Self-pay | Admitting: Obstetrics and Gynecology

## 2018-03-12 ENCOUNTER — Telehealth: Payer: Self-pay | Admitting: Family Medicine

## 2018-03-12 NOTE — Telephone Encounter (Signed)
I called patient and told her to call her GYN for the pap smear results.

## 2018-03-12 NOTE — Telephone Encounter (Signed)
Copied from [place] [id number]. Topic: Quick Communication - See Telephone Encounter >> Mar 12, 2018  9:39 AM Bea Graff, NT wrote: CRM for notification. See Telephone encounter for: 03/12/18. Pt would like a call with her pap smear results.

## 2018-03-12 NOTE — Telephone Encounter (Signed)
Patient called requesting pap results. She stating she doesn't really know how to use mychart. Thanks

## 2018-03-13 NOTE — Telephone Encounter (Signed)
Pt was called and went over test results.

## 2018-04-02 DIAGNOSIS — J209 Acute bronchitis, unspecified: Secondary | ICD-10-CM | POA: Diagnosis not present

## 2018-04-02 DIAGNOSIS — J019 Acute sinusitis, unspecified: Secondary | ICD-10-CM | POA: Diagnosis not present

## 2018-04-03 ENCOUNTER — Telehealth: Payer: Self-pay | Admitting: Family Medicine

## 2018-04-03 NOTE — Telephone Encounter (Signed)
Copied from [place] [id number]. Topic: Quick Communication - See Telephone Encounter >> Apr 03, 2018  2:47 PM Synthia Innocent wrote: CRM for notification. See Telephone encounter for: 04/03/18. Patient requesting allergy injections monthly, please advise.

## 2018-04-03 NOTE — Telephone Encounter (Signed)
I don't do allergy injections, only ENT or allergist can do that

## 2018-04-04 NOTE — Telephone Encounter (Signed)
Left a message we are unable to do allergy shots but could call her allergist doctor about starting them.

## 2018-04-12 ENCOUNTER — Encounter: Payer: Self-pay | Admitting: Nurse Practitioner

## 2018-04-12 ENCOUNTER — Ambulatory Visit (INDEPENDENT_AMBULATORY_CARE_PROVIDER_SITE_OTHER): Payer: 59 | Admitting: Nurse Practitioner

## 2018-04-12 VITALS — BP 110/70 | HR 75 | Temp 97.6°F | Resp 16 | Ht 63.0 in | Wt 151.7 lb

## 2018-04-12 DIAGNOSIS — J069 Acute upper respiratory infection, unspecified: Secondary | ICD-10-CM

## 2018-04-12 DIAGNOSIS — K219 Gastro-esophageal reflux disease without esophagitis: Secondary | ICD-10-CM | POA: Diagnosis not present

## 2018-04-12 DIAGNOSIS — R05 Cough: Secondary | ICD-10-CM | POA: Diagnosis not present

## 2018-04-12 DIAGNOSIS — R059 Cough, unspecified: Secondary | ICD-10-CM

## 2018-04-12 DIAGNOSIS — J302 Other seasonal allergic rhinitis: Secondary | ICD-10-CM

## 2018-04-12 MED ORDER — PROMETHAZINE-DM 6.25-15 MG/5ML PO SYRP
2.5000 mL | ORAL_SOLUTION | Freq: Two times a day (BID) | ORAL | 0 refills | Status: DC
Start: 2018-04-12 — End: 2018-06-03

## 2018-04-12 MED ORDER — OMEPRAZOLE 40 MG PO CPDR: 40.0000 mg | DELAYED_RELEASE_CAPSULE | Freq: Every day | ORAL | 1 refills | Status: DC

## 2018-04-12 MED ORDER — MONTELUKAST SODIUM 10 MG PO TABS: 10.0000 mg | ORAL_TABLET | Freq: Every day | ORAL | 3 refills | Status: DC

## 2018-04-12 MED ORDER — LEVOCETIRIZINE DIHYDROCHLORIDE 5 MG PO TABS: 5.0000 mg | ORAL_TABLET | Freq: Every evening | ORAL | 3 refills | Status: DC

## 2018-04-12 NOTE — Patient Instructions (Addendum)
-   Monitor food triggers for acid reflux and then read info below  -Take singulair daily and switch to levocetrizine from your fexofenadine - Use neti pot - refer to allergist  - If still unimproved in one week follow up   [place] for Gastroesophageal Reflux Disease, Adult When you have gastroesophageal reflux disease (GERD), the foods you eat and your eating habits are very important. Choosing the right foods can help ease your discomfort. What guidelines do I need to follow?  Choose fruits, vegetables, whole grains, and low-fat dairy products.  Choose low-fat meat, fish, and poultry.  Limit fats such as oils, salad dressings, butter, nuts, and avocado.  Keep a food diary. This helps you identify foods that cause symptoms.  Avoid foods that cause symptoms. These may be different for everyone.  Eat small meals often instead of 3 large meals a day.  Eat your meals slowly, in a place where you are relaxed.  Limit fried foods.  Cook foods using methods other than frying.  Avoid drinking alcohol.  Avoid drinking large amounts of liquids with your meals.  Avoid bending over or lying down until 2-3 hours after eating. What foods are not recommended? These are some foods and drinks that may make your symptoms worse: Vegetables Tomatoes. Tomato juice. Tomato and spaghetti sauce. Chili peppers. Onion and garlic. Horseradish. Fruits Oranges, grapefruit, and lemon (fruit and juice). Meats High-fat meats, fish, and poultry. This includes hot dogs, ribs, ham, sausage, salami, and bacon. Dairy Whole milk and chocolate milk. Sour cream. Cream. Butter. Ice cream. Cream cheese. Drinks Coffee and tea. Bubbly (carbonated) drinks or energy drinks. Condiments Hot sauce. Barbecue sauce. Sweets/Desserts Chocolate and cocoa. Donuts. Peppermint and spearmint. Fats and Oils High-fat foods. This includes [place] fries and potato chips. Other Vinegar. Strong spices. This includes black  pepper, white pepper, red pepper, cayenne, curry powder, cloves, ginger, and chili powder. The items listed above may not be a complete list of foods and drinks to avoid. Contact your dietitian for more information. This information is not intended to replace advice given to you by your health care provider. Make sure you discuss any questions you have with your health care provider. Document Released: 04/23/2012 Document Revised: 03/30/2016 Document Reviewed: 08/27/2013 Elsevier Interactive Patient Education  2017 [employer].

## 2018-04-12 NOTE — Progress Notes (Addendum)
Name: Makayla Mason   MRN: [id number]    DOB: 1966-11-05   Date:04/12/2018       Progress Note  Subjective  Chief Complaint  Chief Complaint  Patient presents with  . Sinusitis    evaluated at [facility] and treated with Minocycline. Stopped taking after 2 days because it made her feel weak.    HPI  Pt endorses persistent cough, lots of nasal discharge initially- had blood tinged when she blew her nose last week- sts not blowing her nose a lot anymore more congested. Taking nasocort, fexofenadine daily with mild relief of symptoms. Patient endorses hoarseness. Was seen at urgent care last week and placed on minocycline but noted that it was making her weak and stopped taking after 2 days. Patient sts overall doesn't feel like allergy medicine has been helping. No fevers, chills, shob, chest pain, facial pain.   GERD: sts unknown triggers; sts controlled on medications.  Patient Active Problem List   Diagnosis Date Noted  . Overweight (BMI 25.0-29.9) 01/29/2018  . Right hip pain 01/29/2018  . Insulin resistance 02/28/2017  . Low grade squamous intraepith lesion on cytologic smear cervix (lgsil) 02/28/2017  . GERD without esophagitis 03/15/2016  . Left ankle pain 03/15/2016  . Allergic rhinitis, seasonal 05/21/2015  . Scoliosis 05/21/2015  . 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 Allergic rhinitis, seasonal   . Mild scoliosis   . TMJ arthralgia    right side    Past Surgical History:  Procedure Laterality Date  . COLONOSCOPY WITH PROPOFOL N/A 02/15/2018   Procedure: COLONOSCOPY WITH PROPOFOL;  Surgeon: Jonathon Bellows, MD;  Location: [facility] ENDOSCOPY;  Service: Gastroenterology;  Laterality: N/A;    Social History   Tobacco Use  . Smoking status: Never Smoker  . Smokeless tobacco: Never Used  Substance Use Topics  . Alcohol use: Yes    Alcohol/week: 0.0 oz    Comment: occasional     Current Outpatient Medications:  .   estrogen, conjugated,-medroxyprogesterone (PREMPRO) 0.625-2.5 MG tablet, Take 1 tablet by mouth daily., Disp: 30 tablet, Rfl: 11 .  fexofenadine (ALLEGRA ALLERGY) 180 MG tablet, Take 1 tablet (180 mg total) by mouth daily., Disp: 90 tablet, Rfl: 1 .  hydrocortisone cream 0.5 %, Apply 1 application topically 2 (two) times daily., Disp: 30 g, Rfl: 0 .  triamcinolone (NASACORT ALLERGY 24HR) 55 MCG/ACT AERO nasal inhaler, Place 2 sprays into the nose daily., Disp: 1 Inhaler, Rfl: 3  Allergies  Allergen Reactions  . Codeine Nausea And Vomiting    ROS  .  No other specific complaints in a complete review of systems (except as listed in HPI above).  Objective  Vitals:   04/12/18 0927  BP: 110/70  Pulse: 75  Resp: 16  Temp: 97.6 F (36.4 C)  TempSrc: Oral  SpO2: 98%  Weight: 151 lb 11.2 oz (68.8 kg)  Height: 5\' 3"  (1.6 m)    Body mass index is 26.87 kg/m.  Nursing Note and Vital Signs reviewed.  Physical Exam  Constitutional: Patient appears well-developed and well-nourished.  No distress.  HEENT: head atraumatic, normocephalic, pupils equal and reactive to light, TM's without erythema or bulging,  no maxillary or frontal sinus tenderness , neck supple without lymphadenopathy, oropharynx pink and moist without exudate, no nasal discharge Cardiovascular: Normal rate, regular rhythm, S1/S2 present.  No murmur or rub heard.  Pulmonary/Chest: Effort normal and breath sounds clear. No respiratory distress or retractions. Psychiatric: Patient  has a normal mood and affect. behavior is normal. Judgment and thought content normal.  No results found for this or any previous visit (from the past 72 hour(s)).  Assessment & Plan  1. Seasonal allergic rhinitis, unspecified trigger - Ambulatory referral to Allergy - montelukast (SINGULAIR) 10 MG tablet; Take 1 tablet (10 mg total) by mouth at bedtime.  Dispense: 30 tablet; Refill: 3 - levocetirizine (XYZAL) 5 MG tablet; Take 1 tablet (5 mg  total) by mouth every evening.  Dispense: 30 tablet; Refill: 3  2. Upper respiratory tract infection, unspecified type - appears to be resolving cough and congestion still present, negative physical exam  - montelukast (SINGULAIR) 10 MG tablet; Take 1 tablet (10 mg total) by mouth at bedtime.  Dispense: 30 tablet; Refill: 3 - levocetirizine (XYZAL) 5 MG tablet; Take 1 tablet (5 mg total) by mouth every evening.  Dispense: 30 tablet; Refill: 3 - promethazine-dextromethorphan (PROMETHAZINE-DM) 6.25-15 MG/5ML syrup; Take 2.5 mLs by mouth 2 (two) times daily.  Dispense: 118 mL; Refill: 0  3. Cough - promethazine-dextromethorphan (PROMETHAZINE-DM) 6.25-15 MG/5ML syrup; Take 2.5 mLs by mouth 2 (two) times daily.  Dispense: 118 mL; Refill: 0  4. GERD without esophagitis -discussed identifying triggers - omeprazole (PRILOSEC) 40 MG capsule; Take 1 capsule (40 mg total) by mouth daily.  Dispense: 90 capsule; Refill: 1  Has routine follow-up next month with PCP Face-to-face time with patient was more than 25 minutes, >50% time spent counseling and coordination of care -Red flags and when to present for emergency care or RTC including fever >101.50F, chest pain, shortness of breath, new/worsening/un-resolving symptoms, reviewed with patient at time of visit. Follow up and care instructions discussed and provided in AVS.  -------------------------------------------- I have reviewed this encounter including the documentation in this note and/or discussed this patient with the provider, Suezanne Cheshire DNP AGNP-C. I am certifying that I agree with the content of this note as supervising physician. Enid Derry, [facility] 04/12/2018, 5:56 PM

## 2018-04-16 ENCOUNTER — Encounter: Payer: Self-pay | Admitting: Obstetrics and Gynecology

## 2018-04-16 ENCOUNTER — Ambulatory Visit (INDEPENDENT_AMBULATORY_CARE_PROVIDER_SITE_OTHER): Payer: 59 | Admitting: Obstetrics and Gynecology

## 2018-04-16 VITALS — BP 107/71 | HR 87 | Ht 63.0 in | Wt 152.4 lb

## 2018-04-16 DIAGNOSIS — R238 Other skin changes: Secondary | ICD-10-CM | POA: Diagnosis not present

## 2018-04-16 DIAGNOSIS — N951 Menopausal and female climacteric states: Secondary | ICD-10-CM | POA: Diagnosis not present

## 2018-04-16 DIAGNOSIS — N952 Postmenopausal atrophic vaginitis: Secondary | ICD-10-CM | POA: Diagnosis not present

## 2018-04-16 DIAGNOSIS — W57XXXA Bitten or stung by nonvenomous insect and other nonvenomous arthropods, initial encounter: Secondary | ICD-10-CM | POA: Diagnosis not present

## 2018-04-16 NOTE — Addendum Note (Signed)
Addended by: Augusto Gamble on: 04/16/2018 10:56 AM   Modules accepted: Orders

## 2018-04-16 NOTE — Progress Notes (Signed)
Pt is present today for medication revaluation on medication given for menopause. Pt stated that she read about the side effect of the medications and didn't take the medication. Pt wants more options to deal with menopause symptoms.

## 2018-04-18 ENCOUNTER — Encounter: Payer: Self-pay | Admitting: Obstetrics and Gynecology

## 2018-04-18 NOTE — Progress Notes (Signed)
    GYNECOLOGY PROGRESS NOTE  Subjective:    Patient ID: Makayla Mason, female    DOB: 12/02/1965, 52 y.o.   MRN: [id number]  HPI  Patient is a 52 y.o. G58P2002 female who presents for 1 month f/u of HRT.  Patient was initiated on HRT (Prempro) last visit for her menopausal symptoms. She was also supposed to begin use of hydrocortisone cream to help treat her skin irritation in the vaginal and buttock regions Patient states that she never started the medications as when she looked at the side effects of the medication she got scared and notes that her mother "talked her out of taking them, instructing her that she should just deal with it".  Patient has no reasoning for not initiating the hydrocortisone cream.   The following portions of the patient's history were reviewed and updated as appropriate: allergies, current medications, past family history, past medical history, past social history, past surgical history and problem list.  Review of Systems A comprehensive review of systems was negative except for: Skin: patient reports possible bug bute with rash on her right shoulder, just noticed 2 days ago   Objective:   Blood pressure 107/71, pulse 87, height 5\' 3"  (1.6 m), weight 152 lb 6.4 oz (69.1 kg). General appearance: alert and no distress Skin: small macular rash along left shoulder, no evidence of any specific bite mark.  Remainder of exam deferred  Assessment:   Menopausal symptoms Vaginal atrophy Skin irritation Bug bite  Plan:   - Discussion had again with patient regarding concerns taking HRT, and if she would desire to change to a non-hormonal approach.  After discussion of concerns, patient notes that she will try the hormones after all. Will f/u in several weeks to reassess symptoms.  - Vaginal atrophy - should improve once patient begins on HRT.  - Skin irritation - strongly encouraged to use topical hydrocortisone cream as previously prescribed.   - Bug bite with  macular rash. Advised that she could also place hydrocortisone cream along area for treatment.    Rubie Maid, MD [facility]

## 2018-04-22 DIAGNOSIS — H5203 Hypermetropia, bilateral: Secondary | ICD-10-CM | POA: Diagnosis not present

## 2018-04-23 DIAGNOSIS — B029 Zoster without complications: Secondary | ICD-10-CM | POA: Diagnosis not present

## 2018-04-23 DIAGNOSIS — M79601 Pain in right arm: Secondary | ICD-10-CM | POA: Diagnosis not present

## 2018-05-01 DIAGNOSIS — Z7689 Persons encountering health services in other specified circumstances: Secondary | ICD-10-CM | POA: Diagnosis not present

## 2018-05-01 DIAGNOSIS — I208 Other forms of angina pectoris: Secondary | ICD-10-CM | POA: Diagnosis not present

## 2018-05-01 DIAGNOSIS — R011 Cardiac murmur, unspecified: Secondary | ICD-10-CM | POA: Diagnosis not present

## 2018-05-01 DIAGNOSIS — K219 Gastro-esophageal reflux disease without esophagitis: Secondary | ICD-10-CM | POA: Diagnosis not present

## 2018-05-06 ENCOUNTER — Other Ambulatory Visit: Payer: Self-pay | Admitting: Internal Medicine

## 2018-05-06 DIAGNOSIS — I208 Other forms of angina pectoris: Secondary | ICD-10-CM

## 2018-05-07 ENCOUNTER — Other Ambulatory Visit: Payer: Self-pay | Admitting: Internal Medicine

## 2018-05-07 DIAGNOSIS — R011 Cardiac murmur, unspecified: Secondary | ICD-10-CM

## 2018-05-10 ENCOUNTER — Encounter: Payer: Self-pay | Admitting: Nurse Practitioner

## 2018-05-10 ENCOUNTER — Ambulatory Visit (INDEPENDENT_AMBULATORY_CARE_PROVIDER_SITE_OTHER): Payer: 59 | Admitting: Nurse Practitioner

## 2018-05-10 VITALS — BP 110/70 | HR 100 | Temp 97.3°F | Resp 16 | Ht 63.0 in | Wt 152.6 lb

## 2018-05-10 DIAGNOSIS — J302 Other seasonal allergic rhinitis: Secondary | ICD-10-CM

## 2018-05-10 DIAGNOSIS — R21 Rash and other nonspecific skin eruption: Secondary | ICD-10-CM | POA: Diagnosis not present

## 2018-05-10 NOTE — Progress Notes (Addendum)
Name: Makayla Mason   MRN: [id number]    DOB: 08-15-1966   Date:05/10/2018       Progress Note  Subjective  Chief Complaint  Chief Complaint  Patient presents with  . Herpes Zoster    wants to ensure that shingles is healing correctly. She has shingles located on the right shoulder x 1 month. She was evaluted and diagnosed and treated @ UC. She was treated with Prednisone and Valtrex. She continues to have a tingling sensation in the area.  . Cough    she complains of dry cough x 1 week that comes and goes. Tried to treat cough with Promethazine with poor improvement.    HPI  Patient was dx with shingles on 6/18 at urgent care and started on valtrex and prednisone at that time. Has rash to left shoulder, painful, and tingling, states cant carry handbag, hurts to brush hair. Patient has been taking prednisone and valtrex and has finished the course. Patient endorses itching and painful. States doesn't recall, vesicles or draining feels like rash is getting bigger.  No new detergents, soaps, hasn't been out in the sun a lot, no ticks shes noticed.   Pt endorses chronic cough; thinks it is related to allergies. Never got call back from referral.   Patient Active Problem List   Diagnosis Date Noted  . Overweight (BMI 25.0-29.9) 01/29/2018  . Right hip pain 01/29/2018  . Insulin resistance 02/28/2017  . Low grade squamous intraepith lesion on cytologic smear cervix (lgsil) 02/28/2017  . GERD without esophagitis 03/15/2016  . Left ankle pain 03/15/2016  . Allergic rhinitis, seasonal 05/21/2015  . Scoliosis 05/21/2015  . 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 Allergic rhinitis, seasonal   . Mild scoliosis   . TMJ arthralgia    right side    Past Surgical History:  Procedure Laterality Date  . COLONOSCOPY WITH PROPOFOL N/A 02/15/2018   Procedure: COLONOSCOPY WITH PROPOFOL;  Surgeon: Jonathon Bellows, MD;  Location[facility] ENDOSCOPY;   Service: Gastroenterology;  Laterality: N/A;    Social History   Tobacco Use  . Smoking status: Never Smoker  . Smokeless tobacco: Never Used  Substance Use Topics  . Alcohol use: Yes    Alcohol/week: 0.0 oz    Comment: occasional     Current Outpatient Medications:  .  azelastine (OPTIVAR) 0.05 % ophthalmic solution, Place 1 drop into both eyes daily., Disp: , Rfl: 6 .  estrogen, conjugated,-medroxyprogesterone (PREMPRO) 0.625-2.5 MG tablet, Take 1 tablet by mouth daily., Disp: 30 tablet, Rfl: 11 .  hydrocortisone cream 0.5 %, Apply 1 application topically 2 (two) times daily., Disp: 30 g, Rfl: 0 .  levocetirizine (XYZAL) 5 MG tablet, Take 1 tablet (5 mg total) by mouth every evening., Disp: 30 tablet, Rfl: 3 .  montelukast (SINGULAIR) 10 MG tablet, Take 1 tablet (10 mg total) by mouth at bedtime., Disp: 30 tablet, Rfl: 3 .  omeprazole (PRILOSEC) 40 MG capsule, Take 1 capsule (40 mg total) by mouth daily., Disp: 90 capsule, Rfl: 1 .  promethazine-dextromethorphan (PROMETHAZINE-DM) 6.25-15 MG/5ML syrup, Take 2.5 mLs by mouth 2 (two) times daily., Disp: 118 mL, Rfl: 0 .  triamcinolone (NASACORT ALLERGY 24HR) 55 MCG/ACT AERO nasal inhaler, Place 2 sprays into the nose daily., Disp: 1 Inhaler, Rfl: 3 .  predniSONE (DELTASONE) 10 MG tablet, Take 10 mg by mouth 3 (three) times daily. Taper dose, Disp: , Rfl: 0 .  valACYclovir (VALTREX) 1000 MG  tablet, Take 1 g by mouth 3 (three) times daily., Disp: , Rfl: 0  Allergies  Allergen Reactions  . Codeine Nausea And Vomiting    ROS  Constitutional: Negative for fever or weight change.  Respiratory: Positive for cough; Negative for shortness of breath.   Cardiovascular: Negative for chest pain or palpitations.  Gastrointestinal: Negative for abdominal pain, no bowel changes.  Musculoskeletal: Negative for gait problem or joint swelling.  Skin: Positive for rash.  Neurological: Negative for dizziness or headache.  No other specific  complaints in a complete review of systems (except as listed in HPI above).  Objective  Vitals:   05/10/18 1420  BP: 110/70  Pulse: 100  Resp: 16  Temp: (!) 97.3 F (36.3 C)  TempSrc: Oral  SpO2: 97%  Weight: 152 lb 9.6 oz (69.2 kg)  Height: 5\' 3"  (1.6 m)    Body mass index is 27.03 kg/m.  Nursing Note and Vital Signs reviewed.  Physical Exam  Skin:        Constitutional: Patient appears well-developed and well-nourished. No distress.  Cardiovascular: Normal rate,  Pulmonary/Chest: Effort normal  Psychiatric: Patient has a normal mood and affect. behavior is normal. Judgment and thought content normal.  No results found for this or any previous visit (from the past 72 hour(s)).  Assessment & Plan  1.Raised rash -OTC antifungal, send to derm, - Ambulatory referral to Dermatology  2. Seasonal allergic rhinitis, unspecified trigger -chronic cough; continue anti-histamine and Singulair, avoid triggers.  - Ambulatory referral to Allergy    -Red flags and when to present for emergency care or RTC including fever >101.81F, chest pain, shortness of breath, new/worsening/un-resolving symptoms,  reviewed with patient at time of visit. Follow up and care instructions discussed and provided in AVS.  -------------------------------------------- Large nummular erythematous lesion on the posterior shoulder, perhaps 10-12 cm or more in diameter; very discrete raised border, with some centrally papular erythematous lesions which are also nummular, various sizes; no vesicles I have reviewed this encounter including the documentation in this note and/or discussed this patient with the provider, Suezanne Cheshire DNP AGNP-C. I am certifying that I agree with the content of this note as supervising physician. Enid Derry, [facility] 05/10/2018, 5:06 PM

## 2018-05-10 NOTE — Patient Instructions (Signed)
You can try: -  OTC anti-fungal cream per instructions.

## 2018-05-14 DIAGNOSIS — B354 Tinea corporis: Secondary | ICD-10-CM | POA: Diagnosis not present

## 2018-05-15 ENCOUNTER — Ambulatory Visit
Admission: RE | Admit: 2018-05-15 | Discharge: 2018-05-15 | Disposition: A | Discharge status: Home / Self Care | Payer: 59 | Source: Ambulatory Visit | Attending: Internal Medicine | Admitting: Internal Medicine

## 2018-05-15 DIAGNOSIS — R011 Cardiac murmur, unspecified: Secondary | ICD-10-CM

## 2018-05-15 DIAGNOSIS — I208 Other forms of angina pectoris: Secondary | ICD-10-CM | POA: Insufficient documentation

## 2018-05-15 LAB — NM MYOCAR MULTI W/SPECT W/WALL MOTION / EF
CHL CUP MPHR: 166 {beats}/min
CHL CUP NUCLEAR SDS: 0
Estimated workload: 4.6 METS
Exercise duration (min): 4 min
Exercise duration (sec): 0 s
LVDIAVOL: 30 mL (ref 46–106)
LVSYSVOL: 6 mL
NUC STRESS TID: 0.76
Peak HR: 146 {beats}/min
Percent HR: 87 %
Rest HR: 86 {beats}/min
SRS: 2
SSS: 1

## 2018-05-15 MED ORDER — TECHNETIUM TC 99M TETROFOSMIN IV KIT
13.5100 | PACK | Freq: Once | INTRAVENOUS | Status: AC | PRN
  Administered 2018-05-15: 13.51 via INTRAVENOUS

## 2018-05-15 MED ORDER — TECHNETIUM TC 99M TETROFOSMIN IV KIT
32.1000 | PACK | Freq: Once | INTRAVENOUS | Status: AC | PRN
  Administered 2018-05-15: 32.1 via INTRAVENOUS

## 2018-05-15 NOTE — Progress Notes (Signed)
*  PRELIMINARY RESULTS* Echocardiogram 2D Echocardiogram has been performed.  Sherrie Sport 05/15/2018, 12:02 PM

## 2018-05-23 ENCOUNTER — Other Ambulatory Visit: Payer: Self-pay | Admitting: Family Medicine

## 2018-05-23 ENCOUNTER — Telehealth: Payer: Self-pay

## 2018-05-23 DIAGNOSIS — J019 Acute sinusitis, unspecified: Secondary | ICD-10-CM | POA: Diagnosis not present

## 2018-05-23 DIAGNOSIS — J302 Other seasonal allergic rhinitis: Secondary | ICD-10-CM

## 2018-05-23 DIAGNOSIS — J309 Allergic rhinitis, unspecified: Secondary | ICD-10-CM | POA: Diagnosis not present

## 2018-05-23 NOTE — Telephone Encounter (Signed)
Copied from [place] [phone]. Topic: Referral - Request >> May 23, 2018 10:44 AM Pricilla Handler wrote: Reason for CRM: Patient is in need of a referral for Dr. Huey Romans, MD  [facility] right now. Patient is at the office now. Dr. Huey Romans has agreed to see her now, but the office wants the formal referral sent to his office now. Called [facility]. Talked with Miss Melissa. She asked to to send this CRM.        Thank You!!!  >> May 23, 2018  1:34 PM Nils Flack, [place] [facility] wrote: Sending to [place] at [place] so provider can enter referral into system.  I can process once it is in system.  Thank you

## 2018-06-03 ENCOUNTER — Encounter: Payer: Self-pay | Admitting: Family Medicine

## 2018-06-03 ENCOUNTER — Ambulatory Visit (INDEPENDENT_AMBULATORY_CARE_PROVIDER_SITE_OTHER): Payer: 59 | Admitting: Family Medicine

## 2018-06-03 VITALS — BP 116/64 | HR 100 | Temp 98.3°F | Resp 16 | Ht 63.0 in | Wt 151.6 lb

## 2018-06-03 DIAGNOSIS — J302 Other seasonal allergic rhinitis: Secondary | ICD-10-CM

## 2018-06-03 DIAGNOSIS — S0501XA Injury of conjunctiva and corneal abrasion without foreign body, right eye, initial encounter: Secondary | ICD-10-CM

## 2018-06-03 DIAGNOSIS — H10021 Other mucopurulent conjunctivitis, right eye: Secondary | ICD-10-CM | POA: Diagnosis not present

## 2018-06-03 DIAGNOSIS — J3089 Other allergic rhinitis: Secondary | ICD-10-CM | POA: Diagnosis not present

## 2018-06-03 DIAGNOSIS — R7303 Prediabetes: Secondary | ICD-10-CM

## 2018-06-03 DIAGNOSIS — R87612 Low grade squamous intraepithelial lesion on cytologic smear of cervix (LGSIL): Secondary | ICD-10-CM

## 2018-06-03 DIAGNOSIS — K219 Gastro-esophageal reflux disease without esophagitis: Secondary | ICD-10-CM

## 2018-06-03 DIAGNOSIS — M7551 Bursitis of right shoulder: Secondary | ICD-10-CM | POA: Diagnosis not present

## 2018-06-03 LAB — POCT GLYCOSYLATED HEMOGLOBIN (HGB A1C): HBA1C, POC (PREDIABETIC RANGE): 5.9 % (ref 5.7–6.4)

## 2018-06-03 MED ORDER — LIDOCAINE HCL (PF) 1 % IJ SOLN
2.0000 mL | Freq: Once | INTRAMUSCULAR | Status: AC
  Administered 2018-06-03: 2 mL

## 2018-06-03 MED ORDER — CIPROFLOXACIN HCL 0.3 % OP SOLN: 2.0000 [drp] | OPHTHALMIC | 0 refills | Status: DC

## 2018-06-03 MED ORDER — TRIAMCINOLONE ACETONIDE 40 MG/ML IJ SUSP
40.0000 mg | Freq: Once | INTRAMUSCULAR | Status: AC
  Administered 2018-06-03: 40 mg via INTRAMUSCULAR

## 2018-06-03 NOTE — Progress Notes (Signed)
Name: Makayla Mason   MRN: [id number]    DOB: 1966/04/12   Date:06/03/2018       Progress Note  Subjective  Chief Complaint  Chief Complaint  Patient presents with  . Medication Refill  . Conjunctivitis    Onset-last night, right eye-swollen and red  . Allergic Rhinitis     Going to see Dr. Kathyrn Sheriff tomorrow for allergy test  . Gastroesophageal Reflux    Well controlled   . Insulin Resistance    Not taking any medications just eating healthier    HPI  Conjunctivitis: she noticed some pruritis on right eye last night, this morning she woke up with right eye matted shut, no pain or blurred vision. Today it feels gritty in her right eye. She always has nasal congestion and will see allergist tomorrow. Mild post-nasal drainage, no fever or chills. She cares for her 46 yo grandson but he is not sick.   Insulin Resistance: She denies polyphagia, polyuria or polyphagia. Discussed life style modification also.HgbA1C 6.0 went down to 5.3% , she is off medication, could not tolerate metformin.   GERD: she takes Omeprazole daily, and symptoms are controlled, no heartburn or regurgitation, avoiding fried food and spicy food. Discussed long term use of PPI, she is now taking it prn, she has not switched to ranitidine yet.   Perennial AR: constant dry cough, post-nasal drainage, nasal congestion. She will have allergy test tomorrow and cannot take it today. More congested than usual because of it.   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 Marcelline Mates and is under her care now.   Bursitis : she noticed pain with abduction of right shoulder, no weakness or tingling down her arm, no neck pain. No redness  Rash shoulder: seen by dermatologist given a cream for tinea and symptoms are resolving.   Patient Active Problem List   Diagnosis Date Noted  . Overweight (BMI 25.0-29.9) 01/29/2018  . Right hip pain 01/29/2018  . Insulin resistance 02/28/2017  . Low grade squamous intraepith lesion  on cytologic smear cervix (lgsil) 02/28/2017  . GERD without esophagitis 03/15/2016  . Left ankle pain 03/15/2016  . Allergic rhinitis, seasonal 05/21/2015  . Scoliosis 05/21/2015  . 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 COLONOSCOPY WITH PROPOFOL N/A 02/15/2018   Procedure: COLONOSCOPY WITH PROPOFOL;  Surgeon: Jonathon Bellows, MD;  Location: [facility] ENDOSCOPY;  Service: Gastroenterology;  Laterality: N/A;    Family History  Problem Relation Age of Onset  . Breast cancer Cousin   . Diabetes Mother   . Diabetes Father   . Hypertension Father   . Asthma Daughter   . Anemia Maternal Aunt   . Stroke Maternal Aunt   . Heart disease Maternal Grandmother   . Kidney disease Maternal Grandmother   . Hypertension Maternal Grandmother     Social History   Socioeconomic History  . Marital status: Divorced    Spouse name: Not on file  . Number of children: Not on file  . Years of education: Not on file  . Highest education level: Not on file  Occupational History  . Not on file  Social Needs  . Financial resource strain: Not on file  . Food insecurity:    Worry: Not on file    Inability: Not on file  . Transportation needs:    Medical: Not on file    Non-medical: Not on file  Tobacco Use  .  Smoking status: Never Smoker  . Smokeless tobacco: Never Used  Substance and Sexual Activity  . Alcohol use: Yes    Alcohol/week: 0.0 oz    Comment: occasional  . Drug use: No  . Sexual activity: Not Currently    Birth control/protection: Post-menopausal  Lifestyle  . Physical activity:    Days per week: Not on file    Minutes per session: Not on file  . Stress: Not on file  Relationships  . Social connections:    Talks on phone: Not on file    Gets together: Not on file    Attends religious service: Not on file    Active member of club or organization: Not on file    Attends meetings of clubs or organizations: Not on  file    Relationship status: Not on file  . Intimate partner violence:    Fear of current or ex partner: Not on file    Emotionally abused: Not on file    Physically abused: Not on file    Forced sexual activity: Not on file  Other Topics Concern  . Not on file  Social History Narrative  . Not on file     Current Outpatient Medications:  .  ketoconazole (NIZORAL) 2 % cream, , Disp: , Rfl: 0 .  levocetirizine (XYZAL) 5 MG tablet, Take 1 tablet (5 mg total) by mouth every evening., Disp: 30 tablet, Rfl: 3 .  montelukast (SINGULAIR) 10 MG tablet, Take 1 tablet (10 mg total) by mouth at bedtime., Disp: 30 tablet, Rfl: 3 .  omeprazole (PRILOSEC) 40 MG capsule, Take 1 capsule (40 mg total) by mouth daily., Disp: 90 capsule, Rfl: 1 .  triamcinolone (NASACORT ALLERGY 24HR) 55 MCG/ACT AERO nasal inhaler, Place 2 sprays into the nose daily., Disp: 1 Inhaler, Rfl: 3 .  azelastine (OPTIVAR) 0.05 % ophthalmic solution, Place 1 drop into both eyes daily., Disp: , Rfl: 6 .  estrogen, conjugated,-medroxyprogesterone (PREMPRO) 0.625-2.5 MG tablet, Take 1 tablet by mouth daily. (Patient not taking: Reported on 06/03/2018), Disp: 30 tablet, Rfl: 11 .  hydrocortisone cream 0.5 %, Apply 1 application topically 2 (two) times daily. (Patient not taking: Reported on 06/03/2018), Disp: 30 g, Rfl: 0 .  predniSONE (DELTASONE) 10 MG tablet, Take 10 mg by mouth 3 (three) times daily. Taper dose, Disp: , Rfl: 0 .  promethazine-dextromethorphan (PROMETHAZINE-DM) 6.25-15 MG/5ML syrup, Take 2.5 mLs by mouth 2 (two) times daily. (Patient not taking: Reported on 06/03/2018), Disp: 118 mL, Rfl: 0 .  valACYclovir (VALTREX) 1000 MG tablet, Take 1 g by mouth 3 (three) times daily., Disp: , Rfl: 0  Allergies  Allergen Reactions  . Codeine Nausea And Vomiting     ROS  Constitutional: Negative for fever or weight change.  Respiratory: Positive  for cough but no  shortness of breath.   Cardiovascular: Negative for chest pain  or palpitations.  Gastrointestinal: Negative for abdominal pain, no bowel changes.  Musculoskeletal: Negative for gait problem or joint swelling.  Skin: positive  for resolving rash.  Neurological: Negative for dizziness or headache.  No other specific complaints in a complete review of systems (except as listed in HPI above).  Objective  Vitals:   06/03/18 1021  BP: 116/64  Pulse: 100  Resp: 16  Temp: 98.3 F (36.8 C)  TempSrc: Oral  SpO2: 97%  Weight: 151 lb 9.6 oz (68.8 kg)  Height: 5\' 3"  (1.6 m)    Body mass index is 26.85 kg/m.  Physical Exam  Constitutional: Patient appears well-developed and well-nourished. Overweight  No distress.  HEENT: head atraumatic, normocephalic, pupils equal and reactive to light, injected right conjunctiva - no pus at this time, but it was before she arrived,  neck supple, throat within normal limits Cardiovascular: Normal rate, regular rhythm and normal heart sounds.  No murmur heard. No BLE edema. Pulmonary/Chest: Effort normal and breath sounds normal. No respiratory distress. Abdominal: Soft.  There is no tenderness. Muscular Skeletal: pain during palpation of deltoid bursa, pain with abduction and internal rotation of right shoulder  Psychiatric: Patient has a normal mood and affect. behavior is normal. Judgment and thought content normal.  Recent Results (from the past 2160 hour(s))  NM Myocar Multi W/Spect W/Wall Motion / EF     Status: None   Collection Time: 05/15/18 12:27 PM  Result Value Ref Range   Rest HR 86 bpm   Rest BP 129/81 mmHg   Exercise duration (sec) 0 sec   Percent HR 87 %   Exercise duration (min) 4 min   Estimated workload 4.6 METS   Peak HR 146 bpm   Peak BP 165/80 mmHg   MPHR 166 bpm   SSS 1    SRS 2    SDS 0    TID 0.76    LV sys vol 6 mL   LV dias vol 30 46 - 106 mL      PHQ2/9: Depression screen [facility] 2/9 06/03/2018 12/03/2017 04/04/2017 01/25/2017 03/15/2016  Decreased Interest 0 0 0 0 0  Down,  Depressed, Hopeless 0 0 0 0 0  PHQ - 2 Score 0 0 0 0 0     Fall Risk: Fall Risk  06/03/2018 05/10/2018 04/12/2018 12/26/2017 12/03/2017  Falls in the past year? No No No No Yes  Number falls in past yr: - - - - 1  Injury with Fall? - - - - Yes  Comment - - - - Right Leg     Assessment & Plan  1. Prediabetes  - POCT HgB A1C  2. GERD without esophagitis  Discussed again the need to stop PPI   3. Perennial allergic rhinitis with seasonal variation  Seeing ENT and she will have allergy testing tomorrow  4. Low grade squamous intraepith lesion on cytologic smear cervix (lgsil)  Keep follow up with Dr. Marcelline Mates   5. Acute purulent conjunctivitis, right  - Visual acuity screening - ciprofloxacin (CILOXAN) 0.3 % ophthalmic solution; Place 2 drops into both eyes every 2 (two) hours. every 2 hoursfor 2 days. Then 1 drop, every 4 hours  Dispense: 5 mL; Refill: 0  6. Bursitis of right shoulder  Consent form signed Localized bursa - right deltoid bursa Area prepped with alcohol  Injection with lidocaine 1% and Kenalog 40mg /1 ml on bursa sack Patient tolerated procedure well No side effects

## 2018-06-03 NOTE — Patient Instructions (Signed)
Bursitis Bursitis is inflammation and irritation of a bursa, which is one of the small, fluid-filled sacs that cushion and protect the moving parts of your body. These sacs are located between bones and muscles, muscle attachments, or skin areas next to bones. A bursa protects these structures from the wear and tear that results from frequent movement. An inflamed bursa causes pain and swelling. Fluid may build up inside the sac. Bursitis is most common near joints, especially the knees, elbows, hips, and shoulders. What are the causes? Bursitis can be caused by:  Injury from: ? A direct blow, like falling on your knee or elbow. ? Overuse of a joint (repetitive stress).  Infection. This can happen if bacteria gets into a bursa through a cut or scrape near a joint.  Diseases that cause joint inflammation, such as gout and rheumatoid arthritis.  What increases the risk? You may be at risk for bursitis if you:  Have a job or hobby that involves a lot of repetitive stress on your joints.  Have a condition that weakens your body's defense system (immune system), such as diabetes, cancer, or HIV.  Lift and reach overhead often.  Kneel or lean on hard surfaces often.  Run or walk often.  What are the signs or symptoms? The most common signs and symptoms of bursitis are:  Pain that gets worse when you move the affected body part or put weight on it.  Inflammation.  Stiffness.  Other signs and symptoms may include:  Redness.  Tenderness.  Warmth.  Pain that continues after rest.  Fever and chills. This may occur in bursitis caused by infection.  How is this diagnosed? Bursitis may be diagnosed by:  Medical history and physical exam.  MRI.  A procedure to drain fluid from the bursa with a needle (aspiration). The fluid may be checked for signs of infection or gout.  Blood tests to rule out other causes of inflammation.  How is this treated? Bursitis can usually be  treated at home with rest, ice, compression, and elevation (RICE). For mild bursitis, RICE treatment may be all you need. Other treatments may include:  Nonsteroidal anti-inflammatory drugs (NSAIDs) to treat pain and inflammation.  Corticosteroids to fight inflammation. You may have these drugs injected into and around the area of bursitis.  Aspiration of bursitis fluid to relieve pain and improve movement.  Antibiotic medicine to treat an infected bursa.  A splint, brace, or walking aid.  Physical therapy if you continue to have pain or limited movement.  Surgery to remove a damaged or infected bursa. This may be needed if you have a very bad case of bursitis or if other treatments have not worked.  Follow these instructions at home:  Take medicines only as directed by your health care provider.  If you were prescribed an antibiotic medicine, finish it all even if you start to feel better.  Rest the affected area as directed by your health care provider. ? Keep the area elevated. ? Avoid activities that make pain worse.  Apply ice to the injured area: ? Place ice in a plastic bag. ? Place a towel between your skin and the bag. ? Leave the ice on for 20 minutes, 2-3 times a day.  Use splints, braces, pads, or walking aids as directed by your health care provider.  Keep all follow-up visits as directed by your health care provider. This is important. How is this prevented?  Wear knee pads if you kneel often.    Wear sturdy running or walking shoes that fit you well.  Take regular breaks from repetitive activity.  Warm up by stretching before doing any strenuous activity.  Maintain a healthy weight or lose weight as recommended by your health care provider. Ask your health care provider if you need help.  Exercise regularly. Start any new physical activity gradually. Contact a health care provider if:  Your bursitis is not responding to treatment or home care.  You have  a fever.  You have chills. This information is not intended to replace advice given to you by your health care provider. Make sure you discuss any questions you have with your health care provider. Document Released: 10/20/2000 Document Revised: 03/30/2016 Document Reviewed: 01/12/2014 Elsevier Interactive Patient Education  2018 Elsevier Inc.  

## 2018-06-04 DIAGNOSIS — J301 Allergic rhinitis due to pollen: Secondary | ICD-10-CM | POA: Diagnosis not present

## 2018-06-04 DIAGNOSIS — J329 Chronic sinusitis, unspecified: Secondary | ICD-10-CM | POA: Diagnosis not present

## 2018-06-07 DIAGNOSIS — J301 Allergic rhinitis due to pollen: Secondary | ICD-10-CM | POA: Diagnosis not present

## 2018-06-10 DIAGNOSIS — J301 Allergic rhinitis due to pollen: Secondary | ICD-10-CM | POA: Diagnosis not present

## 2018-06-13 DIAGNOSIS — J301 Allergic rhinitis due to pollen: Secondary | ICD-10-CM | POA: Diagnosis not present

## 2018-06-17 DIAGNOSIS — J301 Allergic rhinitis due to pollen: Secondary | ICD-10-CM | POA: Diagnosis not present

## 2018-06-19 DIAGNOSIS — J301 Allergic rhinitis due to pollen: Secondary | ICD-10-CM | POA: Diagnosis not present

## 2018-06-20 ENCOUNTER — Other Ambulatory Visit: Payer: Self-pay | Admitting: Family Medicine

## 2018-06-20 DIAGNOSIS — J301 Allergic rhinitis due to pollen: Secondary | ICD-10-CM | POA: Diagnosis not present

## 2018-06-20 DIAGNOSIS — J302 Other seasonal allergic rhinitis: Secondary | ICD-10-CM

## 2018-06-21 DIAGNOSIS — J301 Allergic rhinitis due to pollen: Secondary | ICD-10-CM | POA: Diagnosis not present

## 2018-06-21 DIAGNOSIS — J329 Chronic sinusitis, unspecified: Secondary | ICD-10-CM | POA: Diagnosis not present

## 2018-06-24 DIAGNOSIS — J301 Allergic rhinitis due to pollen: Secondary | ICD-10-CM | POA: Diagnosis not present

## 2018-06-27 DIAGNOSIS — J301 Allergic rhinitis due to pollen: Secondary | ICD-10-CM | POA: Diagnosis not present

## 2018-07-01 DIAGNOSIS — J301 Allergic rhinitis due to pollen: Secondary | ICD-10-CM | POA: Diagnosis not present

## 2018-07-04 DIAGNOSIS — J301 Allergic rhinitis due to pollen: Secondary | ICD-10-CM | POA: Diagnosis not present

## 2018-07-10 NOTE — Telephone Encounter (Signed)
error 

## 2018-07-11 ENCOUNTER — Encounter: Payer: Medicaid Other | Admitting: Obstetrics and Gynecology

## 2018-07-11 ENCOUNTER — Encounter: Payer: Self-pay | Admitting: Obstetrics and Gynecology

## 2018-07-11 ENCOUNTER — Ambulatory Visit (INDEPENDENT_AMBULATORY_CARE_PROVIDER_SITE_OTHER): Payer: 59 | Admitting: Obstetrics and Gynecology

## 2018-07-11 VITALS — BP 118/78 | HR 90 | Ht 63.0 in | Wt 152.0 lb

## 2018-07-11 DIAGNOSIS — N952 Postmenopausal atrophic vaginitis: Secondary | ICD-10-CM

## 2018-07-11 DIAGNOSIS — N951 Menopausal and female climacteric states: Secondary | ICD-10-CM | POA: Diagnosis not present

## 2018-07-11 DIAGNOSIS — J301 Allergic rhinitis due to pollen: Secondary | ICD-10-CM | POA: Diagnosis not present

## 2018-07-11 MED ORDER — PAROXETINE HCL 10 MG PO TABS: 10.0000 mg | ORAL_TABLET | Freq: Every day | ORAL | 3 refills | Status: DC

## 2018-07-11 NOTE — Progress Notes (Signed)
    GYNECOLOGY PROGRESS NOTE  Subjective:    Patient ID: Makayla Mason, female    DOB: 1965/11/28, 52 y.o.   MRN: [id number]  HPI  Patient is a 52 y.o. G70P2002 female who presents for follow up of menopausal vasomotor symptoms and vaginal atrophy. Was prescribed HRT last visit after further discussion of risks/benefits, however patient still notes that she felt unsettled about starting the medication and did not continue after samples ran out.    The following portions of the patient's history were reviewed and updated as appropriate: allergies, current medications, past family history, past medical history, past social history, past surgical history and problem list.  Review of Systems Pertinent items noted in HPI and remainder of comprehensive ROS otherwise negative.   Objective:   Blood pressure 118/78, pulse 90, height 5\' 3"  (1.6 m), weight 152 lb (68.9 kg). General appearance: alert and no distress Remainder of exam deferred.    Assessment:   Menopausal vasomotor symptoms Vaginal atrophy  Plan:   - Discussed options for non-hormonal options for management of vasomotor symptoms as patient still very reluctant to try hormonal therapy.  Discussed use of medical options such as Paxil, Effexor, Brisdelle, Clonidine,  or Neurontin.   Also discussed alternative therapies such as herbal remedies but cautioned that most of the products contained phytoestrogens (plant estrogens) in unregulated amounts which can have the same effects on the body as the pharmaceutical estrogen preparations.  Patient notes she has tried herbal remedies in the past with no real improvement.  Prescribed Paxil for symptoms.  - Vaginal atrophy, was previously using vaginal cream therapy but discontinued due to fear of use of hormonal affects of estrogen. Discussed use of Intrarosa (testosterone) tablets instead, or can use methods of natural moisturizers (Rephresh/Replens), or coconut/mineral oil daily.  Patient  willing to try Intrarosa.  Given sample today (2 week supply).  To inform if medication works and prescription desired. Advised that she may notice some improvement after the first 2 weeks, but maximum results usually noted after 6-12 weeks. Patient notes understanding.  - To f/u in 1 month for reassessment of symptoms. Also will need repeat pap smear with annual exam in 7 months. To schedule.   A total of 15 minutes were spent face-to-face with the patient during this encounter and over half of that time dealt with counseling and coordination of care.    Rubie Maid, MD [facility]

## 2018-07-11 NOTE — Patient Instructions (Signed)
Menopause Menopause is the normal time of life when menstrual periods stop completely. Menopause is complete when you have missed 12 consecutive menstrual periods. It usually occurs between the ages of 54 years and 55 years. Very rarely does a woman develop menopause before the age of 54 years. At menopause, your ovaries stop producing the female hormones estrogen and progesterone. This can cause undesirable symptoms and also affect your health. Sometimes the symptoms may occur 4-5 years before the menopause begins. There is no relationship between menopause and:  Oral contraceptives.  Number of children you had.  Race.  The age your menstrual periods started (menarche).  Heavy smokers and very thin women may develop menopause earlier in life. What are the causes?  The ovaries stop producing the female hormones estrogen and progesterone. Other causes include:  Surgery to remove both ovaries.  The ovaries stop functioning for no known reason.  Tumors of the pituitary gland in the brain.  Medical disease that affects the ovaries and hormone production.  Radiation treatment to the abdomen or pelvis.  Chemotherapy that affects the ovaries.  What are the signs or symptoms?  Hot flashes.  Night sweats.  Decrease in sex drive.  Vaginal dryness and thinning of the vagina causing painful intercourse.  Dryness of the skin and developing wrinkles.  Headaches.  Tiredness.  Irritability.  Memory problems.  Weight gain.  Bladder infections.  Hair growth of the face and chest.  Infertility. More serious symptoms include:  Loss of bone (osteoporosis) causing breaks (fractures).  Depression.  Hardening and narrowing of the arteries (atherosclerosis) causing heart attacks and strokes.  How is this diagnosed?  When the menstrual periods have stopped for 12 straight months.  Physical exam.  Hormone studies of the blood. How is this treated? There are many treatment  choices and nearly as many questions about them. The decisions to treat or not to treat menopausal changes is an individual choice made with your health care provider. Your health care provider can discuss the treatments with you. Together, you can decide which treatment will work best for you. Your treatment choices may include:  Hormone therapy (estrogen and progesterone).  Non-hormonal medicines.  Treating the individual symptoms with medicine (for example antidepressants for depression).  Herbal medicines that may help specific symptoms.  Counseling by a psychiatrist or psychologist.  Group therapy.  Lifestyle changes including: ? Eating healthy. ? Regular exercise. ? Limiting caffeine and alcohol. ? Stress management and meditation.  No treatment.  Follow these instructions at home:  Take the medicine your health care provider gives you as directed.  Get plenty of sleep and rest.  Exercise regularly.  Eat a diet that contains calcium (good for the bones) and soy products (acts like estrogen hormone).  Avoid alcoholic beverages.  Do not smoke.  If you have hot flashes, dress in layers.  Take supplements, calcium, and vitamin D to strengthen bones.  You can use over-the-counter lubricants or moisturizers for vaginal dryness.  Group therapy is sometimes very helpful.  Acupuncture may be helpful in some cases. Contact a health care provider if:  You are not sure you are in menopause.  You are having menopausal symptoms and need advice and treatment.  You are still having menstrual periods after age 54 years.  You have pain with intercourse.  Menopause is complete (no menstrual period for 12 months) and you develop vaginal bleeding.  You need a referral to a specialist (gynecologist, psychiatrist, or psychologist) for treatment. Get help right   away if:  You have severe depression.  You have excessive vaginal bleeding.  You fell and think you have a  broken bone.  You have pain when you urinate.  You develop leg or chest pain.  You have a fast pounding heart beat (palpitations).  You have severe headaches.  You develop vision problems.  You feel a lump in your breast.  You have abdominal pain or severe indigestion. This information is not intended to replace advice given to you by your health care provider. Make sure you discuss any questions you have with your health care provider. Document Released: 01/13/2004 Document Revised: 03/30/2016 Document Reviewed: 05/22/2013 Elsevier Interactive Patient Education  2017 Elsevier Inc.  

## 2018-07-11 NOTE — Progress Notes (Signed)
PT is present today for a follow to reassess HRT. Pt stated that she is not taking the medication prescribed for HRT. Pt stated that she read about the medication and the possible side effects. Pt stated that she will just deal with the symptoms of menopause.. Pt stated that she is doing well no complaints.

## 2018-07-15 ENCOUNTER — Ambulatory Visit (INDEPENDENT_AMBULATORY_CARE_PROVIDER_SITE_OTHER): Payer: 59 | Admitting: Family Medicine

## 2018-07-15 ENCOUNTER — Encounter: Payer: Self-pay | Admitting: Family Medicine

## 2018-07-15 VITALS — BP 124/82 | HR 76 | Temp 98.3°F | Resp 16 | Ht 63.0 in | Wt 151.6 lb

## 2018-07-15 DIAGNOSIS — N952 Postmenopausal atrophic vaginitis: Secondary | ICD-10-CM | POA: Diagnosis not present

## 2018-07-15 DIAGNOSIS — B354 Tinea corporis: Secondary | ICD-10-CM

## 2018-07-15 DIAGNOSIS — R7303 Prediabetes: Secondary | ICD-10-CM | POA: Diagnosis not present

## 2018-07-15 DIAGNOSIS — M5412 Radiculopathy, cervical region: Secondary | ICD-10-CM

## 2018-07-15 DIAGNOSIS — J301 Allergic rhinitis due to pollen: Secondary | ICD-10-CM | POA: Diagnosis not present

## 2018-07-15 MED ORDER — PREDNISONE 10 MG (48) PO TBPK: ORAL_TABLET | ORAL | 0 refills | Status: DC

## 2018-07-15 MED ORDER — PRASTERONE 6.5 MG VA INST: 1.0000 | VAGINAL_INSERT | Freq: Every day | VAGINAL | 0 refills | Status: DC

## 2018-07-15 NOTE — Patient Instructions (Signed)
Cervical Radiculopathy Cervical radiculopathy happens when a nerve in the neck (cervical nerve) is pinched or bruised. This condition can develop because of an injury or as part of the normal aging process. Pressure on the cervical nerves can cause pain or numbness that runs from the neck all the way down into the arm and fingers. Usually, this condition gets better with rest. Treatment may be needed if the condition does not improve. What are the causes? This condition may be caused by:  Injury.  Slipped (herniated) disk.  Muscle tightness in the neck because of overuse.  Arthritis.  Breakdown or degeneration in the bones and joints of the spine (spondylosis) due to aging.  Bone spurs that may develop near the cervical nerves.  What are the signs or symptoms? Symptoms of this condition include:  Pain that runs from the neck to the arm and hand. The pain can be severe or irritating. It may be worse when the neck is moved.  Numbness or weakness in the affected arm and hand.  How is this diagnosed? This condition may be diagnosed based on symptoms, medical history, and a physical exam. You may also have tests, including:  X-rays.  CT scan.  MRI.  Electromyogram (EMG).  Nerve conduction tests.  How is this treated? In many cases, treatment is not needed for this condition. With rest, the condition usually gets better over time. If treatment is needed, options may include:  Wearing a soft neck collar for short periods of time.  Physical therapy to strengthen your neck muscles.  Medicines, such as NSAIDs, oral corticosteroids, or spinal injections.  Surgery. This may be needed if other treatments do not help. Various types of surgery may be done depending on the cause of your problems.  Follow these instructions at home: Managing pain  Take over-the-counter and prescription medicines only as told by your health care provider.  If directed, apply ice to the affected  area. ? Put ice in a plastic bag. ? Place a towel between your skin and the bag. ? Leave the ice on for 20 minutes, 2-3 times per day.  If ice does not help, you can try using heat. Take a warm shower or warm bath, or use a heat pack as told by your health care provider.  Try a gentle neck and shoulder massage to help relieve symptoms. Activity  Rest as needed. Follow instructions from your health care provider about any restrictions on activities.  Do stretching and strengthening exercises as told by your health care provider or physical therapist. General instructions  If you were given a soft collar, wear it as told by your health care provider.  Use a flat pillow when you sleep.  Keep all follow-up visits as told by your health care provider. This is important. Contact a health care provider if:  Your condition does not improve with treatment. Get help right away if:  Your pain gets much worse and cannot be controlled with medicines.  You have weakness or numbness in your hand, arm, face, or leg.  You have a high fever.  You have a stiff, rigid neck.  You lose control of your bowels or your bladder (have incontinence).  You have trouble with walking, balance, or speaking. This information is not intended to replace advice given to you by your health care provider. Make sure you discuss any questions you have with your health care provider. Document Released: 07/18/2001 Document Revised: 03/30/2016 Document Reviewed: 12/17/2014 Elsevier Interactive Patient Education    2018 Elsevier Inc.  

## 2018-07-15 NOTE — Progress Notes (Addendum)
Name: Makayla Mason   MRN: [id number]    DOB: [birth year]   Date:07/15/2018       Progress Note  Subjective  Chief Complaint  Chief Complaint  Patient presents with  . Tinea    Right Shoulder-since August seen Dr. Sanda Klein and Benjamine Mola was referred to Dermatology and told it was Ringworm-still having burning sensation.     HPI  Paresthesia of right shoulder: she states she noticed numbness and tingling on right upper shoulder and back about 2-3 months ago, at times has some tingling on right arm. She states feels worse when taking a shower, denies weakness or neck pain. She was treated for tinea a couple of months ago and is concerned that is the rash that is causing the symptoms, but explained to her it is unrelated. Denies balance problems, no trauma or headache.   Pre-diabetes: last hgbA1C has gone up from 5.6% to 5.9%, she has nocturia, but denies polyphagia or polydipsia. Discussed healthier diet. Explained that prednisone will make glucose go up and has to be very compliant with diet  Vaginal atrophy: seeing Dr. Marcelline Mates, started new topical medication and denies any vaginal dryness at this time.   Patient Active Problem List   Diagnosis Date Noted  . Cervical radicular pain 07/15/2018  . Overweight (BMI 25.0-29.9) 01/29/2018  . Right hip pain 01/29/2018  . Insulin resistance 02/28/2017  . Low grade squamous intraepith lesion on cytologic smear cervix (lgsil) 02/28/2017  . GERD without esophagitis 03/15/2016  . Left ankle pain 03/15/2016  . Allergic rhinitis, seasonal 05/21/2015  . Scoliosis 05/21/2015  . 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 COLONOSCOPY WITH PROPOFOL N/A 02/15/2018   Procedure: COLONOSCOPY WITH PROPOFOL;  Surgeon: Jonathon Bellows, MD;  Location: [facility] ENDOSCOPY;  Service: Gastroenterology;  Laterality: N/A;    Family History  Problem Relation Age of Onset  . Breast cancer Cousin   . Diabetes  Mother   . Diabetes Father   . Hypertension Father   . Asthma Daughter   . Anemia Maternal Aunt   . Stroke Maternal Aunt   . Heart disease Maternal Grandmother   . Kidney disease Maternal Grandmother   . Hypertension Maternal Grandmother     Social History   Socioeconomic History  . Marital status: Divorced    Spouse name: Not on file  . Number of children: 2  . Years of education: Not on file  . Highest education level: Bachelor's degree (e.g., BA, AB, BS)  Occupational History  . Not on file  Social Needs  . Financial resource strain: Not hard at all  . Food insecurity:    Worry: Never true    Inability: Never true  . Transportation needs:    Medical: No    Non-medical: No  Tobacco Use  . Smoking status: Never Smoker  . Smokeless tobacco: Never Used  Substance and Sexual Activity  . Alcohol use: Yes    Alcohol/week: 0.0 standard drinks    Comment: occasional  . Drug use: No  . Sexual activity: Not Currently    Birth control/protection: Post-menopausal  Lifestyle  . Physical activity:    Days per week: Not on file    Minutes per session: Not on file  . Stress: Not at all  Relationships  . Social connections:    Talks on phone: Not on file    Gets together: Not on file    Attends religious service: Not on file  Active member of club or organization: Not on file    Attends meetings of clubs or organizations: Not on file    Relationship status: Not on file  . Intimate partner violence:    Fear of current or ex partner: No    Emotionally abused: No    Physically abused: No    Forced sexual activity: No  Other Topics Concern  . Not on file  Social History Narrative   Lives in [place], grown daughters and grandson are at home     Current Outpatient Medications:  .  AUVI-Q 0.3 MG/0.3ML SOAJ injection, INJECT AS NEEDED FOR SEVERE ALLERGIC REACTION INCLUDING ANAPHYLAXIS AS DIRECTED, Disp: , Rfl: 1 .  levocetirizine (XYZAL) 5 MG tablet, Take 1 tablet (5  mg total) by mouth every evening., Disp: 30 tablet, Rfl: 3 .  montelukast (SINGULAIR) 10 MG tablet, Take 1 tablet (10 mg total) by mouth at bedtime., Disp: 30 tablet, Rfl: 3 .  NASACORT ALLERGY 24HR 55 MCG/ACT AERO nasal inhaler, PLACE 2 SPRAYS INTO THE NOSE DAILY., Disp: 16.9 mL, Rfl: 3 .  omeprazole (PRILOSEC) 40 MG capsule, Take 1 capsule (40 mg total) by mouth daily., Disp: 90 capsule, Rfl: 1 .  PARoxetine (PAXIL) 10 MG tablet, Take 1 tablet (10 mg total) by mouth daily., Disp: 30 tablet, Rfl: 3 .  Prasterone (INTRAROSA) 6.5 MG INST, Place 1 tablet vaginally at bedtime. Dr. Marcelline Mates, Disp: 30 each, Rfl: 0 .  predniSONE (STERAPRED UNI-PAK 48 TAB) 10 MG (48) TBPK tablet, Take as directed with food and not before bedtime, Disp: 48 tablet, Rfl: 0  Allergies  Allergen Reactions  . Codeine Nausea And Vomiting     ROS  Constitutional: Negative for fever or weight change.  Respiratory: Negative for cough and shortness of breath.   Cardiovascular: Negative for chest pain or palpitations.  Gastrointestinal: Negative for abdominal pain, no bowel changes.  Musculoskeletal: Negative for gait problem or joint swelling.  Skin: Negative for rash.  Neurological: Negative for dizziness or headache.  No other specific complaints in a complete review of systems (except as listed in HPI above).  Objective  Vitals:   07/15/18 1112  BP: 124/82  Pulse: 76  Resp: 16  Temp: 98.3 F (36.8 C)  TempSrc: Oral  SpO2: 96%  Weight: 151 lb 9.6 oz (68.8 kg)  Height: 5\' 3"  (1.6 m)    Body mass index is 26.85 kg/m.  Physical Exam  Constitutional: Patient appears well-developed and well-nourished.  No distress.  HEENT: head atraumatic, normocephalic, pupils equal and reactive to light,  neck supple, throat within normal limits Cardiovascular: Normal rate, regular rhythm and normal heart sounds.  No murmur heard. No BLE edema. Pulmonary/Chest: Effort normal and breath sounds normal. No respiratory  distress. Abdominal: Soft.  There is no tenderness. Skin: hypopigmentation on the area of tinea, infection not present Muscular skeletal: numbness on upper back, worse when extending neck and rotated to the right side normal grip, normal rom of both shoulders Psychiatric: Patient has a normal mood and affect. behavior is normal. Judgment and thought content normal.   Recent Results (from the past 2160 hour(s))  NM Myocar Multi W/Spect W/Wall Motion / EF     Status: None   Collection Time: 05/15/18 12:27 PM  Result Value Ref Range   Rest HR 86 bpm   Rest BP 129/81 mmHg   Exercise duration (sec) 0 sec   Percent HR 87 %   Exercise duration (min) 4 min   Estimated workload  4.6 METS   Peak HR 146 bpm   Peak BP 165/80 mmHg   MPHR 166 bpm   SSS 1    SRS 2    SDS 0    TID 0.76    LV sys vol 6 mL   LV dias vol 30 46 - 106 mL  POCT HgB A1C     Status: Normal   Collection Time: 06/03/18 11:16 AM  Result Value Ref Range   Hemoglobin A1C  4.0 - 5.6 %   HbA1c POC (<> result, manual entry)  4.0 - 5.6 %   HbA1c, POC (prediabetic range) 5.9 5.7 - 6.4 %   HbA1c, POC (controlled diabetic range)  0.0 - 7.0 %     PHQ2/9: Depression screen [facility] 2/9 06/03/2018 12/03/2017 04/04/2017 01/25/2017 03/15/2016  Decreased Interest 0 0 0 0 0  Down, Depressed, Hopeless 0 0 0 0 0  PHQ - 2 Score 0 0 0 0 0     Fall Risk: Fall Risk  06/03/2018 05/10/2018 04/12/2018 12/26/2017 12/03/2017  Falls in the past year? No No No No Yes  Number falls in past yr: - - - - 1  Injury with Fall? - - - - Yes  Comment - - - - Right Leg     Assessment & Plan  1. Cervical radicular pain  - predniSONE (STERAPRED UNI-PAK 48 TAB) 10 MG (48) TBPK tablet; Take as directed with food and not before bedtime  Dispense: 48 tablet; Refill: 0   2. Vaginal atrophy  - Prasterone (INTRAROSA) 6.5 MG INST; Place 1 tablet vaginally at bedtime. Dr. Marcelline Mates  Dispense: 51 each; Refill: 0  3. Tinea corporis  Resolved, used topical  medication, rash is gone   4. Prediabetes  Discussed life style modification and importance of following a diabetic diet

## 2018-07-19 ENCOUNTER — Ambulatory Visit (INDEPENDENT_AMBULATORY_CARE_PROVIDER_SITE_OTHER): Payer: 59

## 2018-07-19 DIAGNOSIS — Z23 Encounter for immunization: Secondary | ICD-10-CM

## 2018-07-22 ENCOUNTER — Telehealth: Payer: Self-pay | Admitting: Obstetrics and Gynecology

## 2018-07-22 DIAGNOSIS — J301 Allergic rhinitis due to pollen: Secondary | ICD-10-CM | POA: Diagnosis not present

## 2018-07-22 DIAGNOSIS — N952 Postmenopausal atrophic vaginitis: Secondary | ICD-10-CM

## 2018-07-22 NOTE — Telephone Encounter (Signed)
The patient is requesting samples of Intrarosa, and asked if the nurse can call her when she can pick those up, please advise, thanks.

## 2018-07-24 MED ORDER — PRASTERONE 6.5 MG VA INST: 1.0000 | VAGINAL_INSERT | Freq: Every day | VAGINAL | 11 refills | Status: DC

## 2018-07-25 NOTE — Telephone Encounter (Signed)
Pt was called and informed that we do not have any samples of Intrarosa in the office. Pt was asked if she wanted a prescription called in to her pharmacy. Pt stated that she would. prescription was called in.

## 2018-07-29 DIAGNOSIS — J301 Allergic rhinitis due to pollen: Secondary | ICD-10-CM | POA: Diagnosis not present

## 2018-08-05 DIAGNOSIS — J301 Allergic rhinitis due to pollen: Secondary | ICD-10-CM | POA: Diagnosis not present

## 2018-08-08 ENCOUNTER — Encounter: Payer: 59 | Admitting: Obstetrics and Gynecology

## 2018-08-09 ENCOUNTER — Encounter: Payer: 59 | Admitting: Obstetrics and Gynecology

## 2018-08-12 ENCOUNTER — Encounter: Payer: 59 | Admitting: Obstetrics and Gynecology

## 2018-08-12 DIAGNOSIS — J301 Allergic rhinitis due to pollen: Secondary | ICD-10-CM | POA: Diagnosis not present

## 2018-08-16 ENCOUNTER — Ambulatory Visit (INDEPENDENT_AMBULATORY_CARE_PROVIDER_SITE_OTHER): Payer: 59

## 2018-08-16 ENCOUNTER — Ambulatory Visit (INDEPENDENT_AMBULATORY_CARE_PROVIDER_SITE_OTHER): Payer: 59 | Admitting: Obstetrics and Gynecology

## 2018-08-16 ENCOUNTER — Encounter: Payer: Self-pay | Admitting: Obstetrics and Gynecology

## 2018-08-16 VITALS — BP 115/75 | HR 80 | Ht 63.0 in | Wt 153.0 lb

## 2018-08-16 DIAGNOSIS — N951 Menopausal and female climacteric states: Secondary | ICD-10-CM | POA: Diagnosis not present

## 2018-08-16 DIAGNOSIS — N952 Postmenopausal atrophic vaginitis: Secondary | ICD-10-CM

## 2018-08-16 DIAGNOSIS — Z23 Encounter for immunization: Secondary | ICD-10-CM

## 2018-08-16 NOTE — Progress Notes (Signed)
    GYNECOLOGY PROGRESS NOTE  Subjective:    Patient ID: Makayla Mason, female    DOB: Oct 25, 1966, 52 y.o.   MRN: [id number]  HPI  Patient is a 52 y.o. G23P2002 female who presents for 6 week f/u of vasomotor symptoms and vaginal atrophy.  She was initiated on Intrarosa and Paxil last visit.  Notes overall that her symptoms are doing very well. States her hot flashes have decreased to ~ 1 per day. Denies any side effects.   The following portions of the patient's history were reviewed and updated as appropriate: allergies, current medications, past family history, past medical history, past social history, past surgical history and problem list.  Review of Systems Pertinent items noted in HPI and remainder of comprehensive ROS otherwise negative.   Objective:   Blood pressure 115/75, pulse 80, height 5\' 3"  (1.6 m), weight 153 lb (69.4 kg). General appearance: alert and no distress Pelvic exam: deferred, as patient notes that she has another appointment right after this that she needs to get to.    Assessment:   Menopausal vasomotor symptoms Vaginal atrophy  Plan:   - Patient notes symptoms have vastly improved. To continue use of medications (Paxil and Intrarosa). Advised that she will continue to notice improvement over the next several weeks to months.  - To f/u as needed  Rubie Maid, MD [facility]

## 2018-08-16 NOTE — Progress Notes (Signed)
Pt is present today for a follow up from menopausal symptoms. Pt stated that her menopausal symptoms are decreasing.

## 2018-08-16 NOTE — Progress Notes (Signed)
Patient tolerated vaccine well. She denies having allergies to vaccine.

## 2018-08-19 DIAGNOSIS — J301 Allergic rhinitis due to pollen: Secondary | ICD-10-CM | POA: Diagnosis not present

## 2018-08-22 ENCOUNTER — Telehealth: Payer: Self-pay

## 2018-08-22 NOTE — Telephone Encounter (Signed)
Yes we can see Dr. Jefm Bryant notes

## 2018-08-22 NOTE — Telephone Encounter (Signed)
Copied from [place] [phone]. Topic: Appointment Scheduling - Scheduling Inquiry for Clinic >> Aug 22, 2018 11:21 AM Makayla Mason wrote: Reason for CRM: Pt would like to know if she could schedule a cortisone injection-stating she was advised by [facility] clinic doctor to schedule this with PCP. Pt would like to know if x-ray from [facility] can be accessed by Dr. Ancil Boozer before scheduling appt.

## 2018-08-26 ENCOUNTER — Ambulatory Visit: Payer: 59 | Admitting: Family Medicine

## 2018-08-26 DIAGNOSIS — J301 Allergic rhinitis due to pollen: Secondary | ICD-10-CM | POA: Diagnosis not present

## 2018-09-02 DIAGNOSIS — J301 Allergic rhinitis due to pollen: Secondary | ICD-10-CM | POA: Diagnosis not present

## 2018-09-09 DIAGNOSIS — J301 Allergic rhinitis due to pollen: Secondary | ICD-10-CM | POA: Diagnosis not present

## 2018-09-10 DIAGNOSIS — J301 Allergic rhinitis due to pollen: Secondary | ICD-10-CM | POA: Diagnosis not present

## 2018-09-13 ENCOUNTER — Ambulatory Visit (INDEPENDENT_AMBULATORY_CARE_PROVIDER_SITE_OTHER): Payer: 59 | Admitting: Family Medicine

## 2018-09-13 ENCOUNTER — Ambulatory Visit: Payer: 59 | Admitting: Family Medicine

## 2018-09-13 ENCOUNTER — Encounter: Payer: Self-pay | Admitting: Family Medicine

## 2018-09-13 VITALS — BP 118/68 | HR 90 | Temp 98.2°F | Resp 16 | Ht 63.0 in | Wt 152.2 lb

## 2018-09-13 DIAGNOSIS — M5412 Radiculopathy, cervical region: Secondary | ICD-10-CM

## 2018-09-13 MED ORDER — GABAPENTIN 100 MG PO CAPS: ORAL_CAPSULE | ORAL | 1 refills | Status: DC

## 2018-09-13 NOTE — Progress Notes (Signed)
Name: Makayla Mason   MRN: [id number]    DOB: 12-22-1965   Date:09/13/2018       Progress Note  Subjective  Chief Complaint  Chief Complaint  Patient presents with  . Follow-up    pinched nerve right shoulder, prednisone did not help    HPI  Pt presents with concern for ongoing right shoulder pain & paresthesia - saw Dr. Ancil Boozer on 07/15/18, was given prednisone taper and this did not help her symptoms. She states she noticed numbness and tingling on right upper shoulder and back about 3-4 months ago, at times has some tingling on right arm. She states feels worse when taking a shower/when water hits it; endorses some neck soreness. Denies balance problems, no trauma or headache, denies weakness.  Patient Active Problem List   Diagnosis Date Noted  . Cervical radicular pain 07/15/2018  . Overweight (BMI 25.0-29.9) 01/29/2018  . Right hip pain 01/29/2018  . Insulin resistance 02/28/2017  . Low grade squamous intraepith lesion on cytologic smear cervix (lgsil) 02/28/2017  . GERD without esophagitis 03/15/2016  . Left ankle pain 03/15/2016  . Allergic rhinitis, seasonal 05/21/2015  . Scoliosis 05/21/2015  . 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 Smoking status: Never Smoker  . Smokeless tobacco: Never Used  Substance Use Topics  . Alcohol use: Yes    Alcohol/week: 0.0 standard drinks    Comment: occasional     Current Outpatient Medications:  .  AUVI-Q 0.3 MG/0.3ML SOAJ injection, INJECT AS NEEDED FOR SEVERE ALLERGIC REACTION INCLUDING ANAPHYLAXIS AS DIRECTED, Disp: , Rfl: 1 .  levocetirizine (XYZAL) 5 MG tablet, Take 1 tablet (5 mg total) by mouth every evening., Disp: 30 tablet, Rfl: 3 .  montelukast (SINGULAIR) 10 MG tablet, Take 1 tablet (10 mg total) by mouth at bedtime., Disp: 30 tablet, Rfl: 3 .  NASACORT ALLERGY 24HR 55 MCG/ACT AERO nasal inhaler, PLACE 2 SPRAYS INTO THE NOSE DAILY., Disp: 16.9 mL, Rfl: 3 .  omeprazole  (PRILOSEC) 40 MG capsule, Take 1 capsule (40 mg total) by mouth daily., Disp: 90 capsule, Rfl: 1 .  Prasterone (INTRAROSA) 6.5 MG INST, Place 1 tablet vaginally at bedtime., Disp: 30 each, Rfl: 11  Allergies  Allergen Reactions  . Codeine Nausea And Vomiting    I personally reviewed active problem list, medication list, allergies, notes from last encounter, lab results with the patient/caregiver today.  ROS  Constitutional: Negative for fever or weight change.  Respiratory: Negative for cough and shortness of breath.   Cardiovascular: Negative for chest pain or palpitations.  Gastrointestinal: Negative for abdominal pain, no bowel changes.  Musculoskeletal: Negative for gait problem or joint swelling. See HPI Skin: Negative for rash.  Neurological: Negative for dizziness or headache.  No other specific complaints in a complete review of systems (except as listed in HPI above).  Objective  Vitals:   09/13/18 0946  BP: 118/68  Pulse: 90  Resp: 16  Temp: 98.2 F (36.8 C)  TempSrc: Oral  SpO2: 98%  Weight: 152 lb 3.2 oz (69 kg)  Height: 5\' 3"  (1.6 m)   Body mass index is 26.96 kg/m.  Nursing Note and Vital Signs reviewed.  Physical Exam  Constitutional: Patient appears well-developed and well-nourished. No distress.  HENT: Head: Normocephalic and atraumatic. Neck: Normal range of motion. Neck supple. No JVD present. No thyromegaly present.  Cardiovascular: Normal rate, regular rhythm and normal heart sounds.  No murmur heard. No BLE  edema. Pulmonary/Chest: Effort normal and breath sounds normal. No respiratory distress.. Musculoskeletal: Normal range of motion, no joint effusions. No gross deformities, no right shoulder tenderness Neurological: Pt is alert and oriented to person, place, and time. No cranial nerve deficit. Coordination, balance, strength, speech and gait are normal. Sensation is altered to the right posterior shoulder Skin: Skin is warm and dry. No rash  noted. No erythema.  Psychiatric: Patient has a normal mood and affect. behavior is normal. Judgment and thought content normal.  No results found for this or any previous visit (from the past 72 hour(s)).  Assessment & Plan  1. Cervical radicular pain - Ambulatory referral to Orthopedic Surgery - gabapentin (NEURONTIN) 100 MG capsule; Take once daily at night x7 days, then increase to twice daily.  Dispense: 60 capsule; Refill: 1 - We will trial gabapentin, I do recommend ortho/spine specialist at this point to determine imaging vs nerve conduction vs other testing.  -Red flags and when to present for emergency care or RTC including fever >101.54F, chest pain, shortness of breath, new/worsening/un-resolving symptoms, extremity weakness reviewed with patient at time of visit. Follow up and care instructions discussed and provided in AVS.

## 2018-09-16 DIAGNOSIS — J301 Allergic rhinitis due to pollen: Secondary | ICD-10-CM | POA: Diagnosis not present

## 2018-09-23 DIAGNOSIS — J301 Allergic rhinitis due to pollen: Secondary | ICD-10-CM | POA: Diagnosis not present

## 2018-09-30 DIAGNOSIS — J301 Allergic rhinitis due to pollen: Secondary | ICD-10-CM | POA: Diagnosis not present

## 2018-10-07 DIAGNOSIS — J301 Allergic rhinitis due to pollen: Secondary | ICD-10-CM | POA: Diagnosis not present

## 2018-10-14 DIAGNOSIS — J301 Allergic rhinitis due to pollen: Secondary | ICD-10-CM | POA: Diagnosis not present

## 2018-10-21 DIAGNOSIS — J301 Allergic rhinitis due to pollen: Secondary | ICD-10-CM | POA: Diagnosis not present

## 2018-10-28 DIAGNOSIS — J301 Allergic rhinitis due to pollen: Secondary | ICD-10-CM | POA: Diagnosis not present

## 2018-10-29 ENCOUNTER — Other Ambulatory Visit: Payer: Self-pay

## 2018-10-29 DIAGNOSIS — R040 Epistaxis: Secondary | ICD-10-CM | POA: Diagnosis not present

## 2018-10-29 DIAGNOSIS — Z79899 Other long term (current) drug therapy: Secondary | ICD-10-CM | POA: Insufficient documentation

## 2018-10-29 DIAGNOSIS — J069 Acute upper respiratory infection, unspecified: Secondary | ICD-10-CM | POA: Diagnosis not present

## 2018-10-29 DIAGNOSIS — R0981 Nasal congestion: Secondary | ICD-10-CM | POA: Diagnosis present

## 2018-10-29 DIAGNOSIS — R05 Cough: Secondary | ICD-10-CM | POA: Diagnosis not present

## 2018-10-29 NOTE — ED Triage Notes (Signed)
Pt to the er for cold symptoms. Pt says she has nasal congestion that started yesterday. Pt says she also had a nose bleed and coughed up phlegm with blood. No acute distress at this time.

## 2018-10-30 ENCOUNTER — Emergency Department
Admission: EM | Admit: 2018-10-30 | Discharge: 2018-10-30 | Disposition: A | Discharge status: Home / Self Care | Payer: 59 | Attending: Emergency Medicine | Admitting: Emergency Medicine

## 2018-10-30 DIAGNOSIS — J069 Acute upper respiratory infection, unspecified: Secondary | ICD-10-CM

## 2018-10-30 DIAGNOSIS — R05 Cough: Secondary | ICD-10-CM | POA: Diagnosis not present

## 2018-10-30 DIAGNOSIS — Z79899 Other long term (current) drug therapy: Secondary | ICD-10-CM | POA: Diagnosis not present

## 2018-10-30 MED ORDER — TRIAMCINOLONE ACETONIDE 55 MCG/ACT NA AERO
1.0000 | INHALATION_SPRAY | Freq: Once | NASAL | Status: AC
  Administered 2018-10-30: 1 via NASAL
  Filled 2018-10-30: qty 10.8

## 2018-10-30 MED ORDER — BENZONATATE 200 MG PO CAPS: 200.0000 mg | ORAL_CAPSULE | Freq: Three times a day (TID) | ORAL | 0 refills | Status: DC | PRN

## 2018-10-30 MED ORDER — BENZONATATE 100 MG PO CAPS
200.0000 mg | ORAL_CAPSULE | Freq: Once | ORAL | Status: AC
  Administered 2018-10-30: 200 mg via ORAL
  Filled 2018-10-30: qty 2

## 2018-10-30 NOTE — ED Notes (Signed)
Cold like symptoms started Monday. Had allergy shot Monday. Some clear nasal drainage. Has had some nasal bleeding out of right side nasal passage. No fevers. But had chills earlier Tuesday. No coughing nausea or vomiting. EDP in this patient.

## 2018-10-30 NOTE — ED Notes (Signed)
Reviewed discharge paperwork with patient. All questions answered by this Probation officer. Patient educated on importance to wash hands and drink plenty of fluids. Patient signed discharge paperwork.

## 2018-10-30 NOTE — Discharge Instructions (Signed)
1.  You may take Tessalon Perle 3 times daily as needed for cough. 2.  You may resume Nasacort daily. 3.  Return to the ER for worsening symptoms, persistent vomiting, difficulty breathing or other concerns.

## 2018-10-30 NOTE — ED Notes (Signed)
Lung sounds clear 

## 2018-10-30 NOTE — ED Provider Notes (Signed)
[facility] Emergency Department Provider Note   ____________________________________________   First MD Initiated Contact with Patient 10/30/18 [phone]     (approximate)  I have reviewed the triage vital signs and the nursing notes.   HISTORY  Chief Complaint URI    HPI Makayla Mason is a 52 y.o. female who presents to the ED from home with a chief complaint of cold-like symptoms.  Patient reports onset of nasal congestion yesterday associated with cough productive of yellow sputum.  Patient is a non-smoker.  Patient states she had a mild nosebleed from her right nare today and subsequently coughed up some phlegm with blood.  Denies associated fever, chills, chest pain, shortness of breath, abdominal pain, nausea or vomiting.  Denies recent travel or trauma.  Denies anticoagulant use.   Past Medical History:  Diagnosis Date  . Allergic rhinitis, seasonal   . Mild scoliosis   . TMJ arthralgia    right side    Patient Active Problem List   Diagnosis Date Noted  . Cervical radicular pain 07/15/2018  . Overweight (BMI 25.0-29.9) 01/29/2018  . Right hip pain 01/29/2018  . Insulin resistance 02/28/2017  . Low grade squamous intraepith lesion on cytologic smear cervix (lgsil) 02/28/2017  . GERD without esophagitis 03/15/2016  . Left ankle pain 03/15/2016  . Allergic rhinitis, seasonal 05/21/2015  . Scoliosis 05/21/2015  . 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 COLONOSCOPY WITH PROPOFOL N/A 02/15/2018   Procedure: COLONOSCOPY WITH PROPOFOL;  Surgeon: Jonathon Bellows, MD;  Location: [facility] ENDOSCOPY;  Service: Gastroenterology;  Laterality: N/A;    Prior to Admission medications   Medication Sig Start Date End Date Taking? Authorizing Provider  AUVI-Q 0.3 MG/0.3ML SOAJ injection INJECT AS NEEDED FOR SEVERE ALLERGIC REACTION INCLUDING ANAPHYLAXIS AS DIRECTED 06/07/18   [provider]   benzonatate (TESSALON) 200 MG capsule Take 1 capsule (200 mg total) by mouth 3 (three) times daily as needed for cough. 10/30/18   Paulette Blanch, MD  gabapentin (NEURONTIN) 100 MG capsule Take once daily at night x7 days, then increase to twice daily. 09/13/18   Hubbard Hartshorn, FNP  levocetirizine (XYZAL) 5 MG tablet Take 1 tablet (5 mg total) by mouth every evening. 04/12/18   Poulose, Bethel Born, NP  montelukast (SINGULAIR) 10 MG tablet Take 1 tablet (10 mg total) by mouth at bedtime. 04/12/18   Poulose, Bethel Born, NP  NASACORT ALLERGY 24HR 55 MCG/ACT AERO nasal inhaler PLACE 2 SPRAYS INTO THE NOSE DAILY. 06/21/18   Steele Sizer, MD  omeprazole (PRILOSEC) 40 MG capsule Take 1 capsule (40 mg total) by mouth daily. 04/12/18   Poulose, Bethel Born, NP  Prasterone (INTRAROSA) 6.5 MG INST Place 1 tablet vaginally at bedtime. 07/24/18   Rubie Maid, MD    Allergies Codeine  Family History  Problem Relation Age of Onset  . Breast cancer Cousin   . Diabetes Mother   . Diabetes Father   . Hypertension Father   . Asthma Daughter   . Anemia Maternal Aunt   . Stroke Maternal Aunt   . Heart disease Maternal Grandmother   . Kidney disease Maternal Grandmother   . Hypertension Maternal Grandmother     Social History Social History   Tobacco Use  . Smoking status: Never Smoker  . Smokeless tobacco: Never Used  Substance Use Topics  . Alcohol use: Yes    Alcohol/week: 0.0 standard drinks    Comment:  occasional  . Drug use: No    Review of Systems  Constitutional: No fever/chills Eyes: No visual changes. ENT: Positive for nasal congestion and right nosebleed.  No sore throat. Cardiovascular: Denies chest pain. Respiratory: Positive for productive cough.  Denies shortness of breath. Gastrointestinal: No abdominal pain.  No nausea, no vomiting.  No diarrhea.  No constipation. Genitourinary: Negative for dysuria. Musculoskeletal: Negative for back pain. Skin: Negative for  rash. Neurological: Negative for headaches, focal weakness or numbness.   ____________________________________________   PHYSICAL EXAM:  VITAL SIGNS: ED Triage Vitals [10/29/18 2341]  Enc Vitals Group     BP (!) 161/81     Pulse Rate 90     Resp 18     Temp (!) 97 F (36.1 C)     Temp Source Oral     SpO2 99 %     Weight 145 lb (65.8 kg)     Height 5\' 3"  (1.6 m)     Head Circumference      Peak Flow      Pain Score 0     Pain Loc      Pain Edu?      Excl. in [place]?     Constitutional: Alert and oriented. Well appearing and in no acute distress. Eyes: Conjunctivae are normal. PERRL. EOMI. Head: Atraumatic. Nose: Congestion/rhinnorhea. No nosebleed from either nare. Mouth/Throat: Mucous membranes are moist. Oropharynx non-erythematous. Neck: No stridor.  Supple neck without meningismus. Cardiovascular: Normal rate, regular rhythm. Grossly normal heart sounds.  Good peripheral circulation. Respiratory: Normal respiratory effort.  No retractions. Lungs CTAB. Gastrointestinal: Soft and nontender. No distention. No abdominal bruits. No CVA tenderness. Musculoskeletal: No lower extremity tenderness nor edema.  No joint effusions. Neurologic:  Normal speech and language. No gross focal neurologic deficits are appreciated. No gait instability. Skin:  Skin is warm, dry and intact. No rash noted. Psychiatric: Mood and affect are normal. Speech and behavior are normal.  ____________________________________________   LABS (all labs ordered are listed, but only abnormal results are displayed)  Labs Reviewed - No data to display ____________________________________________  EKG  None ____________________________________________  RADIOLOGY  ED MD interpretation: None  Official radiology report(s): No results found.  ____________________________________________   PROCEDURES  Procedure(s) performed: None  Procedures  Critical Care performed:  No  ____________________________________________   INITIAL IMPRESSION / ASSESSMENT AND PLAN / ED COURSE  As part of my medical decision making, I reviewed the following data within the [facility] notes reviewed and incorporated, Old chart reviewed and Notes from prior ED visits   52 year old female who presents with URI symptoms.  Will prescribe Tessalon Perles and encourage patient to resume Nasacort.  Strict return precautions given.  Patient verbalizes understanding agrees with plan of care.      ____________________________________________   FINAL CLINICAL IMPRESSION(S) / ED DIAGNOSES  Final diagnoses:  Upper respiratory tract infection, unspecified type     ED Discharge Orders         Ordered    benzonatate (TESSALON) 200 MG capsule  3 times daily PRN     10/30/18 0054           Note:  This document was prepared using Dragon voice recognition software and may include unintentional dictation errors.    Paulette Blanch, MD 10/30/18 [phone]

## 2018-11-04 DIAGNOSIS — J301 Allergic rhinitis due to pollen: Secondary | ICD-10-CM | POA: Diagnosis not present

## 2018-11-07 ENCOUNTER — Ambulatory Visit (INDEPENDENT_AMBULATORY_CARE_PROVIDER_SITE_OTHER): Payer: 59 | Admitting: Nurse Practitioner

## 2018-11-07 ENCOUNTER — Encounter: Payer: Self-pay | Admitting: Nurse Practitioner

## 2018-11-07 VITALS — BP 124/70 | HR 81 | Temp 98.3°F | Resp 12 | Ht 63.0 in | Wt 147.5 lb

## 2018-11-07 DIAGNOSIS — J069 Acute upper respiratory infection, unspecified: Secondary | ICD-10-CM

## 2018-11-07 DIAGNOSIS — R8290 Unspecified abnormal findings in urine: Secondary | ICD-10-CM | POA: Diagnosis not present

## 2018-11-07 DIAGNOSIS — J302 Other seasonal allergic rhinitis: Secondary | ICD-10-CM | POA: Diagnosis not present

## 2018-11-07 LAB — POCT URINALYSIS DIPSTICK
Bilirubin, UA: NEGATIVE
Blood, UA: NEGATIVE
Glucose, UA: NEGATIVE
KETONES UA: NEGATIVE
Leukocytes, UA: NEGATIVE
Nitrite, UA: NEGATIVE
Protein, UA: NEGATIVE
Spec Grav, UA: 1.015 (ref 1.010–1.025)
Urobilinogen, UA: NEGATIVE E.U./dL — AB
pH, UA: 5 (ref 5.0–8.0)

## 2018-11-07 MED ORDER — TRIAMCINOLONE ACETONIDE 55 MCG/ACT NA AERO: 1.0000 | INHALATION_SPRAY | Freq: Every day | NASAL | 3 refills | Status: DC

## 2018-11-07 MED ORDER — LEVOCETIRIZINE DIHYDROCHLORIDE 5 MG PO TABS: 5.0000 mg | ORAL_TABLET | Freq: Every evening | ORAL | 3 refills | Status: DC

## 2018-11-07 NOTE — Progress Notes (Signed)
Name: Makayla Mason   MRN: [id number]    DOB: 23-Aug-1966   Date:11/07/2018       Progress Note  Subjective  Chief Complaint  Chief Complaint  Patient presents with  . Follow-up    ER  . URI    cough has made her sore throat, symptoms cleared  . Medication Refill    HPI Patient went to ER on christmas because she was blowing her nose and got a nose bleed then she coughed up some blood-tinged sputum. She was diagnosed with a viral upper respiratory infection. States still feels tired- but cough resolved congestion improved.  Patient endorses suprapubic pressure and foul odor to urine, no dysuria, fever or chills.   Patient Active Problem List   Diagnosis Date Noted  . Cervical radicular pain 07/15/2018  . Overweight (BMI 25.0-29.9) 01/29/2018  . Right hip pain 01/29/2018  . Insulin resistance 02/28/2017  . Low grade squamous intraepith lesion on cytologic smear cervix (lgsil) 02/28/2017  . GERD without esophagitis 03/15/2016  . Left ankle pain 03/15/2016  . Allergic rhinitis, seasonal 05/21/2015  . Scoliosis 05/21/2015  . 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 Allergic rhinitis, seasonal   . Mild scoliosis   . TMJ arthralgia    right side    Past Surgical History:  Procedure Laterality Date  . COLONOSCOPY WITH PROPOFOL N/A 02/15/2018   Procedure: COLONOSCOPY WITH PROPOFOL;  Surgeon: Jonathon Bellows, MD;  Location: [facility] ENDOSCOPY;  Service: Gastroenterology;  Laterality: N/A;    Social History   Tobacco Use  . Smoking status: Never Smoker  . Smokeless tobacco: Never Used  Substance Use Topics  . Alcohol use: Yes    Alcohol/week: 0.0 standard drinks    Comment: occasional     Current Outpatient Medications:  .  AUVI-Q 0.3 MG/0.3ML SOAJ injection, INJECT AS NEEDED FOR SEVERE ALLERGIC REACTION INCLUDING ANAPHYLAXIS AS DIRECTED, Disp: , Rfl: 1 .  benzonatate (TESSALON) 200 MG capsule, Take 1 capsule (200 mg total)  by mouth 3 (three) times daily as needed for cough., Disp: 20 capsule, Rfl: 0 .  levocetirizine (XYZAL) 5 MG tablet, Take 1 tablet (5 mg total) by mouth every evening., Disp: 30 tablet, Rfl: 3 .  montelukast (SINGULAIR) 10 MG tablet, Take 1 tablet (10 mg total) by mouth at bedtime., Disp: 30 tablet, Rfl: 3 .  NASACORT ALLERGY 24HR 55 MCG/ACT AERO nasal inhaler, PLACE 2 SPRAYS INTO THE NOSE DAILY., Disp: 16.9 mL, Rfl: 3 .  omeprazole (PRILOSEC) 40 MG capsule, Take 1 capsule (40 mg total) by mouth daily., Disp: 90 capsule, Rfl: 1 .  Prasterone (INTRAROSA) 6.5 MG INST, Place 1 tablet vaginally at bedtime., Disp: 30 each, Rfl: 11 .  gabapentin (NEURONTIN) 100 MG capsule, Take once daily at night x7 days, then increase to twice daily. (Patient not taking: Reported on 11/07/2018), Disp: 60 capsule, Rfl: 1  Allergies  Allergen Reactions  . Codeine Nausea And Vomiting    ROS   No other specific complaints in a complete review of systems (except as listed in HPI above).  Objective  Vitals:   11/07/18 1001  BP: 124/70  Pulse: 81  Resp: 12  Temp: 98.3 F (36.8 C)  TempSrc: Oral  SpO2: 97%  Weight: 147 lb 8 oz (66.9 kg)  Height: 5\' 3"  (1.6 m)    Body mass index is 26.13 kg/m.  Nursing Note and Vital Signs reviewed.  Physical Exam Constitutional:  Appearance: She is not ill-appearing.  HENT:     Head: Normocephalic and atraumatic.     Right Ear: External ear normal.     Left Ear: External ear normal.     Nose: Nose normal.     Mouth/Throat:     Pharynx: No oropharyngeal exudate or posterior oropharyngeal erythema.  Eyes:     General:        Right eye: No discharge.        Left eye: No discharge.     Conjunctiva/sclera: Conjunctivae normal.  Neck:     Musculoskeletal: Normal range of motion.  Cardiovascular:     Rate and Rhythm: Normal rate and regular rhythm.  Pulmonary:     Effort: Pulmonary effort is normal.     Breath sounds: Normal breath sounds.  Abdominal:      Palpations: Abdomen is soft.     Tenderness: There is no abdominal tenderness. There is no right CVA tenderness or left CVA tenderness.  Lymphadenopathy:     Cervical: No cervical adenopathy.  Skin:    General: Skin is warm and dry.     Findings: No rash.  Neurological:     General: No focal deficit present.     Mental Status: She is alert and oriented to person, place, and time.  Psychiatric:        Mood and Affect: Mood normal.        Judgment: Judgment normal.     No results found for this or any previous visit (from the past 48 hour(s)).  Assessment & Plan  1. Upper respiratory tract infection, unspecified type Resolved - triamcinolone (NASACORT ALLERGY 24HR) 55 MCG/ACT AERO nasal inhaler; Place 1 spray into the nose daily.  Dispense: 16.9 mL; Refill: 3 - levocetirizine (XYZAL) 5 MG tablet; Take 1 tablet (5 mg total) by mouth every evening.  Dispense: 30 tablet; Refill: 3  2. Seasonal allergic rhinitis, unspecified trigger - triamcinolone (NASACORT ALLERGY 24HR) 55 MCG/ACT AERO nasal inhaler; Place 1 spray into the nose daily.  Dispense: 16.9 mL; Refill: 3 - levocetirizine (XYZAL) 5 MG tablet; Take 1 tablet (5 mg total) by mouth every evening.  Dispense: 30 tablet; Refill: 3  3. Foul smelling urine Drink plenty of water to avoid dehydration, concentrated urine and UTIs  - POCT Urinalysis Dipstick  See AVS

## 2018-11-07 NOTE — Patient Instructions (Addendum)
-   You do not have a urinary tract infection. Please drink plenty of water at least 8 glasses a day, avoid caffeine, wipe front to back when urinating.  -It seems like your upper respiratory infection has cleared. Below is some general information that can be helpful if you get another URI: Antibiotics will not reduce the number of days you are ill or prevent you from getting bacterial rhinosinusitis. A URI can take up to 14 days to resolve, but typically last between 7-11 days. Your body is so smart and strong that it will be fighting this illness off for you but it is important that you drink plenty of fluids, rest. Cover your nose/mouth when you cough or sneeze and wash your hands well and often. Here are some helpful things you can use or pick up over the counter from the pharmacy to help with your symptoms:   For Fever/Pain: Acetaminophen every 6 hours as needed (maximum of 3000mg  a day). If you are still uncomfortable you can add ibuprofen OR naproxen  For coughing: try dextromethorphan for a cough suppressant, and/or a cool mist humidifier, lozenges  For sore throat: saline gargles, honey herbal tea, lozenges, throat spray  To dry out your nose: try an antihistamine like loratadine (non-sedating) or diphenhydramine (sedating) or others To relieve a stuffy nose: try an oral decongestant  Like pseudoephedrine if you are under the age of 72 and do not have high blood pressure, neti pot To make blowing your nose easier: guaifenesin

## 2018-11-11 DIAGNOSIS — J301 Allergic rhinitis due to pollen: Secondary | ICD-10-CM | POA: Diagnosis not present

## 2018-11-18 DIAGNOSIS — J301 Allergic rhinitis due to pollen: Secondary | ICD-10-CM | POA: Diagnosis not present

## 2018-11-20 ENCOUNTER — Other Ambulatory Visit: Payer: Self-pay | Admitting: Family Medicine

## 2018-11-20 DIAGNOSIS — Z20828 Contact with and (suspected) exposure to other viral communicable diseases: Secondary | ICD-10-CM

## 2018-11-20 MED ORDER — OSELTAMIVIR PHOSPHATE 75 MG PO CAPS: 75.0000 mg | ORAL_CAPSULE | Freq: Every day | ORAL | 0 refills | Status: AC

## 2018-11-20 NOTE — Progress Notes (Signed)
Pt in today - daugher was hospitalized for Flu B, and she has been taking care of her daughter - we will send in prophylaxis tamiflu.

## 2018-11-22 ENCOUNTER — Ambulatory Visit (INDEPENDENT_AMBULATORY_CARE_PROVIDER_SITE_OTHER): Payer: 59 | Admitting: Family Medicine

## 2018-11-22 ENCOUNTER — Encounter: Payer: Self-pay | Admitting: Family Medicine

## 2018-11-22 VITALS — BP 110/70 | HR 99 | Temp 98.1°F | Resp 14 | Ht 63.0 in | Wt 147.4 lb

## 2018-11-22 DIAGNOSIS — R05 Cough: Secondary | ICD-10-CM

## 2018-11-22 DIAGNOSIS — R0989 Other specified symptoms and signs involving the circulatory and respiratory systems: Secondary | ICD-10-CM | POA: Diagnosis not present

## 2018-11-22 DIAGNOSIS — R059 Cough, unspecified: Secondary | ICD-10-CM

## 2018-11-22 MED ORDER — GUAIFENESIN ER 600 MG PO TB12: 600.0000 mg | ORAL_TABLET | Freq: Two times a day (BID) | ORAL | 0 refills | Status: DC

## 2018-11-22 MED ORDER — PROMETHAZINE-DM 6.25-15 MG/5ML PO SYRP: 5.0000 mL | ORAL_SOLUTION | Freq: Four times a day (QID) | ORAL | 0 refills | Status: DC | PRN

## 2018-11-22 NOTE — Progress Notes (Signed)
Name: Makayla Mason   MRN: [id number]    DOB: [birth year]   Date:11/22/2018       Progress Note  Subjective  Chief Complaint  Chief Complaint  Patient presents with  . Follow-up    HPI  Pt presents with concern for URI - she was given tamiflu a few days ago for flu B exposure (daughter was hospitalized for flu B and sepsis).  She notes cough for about 5 days that is productive with yellow/green sputum.  Endorses nasal congestion - baseline for her with her allergies. Denies body aches, fevers/chills, chest pain, shortness of breath, ear pain/pressure.  Taking nasacort and singulair and xyzal.  She stopped mucinex 4 days ago.   Patient Active Problem List   Diagnosis Date Noted  . Cervical radicular pain 07/15/2018  . Overweight (BMI 25.0-29.9) 01/29/2018  . Right hip pain 01/29/2018  . Insulin resistance 02/28/2017  . Low grade squamous intraepith lesion on cytologic smear cervix (lgsil) 02/28/2017  . GERD without esophagitis 03/15/2016  . Left ankle pain 03/15/2016  . Allergic rhinitis, seasonal 05/21/2015  . Scoliosis 05/21/2015  . 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 Smoking status: Never Smoker  . Smokeless tobacco: Never Used  Substance Use Topics  . Alcohol use: Yes    Alcohol/week: 0.0 standard drinks    Comment: occasional    Current Outpatient Medications:  .  AUVI-Q 0.3 MG/0.3ML SOAJ injection, INJECT AS NEEDED FOR SEVERE ALLERGIC REACTION INCLUDING ANAPHYLAXIS AS DIRECTED, Disp: , Rfl: 1 .  levocetirizine (XYZAL) 5 MG tablet, Take 1 tablet (5 mg total) by mouth every evening., Disp: 30 tablet, Rfl: 3 .  montelukast (SINGULAIR) 10 MG tablet, Take 1 tablet (10 mg total) by mouth at bedtime., Disp: 30 tablet, Rfl: 3 .  omeprazole (PRILOSEC) 40 MG capsule, Take 1 capsule (40 mg total) by mouth daily., Disp: 90 capsule, Rfl: 1 .  oseltamivir (TAMIFLU) 75 MG capsule, Take 1 capsule (75 mg total) by mouth daily for 7  days., Disp: 7 capsule, Rfl: 0 .  Prasterone (INTRAROSA) 6.5 MG INST, Place 1 tablet vaginally at bedtime., Disp: 30 each, Rfl: 11 .  triamcinolone (NASACORT ALLERGY 24HR) 55 MCG/ACT AERO nasal inhaler, Place 1 spray into the nose daily., Disp: 16.9 mL, Rfl: 3 .  benzonatate (TESSALON) 200 MG capsule, Take 1 capsule (200 mg total) by mouth 3 (three) times daily as needed for cough. (Patient not taking: Reported on 11/22/2018), Disp: 20 capsule, Rfl: 0 .  gabapentin (NEURONTIN) 100 MG capsule, Take once daily at night x7 days, then increase to twice daily. (Patient not taking: Reported on 11/07/2018), Disp: 60 capsule, Rfl: 1  Allergies  Allergen Reactions  . Codeine Nausea And Vomiting    I personally reviewed active problem list, medication list, allergies, notes from last encounter, lab results with the patient/caregiver today.  ROS  Constitutional: Negative for fever or weight change.  Respiratory: Positive for cough and negative for shortness of breath.   Cardiovascular: Negative for chest pain or palpitations.  Gastrointestinal: Negative for abdominal pain, no bowel changes.  Musculoskeletal: Negative for gait problem or joint swelling.  Skin: Negative for rash.  Neurological: Negative for dizziness or headache.  No other specific complaints in a complete review of systems (except as listed in HPI above).  Objective  Vitals:   11/22/18 0958  BP: 110/70  Pulse: 99  Resp: 14  Temp: 98.1 F (36.7 C)  TempSrc: Oral  SpO2: 99%  Weight: 147 lb 6.4 oz (66.9 kg)  Height: 5\' 3"  (1.6 m)   Body mass index is 26.11 kg/m.  Nursing Note and Vital Signs reviewed.  Physical Exam  Constitutional: Patient appears well-developed and well-nourished. Obese No distress.  HEENT: head atraumatic, normocephalic, pupils equal and reactive to light, Bilateral TM's without erythema or effusion,  bilateral maxillary and frontal sinuses are non-tender, neck supple without lymphadenopathy, throat  within normal limits - no erythema or exudate, no tonsillar swelling Cardiovascular: Normal rate, regular rhythm and normal heart sounds.  No murmur heard. No BLE edema. Pulmonary/Chest: Effort normal and breath sounds clear bilaterally. No respiratory distress. Congested cough present during examination. Abdominal: Soft, bowel sounds normal, there is no tenderness, no HSM Psychiatric: Patient has a normal mood and affect. behavior is normal. Judgment and thought content normal.  No results found for this or any previous visit (from the past 72 hour(s)).  Assessment & Plan  1. Cough - guaiFENesin (MUCINEX) 600 MG 12 hr tablet; Take 1 tablet (600 mg total) by mouth 2 (two) times daily.  Dispense: 15 tablet; Refill: 0 - promethazine-dextromethorphan (PROMETHAZINE-DM) 6.25-15 MG/5ML syrup; Take 5 mLs by mouth 4 (four) times daily as needed for cough.  Dispense: 118 mL; Refill: 0  2. Chest congestion - guaiFENesin (MUCINEX) 600 MG 12 hr tablet; Take 1 tablet (600 mg total) by mouth 2 (two) times daily.  Dispense: 15 tablet; Refill: 0 - promethazine-dextromethorphan (PROMETHAZINE-DM) 6.25-15 MG/5ML syrup; Take 5 mLs by mouth 4 (four) times daily as needed for cough.  Dispense: 118 mL; Refill: 0  -Red flags and when to present for emergency care or RTC including fever >101.19F, chest pain, shortness of breath, new/worsening/un-resolving symptoms, reviewed with patient at time of visit. Follow up and care instructions discussed and provided in AVS.

## 2018-11-25 DIAGNOSIS — J301 Allergic rhinitis due to pollen: Secondary | ICD-10-CM | POA: Diagnosis not present

## 2018-12-02 ENCOUNTER — Other Ambulatory Visit: Payer: Self-pay | Admitting: Family Medicine

## 2018-12-02 ENCOUNTER — Telehealth: Payer: Self-pay | Admitting: Family Medicine

## 2018-12-02 DIAGNOSIS — J301 Allergic rhinitis due to pollen: Secondary | ICD-10-CM | POA: Diagnosis not present

## 2018-12-02 DIAGNOSIS — R05 Cough: Secondary | ICD-10-CM

## 2018-12-02 DIAGNOSIS — R059 Cough, unspecified: Secondary | ICD-10-CM

## 2018-12-02 DIAGNOSIS — R0989 Other specified symptoms and signs involving the circulatory and respiratory systems: Secondary | ICD-10-CM

## 2018-12-02 NOTE — Telephone Encounter (Signed)
Do she need to come in for appointment for refill on this medication

## 2018-12-02 NOTE — Telephone Encounter (Signed)
Pt is requesting a refill on Promethazine W/DM. [facility] pharmacy

## 2018-12-03 MED ORDER — PROMETHAZINE-DM 6.25-15 MG/5ML PO SYRP: 5.0000 mL | ORAL_SOLUTION | Freq: Four times a day (QID) | ORAL | 0 refills | Status: DC | PRN

## 2018-12-03 NOTE — Telephone Encounter (Signed)
Patient notified

## 2018-12-03 NOTE — Telephone Encounter (Signed)
Will provide 1 additional refill, if not having symptom relief in 3-5 days, needs follow up.

## 2018-12-04 ENCOUNTER — Ambulatory Visit (INDEPENDENT_AMBULATORY_CARE_PROVIDER_SITE_OTHER): Payer: 59 | Admitting: Family Medicine

## 2018-12-04 ENCOUNTER — Encounter: Payer: Self-pay | Admitting: Family Medicine

## 2018-12-04 VITALS — BP 120/80 | HR 80 | Temp 97.7°F | Resp 16 | Ht 63.0 in | Wt 145.4 lb

## 2018-12-04 DIAGNOSIS — E663 Overweight: Secondary | ICD-10-CM

## 2018-12-04 DIAGNOSIS — R059 Cough, unspecified: Secondary | ICD-10-CM

## 2018-12-04 DIAGNOSIS — K219 Gastro-esophageal reflux disease without esophagitis: Secondary | ICD-10-CM | POA: Diagnosis not present

## 2018-12-04 DIAGNOSIS — Z13220 Encounter for screening for lipoid disorders: Secondary | ICD-10-CM | POA: Diagnosis not present

## 2018-12-04 DIAGNOSIS — J3089 Other allergic rhinitis: Secondary | ICD-10-CM | POA: Diagnosis not present

## 2018-12-04 DIAGNOSIS — J302 Other seasonal allergic rhinitis: Secondary | ICD-10-CM | POA: Diagnosis not present

## 2018-12-04 DIAGNOSIS — E8881 Metabolic syndrome: Secondary | ICD-10-CM | POA: Diagnosis not present

## 2018-12-04 DIAGNOSIS — R05 Cough: Secondary | ICD-10-CM

## 2018-12-04 DIAGNOSIS — R7303 Prediabetes: Secondary | ICD-10-CM

## 2018-12-04 DIAGNOSIS — J301 Allergic rhinitis due to pollen: Secondary | ICD-10-CM | POA: Diagnosis not present

## 2018-12-04 MED ORDER — OMEPRAZOLE 40 MG PO CPDR: 40.0000 mg | DELAYED_RELEASE_CAPSULE | Freq: Every day | ORAL | 1 refills | Status: DC

## 2018-12-04 NOTE — Progress Notes (Signed)
Name: [name]   MRN: [id number]    DOB: 1966-01-13   Date:12/04/2018       Progress Note  Subjective  Chief Complaint  Chief Complaint  Patient presents with  . Prediabetes  . Gastroesophageal Reflux  . URI    HPI  Insulin Resistance: She denies polyphagia, polyuria or polyphagia. Discussed life style modification also.HgbA1C 6.0 went down to 5.3% ,she is off medication, could not tolerate metformin, she is also losing weight with life style modification, she is doing well.    GERD: she takes Omeprazole daily, and symptoms are controlled, no heartburn or regurgitation, avoiding fried food and spicy food. Discussed long term use of PPI, she has been unable to wean her self off PPI   Perennial AR: she started allergy shots Fall 2019 with ENT and states nasal congestion has improved, still has a cough but may secondary to recent URI/bronchitis. No wheezing or SOB. Denies sneezing or rhinorrhea.   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 Marcelline Mates last year and had normal pap smear with positive HPV, she will follow up in April    Patient Active Problem List   Diagnosis Date Noted  . Cervical radicular pain 07/15/2018  . Overweight (BMI 25.0-29.9) 01/29/2018  . Right hip pain 01/29/2018  . Insulin resistance 02/28/2017  . Low grade squamous intraepith lesion on cytologic smear cervix (lgsil) 02/28/2017  . GERD without esophagitis 03/15/2016  . Left ankle pain 03/15/2016  . Allergic rhinitis, seasonal 05/21/2015  . Scoliosis 05/21/2015  . 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 COLONOSCOPY WITH PROPOFOL N/A 02/15/2018   Procedure: COLONOSCOPY WITH PROPOFOL;  Surgeon: Jonathon Bellows, MD;  Location: [facility] ENDOSCOPY;  Service: Gastroenterology;  Laterality: N/A;    Family History  Problem Relation Age of Onset  . Breast cancer Cousin   . Diabetes Mother   . Diabetes Father   . Hypertension Father    . Asthma Daughter   . Anemia Maternal Aunt   . Stroke Maternal Aunt   . Heart disease Maternal Grandmother   . Kidney disease Maternal Grandmother   . Hypertension Maternal Grandmother     Social History   Socioeconomic History  . Marital status: Divorced    Spouse name: Not on file  . Number of children: 2  . Years of education: Not on file  . Highest education level: Bachelor's degree (e.g., BA, AB, BS)  Occupational History  . Not on file  Social Needs  . Financial resource strain: Not hard at all  . Food insecurity:    Worry: Never true    Inability: Never true  . Transportation needs:    Medical: No    Non-medical: No  Tobacco Use  . Smoking status: Never Smoker  . Smokeless tobacco: Never Used  Substance and Sexual Activity  . Alcohol use: Yes    Alcohol/week: 0.0 standard drinks    Comment: occasional  . Drug use: No  . Sexual activity: Not Currently    Birth control/protection: Post-menopausal  Lifestyle  . Physical activity:    Days per week: Not on file    Minutes per session: Not on file  . Stress: Not at all  Relationships  . Social connections:    Talks on phone: Not on file    Gets together: Not on file    Attends religious service: Not on file    Active member of club or  organization: Not on file    Attends meetings of clubs or organizations: Not on file    Relationship status: Not on file  . Intimate partner violence:    Fear of current or ex partner: No    Emotionally abused: No    Physically abused: No    Forced sexual activity: No  Other Topics Concern  . Not on file  Social History Narrative   Lives in [place], grown daughters and grandson are at home     Current Outpatient Medications:  .  AUVI-Q 0.3 MG/0.3ML SOAJ injection, INJECT AS NEEDED FOR SEVERE ALLERGIC REACTION INCLUDING ANAPHYLAXIS AS DIRECTED, Disp: , Rfl: 1 .  levocetirizine (XYZAL) 5 MG tablet, Take 1 tablet (5 mg total) by mouth every evening., Disp: 30 tablet,  Rfl: 3 .  montelukast (SINGULAIR) 10 MG tablet, Take 1 tablet (10 mg total) by mouth at bedtime., Disp: 30 tablet, Rfl: 3 .  omeprazole (PRILOSEC) 40 MG capsule, Take 1 capsule (40 mg total) by mouth daily., Disp: 90 capsule, Rfl: 1 .  Prasterone (INTRAROSA) 6.5 MG INST, Place 1 tablet vaginally at bedtime., Disp: 30 each, Rfl: 11 .  promethazine-dextromethorphan (PROMETHAZINE-DM) 6.25-15 MG/5ML syrup, Take 5 mLs by mouth 4 (four) times daily as needed for cough., Disp: 118 mL, Rfl: 0 .  triamcinolone (NASACORT ALLERGY 24HR) 55 MCG/ACT AERO nasal inhaler, Place 1 spray into the nose daily., Disp: 16.9 mL, Rfl: 3  Allergies  Allergen Reactions  . Codeine Nausea And Vomiting    I personally reviewed active problem list, medication list, allergies, family history, social history with the patient/caregiver today.   ROS  Constitutional: Negative for fever or weight change.  Respiratory: positive  for cough and shortness of breath.   Cardiovascular: Negative for chest pain or palpitations.  Gastrointestinal: Negative for abdominal pain, no bowel changes.  Musculoskeletal: Negative for gait problem or joint swelling.  Skin: Negative for rash.  Neurological: Negative for dizziness or headache.  No other specific complaints in a complete review of systems (except as listed in HPI above).  Objective  Vitals:   12/04/18 0939  BP: 120/80  Pulse: 80  Resp: 16  Temp: 97.7 F (36.5 C)  TempSrc: Oral  SpO2: 99%  Weight: 145 lb 6.4 oz (66 kg)  Height: 5\' 3"  (1.6 m)    Body mass index is 25.76 kg/m.  Physical Exam  Constitutional: Patient appears well-developed and well-nourished. No distress.  HEENT: head atraumatic, normocephalic, pupils equal and reactive to light, neck supple, throat within normal limits Cardiovascular: Normal rate, regular rhythm and normal heart sounds.  No murmur heard. No BLE edema. Pulmonary/Chest: Effort normal and breath sounds normal. No respiratory  distress. Abdominal: Soft.  There is no tenderness. Psychiatric: Patient has a normal mood and affect. behavior is normal. Judgment and thought content normal.  Recent Results (from the past 2160 hour(s))  POCT Urinalysis Dipstick     Status: Abnormal   Collection Time: 11/07/18 10:55 AM  Result Value Ref Range   Color, UA yellow    Clarity, UA clear    Glucose, UA Negative Negative   Bilirubin, UA Negative    Ketones, UA Negative    Spec Grav, UA 1.015 1.010 - 1.025   Blood, UA Negative    pH, UA 5.0 5.0 - 8.0   Protein, UA Negative Negative   Urobilinogen, UA negative (A) 0.2 or 1.0 E.U./dL   Nitrite, UA Negative    Leukocytes, UA Negative Negative   Appearance  Odor        PHQ2/9: Depression screen [facility] 2/9 11/22/2018 11/07/2018 09/13/2018 06/03/2018 12/03/2017  Decreased Interest 0 0 0 0 0  Down, Depressed, Hopeless 0 0 0 0 0  PHQ - 2 Score 0 0 0 0 0  Altered sleeping 0 0 0 - -  Tired, decreased energy 0 0 0 - -  Change in appetite 0 0 0 - -  Feeling bad or failure about yourself  0 0 0 - -  Trouble concentrating 0 0 0 - -  Moving slowly or fidgety/restless 0 0 0 - -  Suicidal thoughts 0 0 0 - -  PHQ-9 Score 0 0 0 - -  Difficult doing work/chores Not difficult at all Not difficult at all Not difficult at all - -     Fall Risk: Fall Risk  12/04/2018 11/22/2018 11/07/2018 09/13/2018 06/03/2018  Falls in the past year? 0 0 0 0 No  Number falls in past yr: 0 0 0 - -  Injury with Fall? 0 0 0 - -  Comment - - - - -  Follow up - Falls evaluation completed - - -      Assessment & Plan  1. GERD without esophagitis  - omeprazole (PRILOSEC) 40 MG capsule; Take 1 capsule (40 mg total) by mouth daily.  Dispense: 90 capsule; Refill: 1  2. Insulin resistance  - Hemoglobin A1c  3. Prediabetes  - COMPLETE METABOLIC PANEL WITH GFR - Hemoglobin A1c  4. Perennial allergic rhinitis with seasonal variation  Going to ENT and getting allergy shots   5. Overweight (BMI  25.0-29.9)  Discussed with the patient the risk posed by an increased BMI. Discussed importance of portion control, calorie counting and at least 150 minutes of physical activity weekly. Avoid sweet beverages and drink more water. Eat at least 6 servings of fruit and vegetables daily   6. Cough  Finally resolving, still taking cough suppressant prn, offered CXR but she thinks it is almost gone and will call back for CXR if no resolution in the next couple weeks   7. Lipid screening  - Lipid panel

## 2018-12-05 LAB — COMPLETE METABOLIC PANEL WITH GFR
AG Ratio: 1.4 (calc) (ref 1.0–2.5)
ALT: 10 U/L (ref 6–29)
AST: 12 U/L (ref 10–35)
Albumin: 4.2 g/dL (ref 3.6–5.1)
Alkaline phosphatase (APISO): 87 U/L (ref 33–130)
BUN: 16 mg/dL (ref 7–25)
CO2: 29 mmol/L (ref 20–32)
Calcium: 10.1 mg/dL (ref 8.6–10.4)
Chloride: 107 mmol/L (ref 98–110)
Creat: 0.63 mg/dL (ref 0.50–1.05)
GFR, Est African American: 118 mL/min/{1.73_m2} (ref >=60)
GFR, Est Non African American: 102 mL/min/{1.73_m2} (ref >=60)
Globulin: 3 g/dL (calc) (ref 1.9–3.7)
Glucose, Bld: 82 mg/dL (ref 65–99)
Potassium: 5.2 mmol/L (ref 3.5–5.3)
Sodium: 143 mmol/L (ref 135–146)
Total Bilirubin: 0.5 mg/dL (ref 0.2–1.2)
Total Protein: 7.2 g/dL (ref 6.1–8.1)

## 2018-12-05 LAB — LIPID PANEL
CHOLESTEROL: 181 mg/dL (ref <200)
HDL: 53 mg/dL (ref >50)
LDL Cholesterol (Calc): 107 mg/dL (calc) — ABNORMAL HIGH
Non-HDL Cholesterol (Calc): 128 mg/dL (calc) (ref <130)
Total CHOL/HDL Ratio: 3.4 (calc) (ref <5.0)
Triglycerides: 114 mg/dL (ref <150)

## 2018-12-05 LAB — HEMOGLOBIN A1C
Hgb A1c MFr Bld: 6 % of total Hgb — ABNORMAL HIGH (ref <5.7)
Mean Plasma Glucose: 126 (calc)
eAG (mmol/L): 7 (calc)

## 2018-12-09 DIAGNOSIS — J301 Allergic rhinitis due to pollen: Secondary | ICD-10-CM | POA: Diagnosis not present

## 2018-12-16 DIAGNOSIS — J301 Allergic rhinitis due to pollen: Secondary | ICD-10-CM | POA: Diagnosis not present

## 2018-12-23 DIAGNOSIS — J301 Allergic rhinitis due to pollen: Secondary | ICD-10-CM | POA: Diagnosis not present

## 2018-12-30 DIAGNOSIS — J301 Allergic rhinitis due to pollen: Secondary | ICD-10-CM | POA: Diagnosis not present

## 2018-12-31 ENCOUNTER — Ambulatory Visit (INDEPENDENT_AMBULATORY_CARE_PROVIDER_SITE_OTHER): Payer: 59 | Admitting: Family Medicine

## 2018-12-31 ENCOUNTER — Encounter: Payer: Self-pay | Admitting: Family Medicine

## 2018-12-31 VITALS — BP 122/76 | HR 97 | Temp 98.4°F | Resp 16 | Ht 63.0 in | Wt 142.5 lb

## 2018-12-31 DIAGNOSIS — M25551 Pain in right hip: Secondary | ICD-10-CM | POA: Diagnosis not present

## 2018-12-31 DIAGNOSIS — J302 Other seasonal allergic rhinitis: Secondary | ICD-10-CM | POA: Diagnosis not present

## 2018-12-31 MED ORDER — NAPROXEN 500 MG PO TABS: 500.0000 mg | ORAL_TABLET | Freq: Two times a day (BID) | ORAL | 0 refills | Status: DC

## 2018-12-31 MED ORDER — MONTELUKAST SODIUM 10 MG PO TABS: 10.0000 mg | ORAL_TABLET | Freq: Every day | ORAL | 3 refills | Status: DC

## 2018-12-31 MED ORDER — LEVOCETIRIZINE DIHYDROCHLORIDE 5 MG PO TABS: 5.0000 mg | ORAL_TABLET | Freq: Every evening | ORAL | 1 refills | Status: DC

## 2018-12-31 MED ORDER — AZELASTINE HCL 137 MCG/SPRAY NA SOLN: 1.0000 | Freq: Every day | NASAL | 2 refills | Status: DC

## 2018-12-31 NOTE — Progress Notes (Signed)
Established Patient Office Visit  Subjective:  Patient ID: Makayla Mason, female    DOB: [birth year]  Age: 53 y.o. MRN: [id number]  CC:  Chief Complaint  Patient presents with  . Allergic Rhinitis     nasal draiange yellow phelgm    HPI Makayla Mason presents for post nasal drainage with yellow phlegm and some nasal congestion x1 week. She denies cough, sore throat, runny nose, ear pain, fevers, chills or body aches. She reports some nasal stuffiness and feeling like she has a lot of drainage in the back of her throat. She reports she is still taking her xyzal, singulair and nasocort but thinks she may need something stronger for her symptoms. She currently sees ENT where she receives allergy shots, her last shot was Monday.  Patient also reports some R upper leg pain that is ongoing, she has a known history of arthritis which she has seen ortho for. She is currently not taking any medication for her pain but reports that it wakes her up sometimes at night.   Past Medical History:  Diagnosis Date  . Allergic rhinitis, seasonal   . Mild scoliosis   . TMJ arthralgia    right side    Past Surgical History:  Procedure Laterality Date  . COLONOSCOPY WITH PROPOFOL N/A 02/15/2018   Procedure: COLONOSCOPY WITH PROPOFOL;  Surgeon: Jonathon Bellows, MD;  Location: [facility] ENDOSCOPY;  Service: Gastroenterology;  Laterality: N/A;    Family History  Problem Relation Age of Onset  . Breast cancer Cousin   . Diabetes Mother   . Diabetes Father   . Hypertension Father   . Asthma Daughter   . Anemia Maternal Aunt   . Stroke Maternal Aunt   . Heart disease Maternal Grandmother   . Kidney disease Maternal Grandmother   . Hypertension Maternal Grandmother     Social History   Socioeconomic History  . Marital status: Divorced    Spouse name: Not on file  . Number of children: 2  . Years of education: Not on file  . Highest education level: Bachelor's degree (e.g., BA, AB, BS)  Occupational  History  . Not on file  Social Needs  . Financial resource strain: Not hard at all  . Food insecurity:    Worry: Never true    Inability: Never true  . Transportation needs:    Medical: No    Non-medical: No  Tobacco Use  . Smoking status: Never Smoker  . Smokeless tobacco: Never Used  Substance and Sexual Activity  . Alcohol use: Yes    Alcohol/week: 0.0 standard drinks    Comment: occasional  . Drug use: No  . Sexual activity: Not Currently    Birth control/protection: Post-menopausal  Lifestyle  . Physical activity:    Days per week: Not on file    Minutes per session: Not on file  . Stress: Not at all  Relationships  . Social connections:    Talks on phone: Not on file    Gets together: Not on file    Attends religious service: Not on file    Active member of club or organization: Not on file    Attends meetings of clubs or organizations: Not on file    Relationship status: Not on file  . Intimate partner violence:    Fear of current or ex partner: No    Emotionally abused: No    Physically abused: No    Forced sexual activity: No  Other Topics Concern  .  Not on file  Social History Narrative   Lives in [place], grown daughters and grandson are at home    Outpatient Medications Prior to Visit  Medication Sig Dispense Refill  . AUVI-Q 0.3 MG/0.3ML SOAJ injection INJECT AS NEEDED FOR SEVERE ALLERGIC REACTION INCLUDING ANAPHYLAXIS AS DIRECTED  1  . omeprazole (PRILOSEC) 40 MG capsule Take 1 capsule (40 mg total) by mouth daily. 90 capsule 1  . triamcinolone (NASACORT ALLERGY 24HR) 55 MCG/ACT AERO nasal inhaler Place 1 spray into the nose daily. 16.9 mL 3  . levocetirizine (XYZAL) 5 MG tablet Take 1 tablet (5 mg total) by mouth every evening. 30 tablet 3  . montelukast (SINGULAIR) 10 MG tablet Take 1 tablet (10 mg total) by mouth at bedtime. 30 tablet 3  . Prasterone (INTRAROSA) 6.5 MG INST Place 1 tablet vaginally at bedtime. (Patient not taking: Reported on  12/31/2018) 30 each 11  . promethazine-dextromethorphan (PROMETHAZINE-DM) 6.25-15 MG/5ML syrup Take 5 mLs by mouth 4 (four) times daily as needed for cough. (Patient not taking: Reported on 12/31/2018) 118 mL 0   No facility-administered medications prior to visit.     Allergies  Allergen Reactions  . Codeine Nausea And Vomiting    ROS Review of Systems  Constitutional: Negative.   HENT: Positive for postnasal drip and sinus pressure. Negative for ear pain, rhinorrhea, sinus pain, sneezing and sore throat.   Eyes: Negative.   Respiratory: Negative for cough and wheezing.   Cardiovascular: Negative for chest pain.  Gastrointestinal: Negative.   Endocrine: Negative.   Genitourinary: Negative.   Musculoskeletal: Positive for arthralgias.  Allergic/Immunologic: Negative.   Neurological: Negative.   Hematological: Negative.   Psychiatric/Behavioral: Negative.       Objective:    Physical Exam  Constitutional: She is oriented to person, place, and time. She appears well-developed and well-nourished.  HENT:  Head: Normocephalic.  Right Ear: Tympanic membrane and external ear normal.  Left Ear: Tympanic membrane and external ear normal.  Nose: Nose normal.  Mouth/Throat: Uvula is midline, oropharynx is clear and moist and mucous membranes are normal.  Eyes: Pupils are equal, round, and reactive to light. Right eye exhibits no discharge. Left eye exhibits no discharge.  Neck: Normal range of motion. Neck supple.  Cardiovascular: Normal rate, regular rhythm, normal heart sounds and intact distal pulses.  Pulmonary/Chest: Effort normal and breath sounds normal. No respiratory distress. She has no wheezes.  Abdominal: Soft. Bowel sounds are normal.  Musculoskeletal: Normal range of motion.  Neurological: She is alert and oriented to person, place, and time. She has normal reflexes.  Skin: Skin is warm and dry.  Psychiatric: She has a normal mood and affect. Her behavior is normal.  Judgment and thought content normal.  Nursing note and vitals reviewed.   BP 122/76 (BP Location: Right Arm, Patient Position: Sitting, Cuff Size: Large)   Pulse 97   Temp 98.4 F (36.9 C) (Oral)   Resp 16   Ht 5\' 3"  (1.6 m)   Wt 142 lb 8 oz (64.6 kg)   SpO2 97%   BMI 25.24 kg/m  Wt Readings from Last 3 Encounters:  12/31/18 142 lb 8 oz (64.6 kg)  12/04/18 145 lb 6.4 oz (66 kg)  11/22/18 147 lb 6.4 oz (66.9 kg)     There are no preventive care reminders to display for this patient.  There are no preventive care reminders to display for this patient.  No results found for: TSH Lab Results  Component Value Date  WBC 8.3 02/01/2017   HGB 14.1 02/01/2017   HCT 43.7 02/01/2017   MCV 89.0 02/01/2017   PLT 358 02/01/2017   Lab Results  Component Value Date   NA 143 12/04/2018   K 5.2 12/04/2018   CO2 29 12/04/2018   GLUCOSE 82 12/04/2018   BUN 16 12/04/2018   CREATININE 0.63 12/04/2018   BILITOT 0.5 12/04/2018   ALKPHOS 88 02/01/2017   AST 12 12/04/2018   ALT 10 12/04/2018   PROT 7.2 12/04/2018   ALBUMIN 4.1 02/01/2017   CALCIUM 10.1 12/04/2018   Lab Results  Component Value Date   CHOL 181 12/04/2018   Lab Results  Component Value Date   HDL 53 12/04/2018   Lab Results  Component Value Date   LDLCALC 107 (H) 12/04/2018   Lab Results  Component Value Date   TRIG 114 12/04/2018   Lab Results  Component Value Date   CHOLHDL 3.4 12/04/2018   Lab Results  Component Value Date   HGBA1C 6.0 (H) 12/04/2018      Assessment & Plan:  Allergic Rhinitis: continue xyzal, singulair and nasocort as prescribed. Will add azelastaine nasal spray daily. Need to follow up with ENT for further management of symptoms.  Arthritis of Right leg: discussed weight bearing and strengthening exercise as well as ice for pain along with naproxen. [facility] for further management of symptoms if pain is not improving.   Problem List Items Addressed This  Visit      Respiratory   Allergic rhinitis, seasonal - Primary   Relevant Medications   Azelastine HCl 137 MCG/SPRAY SOLN   montelukast (SINGULAIR) 10 MG tablet   levocetirizine (XYZAL) 5 MG tablet     Other   Right hip pain   Relevant Medications   naproxen (NAPROSYN) 500 MG tablet      Meds ordered this encounter  Medications  . Azelastine HCl 137 MCG/SPRAY SOLN    Sig: Place 1 spray into the nose daily.    Dispense:  30 mL    Refill:  2    Order Specific Question:   Supervising Provider    Answer:   Steele Sizer [[id number]]  . montelukast (SINGULAIR) 10 MG tablet    Sig: Take 1 tablet (10 mg total) by mouth at bedtime.    Dispense:  90 tablet    Refill:  3    Order Specific Question:   Supervising Provider    Answer:   Steele Sizer [[id number]]  . levocetirizine (XYZAL) 5 MG tablet    Sig: Take 1 tablet (5 mg total) by mouth every evening.    Dispense:  30 tablet    Refill:  1    Order Specific Question:   Supervising Provider    Answer:   Steele Sizer [[id number]]  . naproxen (NAPROSYN) 500 MG tablet    Sig: Take 1 tablet (500 mg total) by mouth 2 (two) times daily with a meal.    Dispense:  30 tablet    Refill:  0    Order Specific Question:   Supervising Provider    Answer:   Steele Sizer [[id number]]    Follow-up: No follow-ups on file.    Hubbard Hartshorn, FNP

## 2018-12-31 NOTE — Patient Instructions (Addendum)
-   Please call your allergist to schedule a follow up appointment. - You may use [facility] from 1p-7p Mon-Fri for your hip pain if not improving with Naproxen and Ice.

## 2019-01-02 ENCOUNTER — Telehealth: Payer: Self-pay | Admitting: Family Medicine

## 2019-01-02 ENCOUNTER — Other Ambulatory Visit: Payer: Self-pay | Admitting: Family Medicine

## 2019-01-02 DIAGNOSIS — Z1239 Encounter for other screening for malignant neoplasm of breast: Secondary | ICD-10-CM

## 2019-01-02 NOTE — Telephone Encounter (Signed)
Copied from [place] [id number]. Topic: Quick Communication - See Telephone Encounter >> Jan 02, 2019  2:20 PM Bea Graff, NT wrote: CRM for notification. See Telephone encounter for: 01/02/19. Pt would like an order to have a mammogram done in April at the [facility]. Please advise.

## 2019-01-06 DIAGNOSIS — J301 Allergic rhinitis due to pollen: Secondary | ICD-10-CM | POA: Diagnosis not present

## 2019-01-10 DIAGNOSIS — J3489 Other specified disorders of nose and nasal sinuses: Secondary | ICD-10-CM | POA: Diagnosis not present

## 2019-01-10 DIAGNOSIS — J301 Allergic rhinitis due to pollen: Secondary | ICD-10-CM | POA: Diagnosis not present

## 2019-01-10 DIAGNOSIS — R0982 Postnasal drip: Secondary | ICD-10-CM | POA: Diagnosis not present

## 2019-01-13 DIAGNOSIS — J301 Allergic rhinitis due to pollen: Secondary | ICD-10-CM | POA: Diagnosis not present

## 2019-01-20 ENCOUNTER — Encounter: Payer: Self-pay | Admitting: Nurse Practitioner

## 2019-01-20 ENCOUNTER — Other Ambulatory Visit: Payer: Self-pay

## 2019-01-20 ENCOUNTER — Ambulatory Visit (INDEPENDENT_AMBULATORY_CARE_PROVIDER_SITE_OTHER): Payer: 59 | Admitting: Nurse Practitioner

## 2019-01-20 VITALS — BP 120/76 | HR 88 | Temp 98.0°F | Resp 14 | Ht 62.0 in | Wt 146.3 lb

## 2019-01-20 DIAGNOSIS — J301 Allergic rhinitis due to pollen: Secondary | ICD-10-CM | POA: Diagnosis not present

## 2019-01-20 DIAGNOSIS — J302 Other seasonal allergic rhinitis: Secondary | ICD-10-CM | POA: Diagnosis not present

## 2019-01-20 NOTE — Progress Notes (Signed)
Name: Makayla Mason   MRN: [id number]    DOB: 12-20-1965   Date:01/20/2019       Progress Note  Subjective  Chief Complaint  Chief Complaint  Patient presents with  . Allergies    allergy shot today, pt states has allergies and wants to be checked over    HPI  Went to work on Saturday and states allergies were acting up started to sneezing and had dry cough. Was sent home due to concern of COVID19 states could not return to work without note. Patient took singulair, nasal spray, xyzal with relief of symptoms.   Denies shortness of breath, fevers, chills. No positive COVID19 contacts.   PHQ2/9: Depression screen [facility] 2/9 01/20/2019 12/31/2018 11/22/2018 11/07/2018 09/13/2018  Decreased Interest 0 0 0 0 0  Down, Depressed, Hopeless 0 0 0 0 0  PHQ - 2 Score 0 0 0 0 0  Altered sleeping 0 0 0 0 0  Tired, decreased energy 0 0 0 0 0  Change in appetite 0 0 0 0 0  Feeling bad or failure about yourself  0 0 0 0 0  Trouble concentrating 0 0 0 0 0  Moving slowly or fidgety/restless 0 0 0 0 0  Suicidal thoughts 0 0 0 0 0  PHQ-9 Score 0 0 0 0 0  Difficult doing work/chores Not difficult at all Not difficult at all Not difficult at all Not difficult at all Not difficult at all     PHQ reviewed. Negative  Patient Active Problem List   Diagnosis Date Noted  . Cervical radicular pain 07/15/2018  . Overweight (BMI 25.0-29.9) 01/29/2018  . Right hip pain 01/29/2018  . Insulin resistance 02/28/2017  . Low grade squamous intraepith lesion on cytologic smear cervix (lgsil) 02/28/2017  . GERD without esophagitis 03/15/2016  . Left ankle pain 03/15/2016  . Allergic rhinitis, seasonal 05/21/2015  . Scoliosis 05/21/2015  . 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 Allergic rhinitis, seasonal   . Mild scoliosis   . TMJ arthralgia    right side    Past Surgical History:  Procedure Laterality Date  . COLONOSCOPY WITH PROPOFOL N/A 02/15/2018    Procedure: COLONOSCOPY WITH PROPOFOL;  Surgeon: Jonathon Bellows, MD;  Location: [facility] ENDOSCOPY;  Service: Gastroenterology;  Laterality: N/A;    Social History   Tobacco Use  . Smoking status: Never Smoker  . Smokeless tobacco: Never Used  Substance Use Topics  . Alcohol use: Yes    Alcohol/week: 0.0 standard drinks    Comment: occasional     Current Outpatient Medications:  .  AUVI-Q 0.3 MG/0.3ML SOAJ injection, INJECT AS NEEDED FOR SEVERE ALLERGIC REACTION INCLUDING ANAPHYLAXIS AS DIRECTED, Disp: , Rfl: 1 .  Azelastine HCl 137 MCG/SPRAY SOLN, Place 1 spray into the nose daily., Disp: 30 mL, Rfl: 2 .  levocetirizine (XYZAL) 5 MG tablet, Take 1 tablet (5 mg total) by mouth every evening., Disp: 30 tablet, Rfl: 1 .  montelukast (SINGULAIR) 10 MG tablet, Take 1 tablet (10 mg total) by mouth at bedtime., Disp: 90 tablet, Rfl: 3 .  naproxen (NAPROSYN) 500 MG tablet, Take 1 tablet (500 mg total) by mouth 2 (two) times daily with a meal., Disp: 30 tablet, Rfl: 0 .  omeprazole (PRILOSEC) 40 MG capsule, Take 1 capsule (40 mg total) by mouth daily., Disp: 90 capsule, Rfl: 1 .  triamcinolone (NASACORT ALLERGY 24HR) 55 MCG/ACT AERO nasal inhaler, Place 1 spray into  the nose daily. (Patient not taking: Reported on 01/20/2019), Disp: 16.9 mL, Rfl: 3  Allergies  Allergen Reactions  . Codeine Nausea And Vomiting    ROS   No other specific complaints in a complete review of systems (except as listed in HPI above).  Objective  Vitals:   01/20/19 1455  BP: 120/76  Pulse: 88  Resp: 14  Temp: 98 F (36.7 C)  TempSrc: Oral  SpO2: 98%  Weight: 146 lb 4.8 oz (66.4 kg)  Height: 5\' 2"  (1.575 m)     Body mass index is 26.76 kg/m.  Nursing Note and Vital Signs reviewed.  Physical Exam Constitutional:      Appearance: Normal appearance.  HENT:     Head: Normocephalic and atraumatic.     Nose: No congestion or rhinorrhea.     Mouth/Throat:     Pharynx: No oropharyngeal exudate or  posterior oropharyngeal erythema.  Eyes:     Conjunctiva/sclera: Conjunctivae normal.  Cardiovascular:     Rate and Rhythm: Normal rate and regular rhythm.  Pulmonary:     Effort: Pulmonary effort is normal.     Breath sounds: Normal breath sounds.  Skin:    General: Skin is warm and dry.  Neurological:     General: No focal deficit present.     Mental Status: She is alert and oriented to person, place, and time.  Psychiatric:        Mood and Affect: Mood normal.        Behavior: Behavior normal.       No results found for this or any previous visit (from the past 48 hour(s)).  Assessment & Plan 1. Seasonal allergies Continue allergy shots and OTC medications. Discussed prevention of worsening symptoms and infections.

## 2019-01-27 ENCOUNTER — Other Ambulatory Visit: Payer: Self-pay | Admitting: Family Medicine

## 2019-01-27 DIAGNOSIS — Z1231 Encounter for screening mammogram for malignant neoplasm of breast: Secondary | ICD-10-CM

## 2019-01-31 ENCOUNTER — Encounter: Payer: 59 | Admitting: Family Medicine

## 2019-02-11 ENCOUNTER — Encounter: Payer: 59 | Admitting: Obstetrics and Gynecology

## 2019-02-27 DIAGNOSIS — J301 Allergic rhinitis due to pollen: Secondary | ICD-10-CM | POA: Diagnosis not present

## 2019-03-04 ENCOUNTER — Encounter: Payer: 59 | Admitting: Family Medicine

## 2019-03-06 DIAGNOSIS — J301 Allergic rhinitis due to pollen: Secondary | ICD-10-CM | POA: Diagnosis not present

## 2019-03-13 DIAGNOSIS — J301 Allergic rhinitis due to pollen: Secondary | ICD-10-CM | POA: Diagnosis not present

## 2019-03-20 DIAGNOSIS — J301 Allergic rhinitis due to pollen: Secondary | ICD-10-CM | POA: Diagnosis not present

## 2019-03-27 DIAGNOSIS — J301 Allergic rhinitis due to pollen: Secondary | ICD-10-CM | POA: Diagnosis not present

## 2019-04-03 DIAGNOSIS — J301 Allergic rhinitis due to pollen: Secondary | ICD-10-CM | POA: Diagnosis not present

## 2019-04-17 DIAGNOSIS — J301 Allergic rhinitis due to pollen: Secondary | ICD-10-CM | POA: Diagnosis not present

## 2019-04-18 ENCOUNTER — Encounter: Payer: 59 | Admitting: Obstetrics and Gynecology

## 2019-04-23 DIAGNOSIS — H5203 Hypermetropia, bilateral: Secondary | ICD-10-CM | POA: Diagnosis not present

## 2019-04-24 DIAGNOSIS — J301 Allergic rhinitis due to pollen: Secondary | ICD-10-CM | POA: Diagnosis not present

## 2019-05-01 DIAGNOSIS — J301 Allergic rhinitis due to pollen: Secondary | ICD-10-CM | POA: Diagnosis not present

## 2019-05-05 ENCOUNTER — Ambulatory Visit (INDEPENDENT_AMBULATORY_CARE_PROVIDER_SITE_OTHER): Payer: 59 | Admitting: Family Medicine

## 2019-05-05 ENCOUNTER — Encounter: Payer: Self-pay | Admitting: Family Medicine

## 2019-05-05 ENCOUNTER — Other Ambulatory Visit: Payer: Self-pay

## 2019-05-05 VITALS — BP 126/78 | HR 90 | Temp 97.8°F | Resp 14 | Ht 62.0 in | Wt 145.1 lb

## 2019-05-05 DIAGNOSIS — J302 Other seasonal allergic rhinitis: Secondary | ICD-10-CM

## 2019-05-05 DIAGNOSIS — Z0000 Encounter for general adult medical examination without abnormal findings: Secondary | ICD-10-CM

## 2019-05-05 DIAGNOSIS — R102 Pelvic and perineal pain: Secondary | ICD-10-CM

## 2019-05-05 DIAGNOSIS — K120 Recurrent oral aphthae: Secondary | ICD-10-CM

## 2019-05-05 MED ORDER — TRIAMCINOLONE ACETONIDE 55 MCG/ACT NA AERO: 1.0000 | INHALATION_SPRAY | Freq: Every day | NASAL | 3 refills | Status: DC

## 2019-05-05 MED ORDER — LEVOCETIRIZINE DIHYDROCHLORIDE 5 MG PO TABS: 5.0000 mg | ORAL_TABLET | Freq: Every evening | ORAL | 1 refills | Status: DC

## 2019-05-05 MED ORDER — LIDOCAINE VISCOUS HCL 2 % MT SOLN: 15.0000 mL | Freq: Four times a day (QID) | OROMUCOSAL | 0 refills | Status: DC | PRN

## 2019-05-05 NOTE — Addendum Note (Signed)
Addended by: Saunders Glance A on: 05/05/2019 11:53 AM   Modules accepted: Orders

## 2019-05-05 NOTE — Addendum Note (Signed)
Addended by: Hubbard Hartshorn on: 05/05/2019 10:41 AM   Modules accepted: Orders

## 2019-05-05 NOTE — Patient Instructions (Addendum)
[email or web address] therapist finder  Preventive Care 6-55 Years Old, Female Preventive care refers to visits with your health care provider and lifestyle choices that can promote health and wellness. This includes:  A yearly physical exam. This may also be called an annual well check.  Regular dental visits and eye exams.  Immunizations.  Screening for certain conditions.  Healthy lifestyle choices, such as eating a healthy diet, getting regular exercise, not using drugs or products that contain nicotine and tobacco, and limiting alcohol use. What can I expect for my preventive care visit? Physical exam Your health care provider will check your:  Height and weight. This may be used to calculate body mass index (BMI), which tells if you are at a healthy weight.  Heart rate and blood pressure.  Skin for abnormal spots. Counseling Your health care provider may ask you questions about your:  Alcohol, tobacco, and drug use.  Emotional well-being.  Home and relationship well-being.  Sexual activity.  Eating habits.  Work and work Statistician.  Method of birth control.  Menstrual cycle.  Pregnancy history. What immunizations do I need?  Influenza (flu) vaccine  This is recommended every year. Tetanus, diphtheria, and pertussis (Tdap) vaccine  You may need a Td booster every 10 years. Varicella (chickenpox) vaccine  You may need this if you have not been vaccinated. Zoster (shingles) vaccine  You may need this after age 77. Measles, mumps, and rubella (MMR) vaccine  You may need at least one dose of MMR if you were born in 1957 or later. You may also need a second dose. Pneumococcal conjugate (PCV13) vaccine  You may need this if you have certain conditions and were not previously vaccinated. Pneumococcal polysaccharide (PPSV23) vaccine  You may need one or two doses if you smoke cigarettes or if you have certain  conditions. Meningococcal conjugate (MenACWY) vaccine  You may need this if you have certain conditions. Hepatitis A vaccine  You may need this if you have certain conditions or if you travel or work in places where you may be exposed to hepatitis A. Hepatitis B vaccine  You may need this if you have certain conditions or if you travel or work in places where you may be exposed to hepatitis B. Haemophilus influenzae type b (Hib) vaccine  You may need this if you have certain conditions. Human papillomavirus (HPV) vaccine  If recommended by your health care provider, you may need three doses over 6 months. You may receive vaccines as individual doses or as more than one vaccine together in one shot (combination vaccines). Talk with your health care provider about the risks and benefits of combination vaccines. What tests do I need? Blood tests  Lipid and cholesterol levels. These may be checked every 5 years, or more frequently if you are over 39 years old.  Hepatitis C test.  Hepatitis B test. Screening  Lung cancer screening. You may have this screening every year starting at age 49 if you have a 30-pack-year history of smoking and currently smoke or have quit within the past 15 years.  Colorectal cancer screening. All adults should have this screening starting at age 55 and continuing until age 42. Your health care provider may recommend screening at age 23 if you are at increased risk. You will have tests every 1-10 years, depending on your results and the type of screening test.  Diabetes screening. This is done by checking your blood sugar (glucose) after you have not eaten for  a while (fasting). You may have this done every 1-3 years.  Mammogram. This may be done every 1-2 years. Talk with your health care provider about when you should start having regular mammograms. This may depend on whether you have a family history of breast cancer.  BRCA-related cancer screening. This  may be done if you have a family history of breast, ovarian, tubal, or peritoneal cancers.  Pelvic exam and Pap test. This may be done every 3 years starting at age 75. Starting at age 34, this may be done every 5 years if you have a Pap test in combination with an HPV test. Other tests  Sexually transmitted disease (STD) testing.  Bone density scan. This is done to screen for osteoporosis. You may have this scan if you are at high risk for osteoporosis. Follow these instructions at home: Eating and drinking  Eat a diet that includes fresh fruits and vegetables, whole grains, lean protein, and low-fat dairy.  Take vitamin and mineral supplements as recommended by your health care provider.  Do not drink alcohol if: ? Your health care provider tells you not to drink. ? You are pregnant, may be pregnant, or are planning to become pregnant.  If you drink alcohol: ? Limit how much you have to 0-1 drink a day. ? Be aware of how much alcohol is in your drink. In the [place] one drink equals one 12 oz bottle of beer (355 mL), one 5 oz glass of wine (148 mL), or one 1 oz glass of hard liquor (44 mL). Lifestyle  Take daily care of your teeth and gums.  Stay active. Exercise for at least 30 minutes on 5 or more days each week.  Do not use any products that contain nicotine or tobacco, such as cigarettes, e-cigarettes, and chewing tobacco. If you need help quitting, ask your health care provider.  If you are sexually active, practice safe sex. Use a condom or other form of birth control (contraception) in order to prevent pregnancy and STIs (sexually transmitted infections).  If told by your health care provider, take low-dose aspirin daily starting at age 14. What's next?  Visit your health care provider once a year for a well check visit.  Ask your health care provider how often you should have your eyes and teeth checked.  Stay up to date on all vaccines. This information is not  intended to replace advice given to you by your health care provider. Make sure you discuss any questions you have with your health care provider. Document Released: 11/19/2015 Document Revised: 07/04/2018 Document Reviewed: 07/04/2018 Elsevier Patient Education  2020 [place].   Chronic Ankle Instability Rehab Ask your health care provider which exercises are safe for you. Do exercises exactly as told by your health care provider and adjust them as directed. It is normal to feel mild stretching, pulling, tightness, or discomfort as you do these exercises. Stop right away if you feel sudden pain or your pain gets worse. Do not begin these exercises until told by your health care provider. Strengthening exercises These exercises build strength and endurance in your ankle. Endurance is the ability to use your muscles for a long time, even after they get tired. Ankle eversion 1. Sit on the floor with your legs straight out in front of you. 2. Loop a rubber exercise band around the ball of your left / right foot. The ball of your foot is on the walking surface, right under your toes. Hold the  ends of the band in your hands, or secure the band to a stable object. 3. Slowly push your foot outward, away from your other leg (eversion). 4. Hold this position for __________ seconds. 5. Slowly return your foot to the starting position. Repeat __________ times. Complete this exercise __________ times a day. Heel walking  This exercise strengthens the muscles that push the ankle backward to point your toes toward your knee (ankle dorsiflexors). Walk on your heels for __________. Keep your toes as high as possible. Repeat __________ times. Complete this exercise __________ times per day. Toe walking  This exercise strengthens the muscles that push the ankle forward and your toes downward (ankle plantar flexors). Walk on your toes for __________. Keep your heels as high as possible. Repeat __________  times. Complete this exercise __________ times per day. Balance exercises These exercises improve or maintain your balance. Balance is important in improving ankle stability and preventing falls. Tandem walking Do this exercise in a hallway or room that is at least 10 ft (3 m) long. 1. Stand with one foot directly in front of the other (tandem). You can use the walls to help you balance if needed, but try not to use them for support. 2. Slowly lift your back foot and place it directly in front of your other foot. 3. Continue to walk in this heel-to-toe way for __________. Repeat __________ times. Complete this exercise __________ times a day. Single leg stand 1. Without shoes, stand near a railing or in a doorway. You may hold on to the railing or door frame as needed for balance. 2. Stand on your left / right foot. Keep your big toe down on the floor and try to keep your arch lifted. If this is too easy, you can try doing it while you do one of these actions: ? Keep your eyes closed. ? Stand on a pillow. ? Throw a ball against a wall and catch it when it returns. 3. Hold this position for __________ seconds. Repeat __________ times. Complete this exercise __________ times a day. Ankle inversion and ankle eversion This exercise is also called foot rotation with a balance board. It uses a balance board to rotate the foot and ankle inward (inversion) and outward (eversion). Ask your health care provider where you can get a balance board or how you can make one. 1. Stand on a non-carpeted surface near a countertop or wall. 2. Step onto the balance board so your feet are hip width apart. 3. Keep your feet in place, and keep your upper body and hips steady. 4. Using only your feet and ankles to move the board, do the following exercises: ? Tip the board from side to side as far as you can, alternating between tipping to the left and tipping to the right. ? Tip the board so it silently taps the  floor. Do not let the board forcefully hit the floor. ? From time to time, pause to hold a steady midway position, with neither the right nor the left sides touching the ground. ? Tip the board side to side so the board does not hit the floor at all. From time to time, pause to hold a steady midway position. Repeat __________ times, pausing from time to time to hold a steady position. Complete this exercise __________ times a day. Ankle plantar flexion and ankle dorsiflexion This exercise is also called foot flexion with a balance board. It uses a balance board to push the foot downward  and away from the leg (plantar flexion) or upward and toward the leg (dorsiflexion). Ask your health care provider where you can get a balance board or how you can make one. 1. Stand on a non-carpeted surface near a countertop or wall. 2. Step onto the balance board so your feet are hip width apart. 3. Keep your feet in place, and keep your upper body and hips steady. 4. Using only your feet and ankles, do the following exercises: ? Tip the board forward and backward so the board silently taps the floor. Do not let the board forcefully hit the floor. ? From time to time, pause to hold a steady position midway between touching the floor in front and touching the floor in back. ? Tip the board forward and backward so the board does not hit the floor at all. From time to time, pause to hold a steady position. Repeat __________ times, pausing from time to time to hold a steady position. Complete this exercise __________ times a day. This information is not intended to replace advice given to you by your health care provider. Make sure you discuss any questions you have with your health care provider. Document Released: 05/24/2005 Document Revised: 02/13/2019 Document Reviewed: 08/06/2018 Elsevier Patient Education  2020 [employer].

## 2019-05-05 NOTE — Progress Notes (Signed)
Name: Makayla Mason   MRN: [id number]    DOB: [birth year]   Date:05/05/2019       Progress Note  Subjective  Chief Complaint  Chief Complaint  Patient presents with  . Annual Exam    see's GYN    HPI  Patient presents for annual CPE.  Diet: She is eating out sometimes, but also cooks at home.  Needs to increase fruits and vegetables. Exercise: She exercises sometimes - walks around her neighborhood.   USPSTF grade A and B recommendations    Office Visit from 05/05/2019 in [facility]  AUDIT-C Score  1     Depression: Phq 9 is  negative Depression screen [facility] 2/9 05/05/2019 01/20/2019 12/31/2018 11/22/2018 11/07/2018  Decreased Interest 0 0 0 0 0  Down, Depressed, Hopeless 0 0 0 0 0  PHQ - 2 Score 0 0 0 0 0  Altered sleeping 0 0 0 0 0  Tired, decreased energy 0 0 0 0 0  Change in appetite 0 0 0 0 0  Feeling bad or failure about yourself  0 0 0 0 0  Trouble concentrating 0 0 0 0 0  Moving slowly or fidgety/restless 0 0 0 0 0  Suicidal thoughts 0 0 0 0 0  PHQ-9 Score 0 0 0 0 0  Difficult doing work/chores Not difficult at all Not difficult at all Not difficult at all Not difficult at all Not difficult at all   Hypertension: BP Readings from Last 3 Encounters:  05/05/19 126/78  01/20/19 120/76  12/31/18 122/76   Obesity: Wt Readings from Last 3 Encounters:  05/05/19 145 lb 1.6 oz (65.8 kg)  01/20/19 146 lb 4.8 oz (66.4 kg)  12/31/18 142 lb 8 oz (64.6 kg)   BMI Readings from Last 3 Encounters:  05/05/19 26.54 kg/m  01/20/19 26.76 kg/m  12/31/18 25.24 kg/m    Hep C Screening: Negative 2017 STD testing and prevention (HIV/chl/gon/syphilis): Negative 2018 Intimate partner violence: No concerns Sexual History/Pain during Intercourse: Not having intercourse, but does note some LEFT lower pelvic pain around the time that her daughters have their periods. She would like this looked into. Menstrual History/LMP/Abnormal Bleeding: She is post-menopausal, no  hysterectomy.   Incontinence Symptoms: No concerns   Advanced Care Planning: A voluntary discussion about advance care planning including the explanation and discussion of advance directives.  Discussed health care proxy and Living will, and the patient was able to identify a health care proxy as Daughter Education administrator and Barrister's clerk).  Patient does not have a living will at present time. If patient does have living will, I have requested they bring this to the clinic to be scanned in to their chart.  Breast cancer: Scheduled for next week. No results found for: HMMAMMO  BRCA gene screening: Maternal Cousin with history, no one else. Cervical cancer screening: UTD, due in 2022.  Osteoporosis Screening: No family history, no personal history of low vitamin D. No results found for: HMDEXASCAN  Lipids: UTD Lab Results  Component Value Date   CHOL 181 12/04/2018   CHOL 164 01/29/2018   CHOL 168 02/01/2017   Lab Results  Component Value Date   HDL 53 12/04/2018   HDL 51 01/29/2018   HDL 55 02/01/2017   Lab Results  Component Value Date   LDLCALC 107 (H) 12/04/2018   LDLCALC 88 01/29/2018   LDLCALC 94 02/01/2017   Lab Results  Component Value Date   TRIG 114 12/04/2018   TRIG  145 01/29/2018   TRIG 94 02/01/2017   Lab Results  Component Value Date   CHOLHDL 3.4 12/04/2018   CHOLHDL 3.2 01/29/2018   CHOLHDL 3.1 02/01/2017   No results found for: LDLDIRECT  Glucose: Stable in January 2020 Glucose, Bld  Date Value Ref Range Status  12/04/2018 82 65 - 99 mg/dL Final    Comment:    .            Fasting reference interval .   01/29/2018 75 65 - 99 mg/dL Final    Comment:    .            Fasting reference interval .   02/01/2017 97 65 - 99 mg/dL Final    Skin cancer: No concerns Colorectal cancer: UTD - due in 2029 Lung cancer:  Low Dose CT Chest recommended if Age 81-80 years, 30 pack-year currently smoking OR have quit w/in 15years. Patient does not  qualify. ECG: No chest pain, shortness of breath, palpitations.  None on file.  Patient Active Problem List   Diagnosis Date Noted  . Cervical radicular pain 07/15/2018  . Overweight (BMI 25.0-29.9) 01/29/2018  . Right hip pain 01/29/2018  . Insulin resistance 02/28/2017  . Low grade squamous intraepith lesion on cytologic smear cervix (lgsil) 02/28/2017  . GERD without esophagitis 03/15/2016  . Left ankle pain 03/15/2016  . Allergic rhinitis, seasonal 05/21/2015  . Scoliosis 05/21/2015  . 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 COLONOSCOPY WITH PROPOFOL N/A 02/15/2018   Procedure: COLONOSCOPY WITH PROPOFOL;  Surgeon: Jonathon Bellows, MD;  Location: [facility] ENDOSCOPY;  Service: Gastroenterology;  Laterality: N/A;    Family History  Problem Relation Age of Onset  . Breast cancer Cousin   . Diabetes Mother   . Diabetes Father   . Hypertension Father   . Asthma Daughter   . Anemia Maternal Aunt   . Stroke Maternal Aunt   . Heart disease Maternal Grandmother   . Kidney disease Maternal Grandmother   . Hypertension Maternal Grandmother     Social History   Socioeconomic History  . Marital status: Divorced    Spouse name: Not on file  . Number of children: 2  . Years of education: Not on file  . Highest education level: Bachelor's degree (e.g., BA, AB, BS)  Occupational History  . Not on file  Social Needs  . Financial resource strain: Not hard at all  . Food insecurity    Worry: Never true    Inability: Never true  . Transportation needs    Medical: No    Non-medical: No  Tobacco Use  . Smoking status: Never Smoker  . Smokeless tobacco: Never Used  Substance and Sexual Activity  . Alcohol use: Yes    Alcohol/week: 0.0 standard drinks    Comment: occasional  . Drug use: No  . Sexual activity: Not Currently    Birth control/protection: Post-menopausal  Lifestyle  . Physical activity    Days per week:  Not on file    Minutes per session: Not on file  . Stress: Not at all  Relationships  . Social Herbalist on phone: Not on file    Gets together: Not on file    Attends religious service: Not on file    Active member of club or organization: Not on file    Attends meetings of clubs or organizations: Not on file    Relationship status:  Not on file  . Intimate partner violence    Fear of current or ex partner: No    Emotionally abused: No    Physically abused: No    Forced sexual activity: No  Other Topics Concern  . Not on file  Social History Narrative   Lives in [place], grown daughters and grandson are at home     Current Outpatient Medications:  .  AUVI-Q 0.3 MG/0.3ML SOAJ injection, INJECT AS NEEDED FOR SEVERE ALLERGIC REACTION INCLUDING ANAPHYLAXIS AS DIRECTED, Disp: , Rfl: 1 .  levocetirizine (XYZAL) 5 MG tablet, Take 1 tablet (5 mg total) by mouth every evening., Disp: 30 tablet, Rfl: 1 .  montelukast (SINGULAIR) 10 MG tablet, Take 1 tablet (10 mg total) by mouth at bedtime., Disp: 90 tablet, Rfl: 3 .  naproxen (NAPROSYN) 500 MG tablet, Take 1 tablet (500 mg total) by mouth 2 (two) times daily with a meal., Disp: 30 tablet, Rfl: 0 .  omeprazole (PRILOSEC) 40 MG capsule, Take 1 capsule (40 mg total) by mouth daily., Disp: 90 capsule, Rfl: 1 .  Azelastine HCl 137 MCG/SPRAY SOLN, Place 1 spray into the nose daily. (Patient not taking: Reported on 05/05/2019), Disp: 30 mL, Rfl: 2 .  triamcinolone (NASACORT ALLERGY 24HR) 55 MCG/ACT AERO nasal inhaler, Place 1 spray into the nose daily. (Patient not taking: Reported on 01/20/2019), Disp: 16.9 mL, Rfl: 3  Allergies  Allergen Reactions  . Codeine Nausea And Vomiting   ROS  Constitutional: Negative for fever or weight change.  Respiratory: Negative for cough and shortness of breath.   Cardiovascular: Negative for chest pain or palpitations.  Gastrointestinal: Negative for abdominal pain, no bowel changes.   Musculoskeletal: Negative for gait problem or joint swelling.  Skin: Negative for rash.  Neurological: Negative for dizziness or headache.  No other specific complaints in a complete review of systems (except as listed in HPI above).  Objective  Vitals:   05/05/19 1015  BP: 126/78  Pulse: 90  Resp: 14  Temp: 97.8 F (36.6 C)  TempSrc: Oral  SpO2: 99%  Weight: 145 lb 1.6 oz (65.8 kg)  Height: 5' 2"  (1.575 m)    Body mass index is 26.54 kg/m.  Physical Exam  Constitutional: Patient appears well-developed and well-nourished. No distress.  HENT: Head: Normocephalic and atraumatic. Ears: B TMs ok, no erythema or effusion; Nose: Nose normal. Mouth/Throat: Oropharynx is clear and moist. No oropharyngeal exudate.  Eyes: Conjunctivae and EOM are normal. Pupils are equal, round, and reactive to light. No scleral icterus.  Neck: Normal range of motion. Neck supple. No JVD present. No thyromegaly present.  Cardiovascular: Normal rate, regular rhythm and normal heart sounds.  No murmur heard. No BLE edema. Pulmonary/Chest: Effort normal and breath sounds normal. No respiratory distress. Abdominal: Soft. Bowel sounds are normal, no distension. There is no tenderness. no masses Breast: no lumps or masses, no nipple discharge or rashes Musculoskeletal: Normal range of motion, no joint effusions. No gross deformities Neurological: he is alert and oriented to person, place, and time. No cranial nerve deficit. Coordination, balance, strength, speech and gait are normal.  Skin: Skin is warm and dry. No rash noted. No erythema.  Psychiatric: Patient has a normal mood and affect. behavior is normal. Judgment and thought content normal.  No results found for this or any previous visit (from the past 2160 hour(s)).  PHQ2/9: Depression screen [facility] 2/9 05/05/2019 01/20/2019 12/31/2018 11/22/2018 11/07/2018  Decreased Interest 0 0 0 0 0  Down, Depressed, Hopeless  0 0 0 0 0  PHQ - 2 Score 0 0 0 0 0   Altered sleeping 0 0 0 0 0  Tired, decreased energy 0 0 0 0 0  Change in appetite 0 0 0 0 0  Feeling bad or failure about yourself  0 0 0 0 0  Trouble concentrating 0 0 0 0 0  Moving slowly or fidgety/restless 0 0 0 0 0  Suicidal thoughts 0 0 0 0 0  PHQ-9 Score 0 0 0 0 0  Difficult doing work/chores Not difficult at all Not difficult at all Not difficult at all Not difficult at all Not difficult at all    Fall Risk: Fall Risk  05/05/2019 01/20/2019 12/31/2018 12/04/2018 11/22/2018  Falls in the past year? 1 0 0 0 0  Number falls in past yr: 0 0 0 0 0  Injury with Fall? 1 0 0 0 0  Comment - - - - -  Follow up - - Falls evaluation completed - Falls evaluation completed   Functional Status Survey: Is the patient deaf or have difficulty hearing?: No Does the patient have difficulty seeing, even when wearing glasses/contacts?: No Does the patient have difficulty concentrating, remembering, or making decisions?: No Does the patient have difficulty walking or climbing stairs?: No Does the patient have difficulty dressing or bathing?: No Does the patient have difficulty doing errands alone such as visiting a doctor's office or shopping?: No   Assessment & Plan  1. Well woman exam (no gynecological exam) -USPSTF grade A and B recommendations reviewed with patient; age-appropriate recommendations, preventive care, screening tests, etc discussed and encouraged; healthy living encouraged; see AVS for patient education given to patient -Discussed importance of 150 minutes of physical activity weekly, eat two servings of fish weekly, eat one serving of tree nuts ( cashews, pistachios, pecans, almonds.Marland Kitchen) every other day, eat 6 servings of fruit/vegetables daily and drink plenty of water and avoid sweet beverages.   2. Pelvic pain in female - US Pelvic Complete With Transvaginal; Future  3. Seasonal allergic rhinitis, unspecified trigger - triamcinolone (NASACORT ALLERGY 24HR) 55 MCG/ACT AERO  nasal inhaler; Place 1 spray into the nose daily.  Dispense: 16.9 mL; Refill: 3

## 2019-05-05 NOTE — Addendum Note (Signed)
Addended by: Saunders Glance A on: 05/05/2019 12:05 PM   Modules accepted: Orders

## 2019-05-08 DIAGNOSIS — J301 Allergic rhinitis due to pollen: Secondary | ICD-10-CM | POA: Diagnosis not present

## 2019-05-12 ENCOUNTER — Ambulatory Visit: Payer: 59

## 2019-05-12 ENCOUNTER — Other Ambulatory Visit: Payer: Self-pay

## 2019-05-12 ENCOUNTER — Ambulatory Visit
Admission: RE | Admit: 2019-05-12 | Discharge: 2019-05-12 | Disposition: A | Discharge status: Home / Self Care | Payer: 59 | Source: Ambulatory Visit | Attending: Family Medicine | Admitting: Family Medicine

## 2019-05-12 DIAGNOSIS — Z1231 Encounter for screening mammogram for malignant neoplasm of breast: Secondary | ICD-10-CM | POA: Insufficient documentation

## 2019-05-13 ENCOUNTER — Other Ambulatory Visit: Payer: Self-pay

## 2019-05-13 ENCOUNTER — Ambulatory Visit
Admission: RE | Admit: 2019-05-13 | Discharge: 2019-05-13 | Disposition: A | Discharge status: Home / Self Care | Payer: 59 | Source: Ambulatory Visit | Attending: Family Medicine | Admitting: Family Medicine

## 2019-05-13 DIAGNOSIS — R102 Pelvic and perineal pain: Secondary | ICD-10-CM | POA: Diagnosis not present

## 2019-05-15 DIAGNOSIS — J301 Allergic rhinitis due to pollen: Secondary | ICD-10-CM | POA: Diagnosis not present

## 2019-05-22 DIAGNOSIS — J301 Allergic rhinitis due to pollen: Secondary | ICD-10-CM | POA: Diagnosis not present

## 2019-05-23 DIAGNOSIS — J310 Chronic rhinitis: Secondary | ICD-10-CM | POA: Diagnosis not present

## 2019-05-23 DIAGNOSIS — J301 Allergic rhinitis due to pollen: Secondary | ICD-10-CM | POA: Diagnosis not present

## 2019-05-23 DIAGNOSIS — M26609 Unspecified temporomandibular joint disorder, unspecified side: Secondary | ICD-10-CM | POA: Diagnosis not present

## 2019-05-26 ENCOUNTER — Other Ambulatory Visit: Payer: Self-pay | Admitting: Family Medicine

## 2019-05-26 DIAGNOSIS — M25551 Pain in right hip: Secondary | ICD-10-CM

## 2019-05-26 NOTE — Telephone Encounter (Signed)
Relation to pt: self  Call back number: [phone]  Pharmacy: [facility], [place] [phone] (Phone) [phone] (Fax)     Reason for call:  Patient requesting naproxen (NAPROSYN) 500 MG tablet expedite request, informed patient please allow 48 hour turn around

## 2019-05-29 ENCOUNTER — Other Ambulatory Visit: Payer: Self-pay | Admitting: Family Medicine

## 2019-05-29 DIAGNOSIS — J301 Allergic rhinitis due to pollen: Secondary | ICD-10-CM | POA: Diagnosis not present

## 2019-05-29 NOTE — Telephone Encounter (Signed)
Patient requesting 90 day supply of this medication.

## 2019-05-29 NOTE — Addendum Note (Signed)
Addended by: Chilton Greathouse on: 05/29/2019 10:39 AM   Modules accepted: Orders

## 2019-06-04 ENCOUNTER — Ambulatory Visit (INDEPENDENT_AMBULATORY_CARE_PROVIDER_SITE_OTHER): Payer: 59 | Admitting: Family Medicine

## 2019-06-04 ENCOUNTER — Encounter: Payer: Self-pay | Admitting: Family Medicine

## 2019-06-04 ENCOUNTER — Other Ambulatory Visit: Payer: Self-pay

## 2019-06-04 VITALS — BP 116/64 | HR 76 | Temp 97.1°F | Resp 16 | Ht 63.0 in | Wt 143.3 lb

## 2019-06-04 DIAGNOSIS — K219 Gastro-esophageal reflux disease without esophagitis: Secondary | ICD-10-CM

## 2019-06-04 DIAGNOSIS — M2550 Pain in unspecified joint: Secondary | ICD-10-CM | POA: Diagnosis not present

## 2019-06-04 DIAGNOSIS — E8881 Metabolic syndrome: Secondary | ICD-10-CM

## 2019-06-04 DIAGNOSIS — J302 Other seasonal allergic rhinitis: Secondary | ICD-10-CM | POA: Diagnosis not present

## 2019-06-04 DIAGNOSIS — J3089 Other allergic rhinitis: Secondary | ICD-10-CM

## 2019-06-04 DIAGNOSIS — J301 Allergic rhinitis due to pollen: Secondary | ICD-10-CM | POA: Diagnosis not present

## 2019-06-04 DIAGNOSIS — R7303 Prediabetes: Secondary | ICD-10-CM | POA: Diagnosis not present

## 2019-06-04 DIAGNOSIS — M26623 Arthralgia of bilateral temporomandibular joint: Secondary | ICD-10-CM | POA: Diagnosis not present

## 2019-06-04 LAB — POCT GLYCOSYLATED HEMOGLOBIN (HGB A1C): HbA1c, POC (controlled diabetic range): 5.9 % (ref 0.0–7.0)

## 2019-06-04 MED ORDER — OMEPRAZOLE 40 MG PO CPDR: 40.0000 mg | DELAYED_RELEASE_CAPSULE | Freq: Every day | ORAL | 1 refills | Status: DC

## 2019-06-04 NOTE — Progress Notes (Signed)
Pt is present today for annual exam and pap smear. Pt stated that she is doing well no problems.

## 2019-06-04 NOTE — Patient Instructions (Signed)

## 2019-06-04 NOTE — Progress Notes (Signed)
Name: Makayla Mason   MRN: [id number]    DOB: Jul 13, 1966   Date:06/04/2019       Progress Note  Subjective  Chief Complaint  Chief Complaint  Patient presents with  . Medication Refill    6 month F/U  . Insulin resistance  . Gastroesophageal Reflux  . Allergic Rhinitis     States the nasal spray gives her a bad taste in her mouth and would like to switch back to her old one.   Marland Kitchen LGISL    HPI  Insulin Resistance: She denies polyphagia, polyuria or polyphagia. Discussed life style modification also.HgbA1C 6.0 went down to 5.3% at one point but now is 5.9%, she is trying her best to keep sugar down. She eats sweets very seldom.  GERD: she takes Omeprazole daily, and symptoms are controlled, no heartburn or regurgitation, avoiding fried food and spicy food. Discussed long term use of PPI, she has been taking medication every other day now and symptoms are controlled   Perennial AR: she started allergy shots Fall 2019 with ENT  No wheezing or SOB. Denies sneezing or rhinorrhea., she states still has very mild congestion with high pollen counts.   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 Marcelline Mates in 2019  and had normal pap smear with positive HPV, she has a follow up tomorrow with Dr. Marcelline Mates   Mild elevation of LDL: discussed healthy diet with her.   TMJ: pain when she yawns and sometimes when she gets up, she is avoiding chewing gum, doing better since.   Patient Active Problem List   Diagnosis Date Noted  . Cervical radicular pain 07/15/2018  . Overweight (BMI 25.0-29.9) 01/29/2018  . Right hip pain 01/29/2018  . Insulin resistance 02/28/2017  . Low grade squamous intraepith lesion on cytologic smear cervix (lgsil) 02/28/2017  . GERD without esophagitis 03/15/2016  . Left ankle pain 03/15/2016  . Allergic rhinitis, seasonal 05/21/2015  . Scoliosis 05/21/2015  . Arthralgia of temporomandibular joint 05/21/2015    Past Surgical History:  Procedure Laterality  Date  . COLONOSCOPY WITH PROPOFOL N/A 02/15/2018   Procedure: COLONOSCOPY WITH PROPOFOL;  Surgeon: Jonathon Bellows, MD;  Location: [facility] ENDOSCOPY;  Service: Gastroenterology;  Laterality: N/A;    Family History  Problem Relation Age of Onset  . Breast cancer Cousin   . Diabetes Mother   . Diabetes Father   . Hypertension Father   . Asthma Daughter   . Anemia Maternal Aunt   . Stroke Maternal Aunt   . Heart disease Maternal Grandmother   . Kidney disease Maternal Grandmother   . Hypertension Maternal Grandmother     Social History   Socioeconomic History  . Marital status: Divorced    Spouse name: Not on file  . Number of children: 2  . Years of education: Not on file  . Highest education level: Bachelor's degree (e.g., BA, AB, BS)  Occupational History  . Not on file  Social Needs  . Financial resource strain: Not hard at all  . Food insecurity    Worry: Never true    Inability: Never true  . Transportation needs    Medical: No    Non-medical: No  Tobacco Use  . Smoking status: Never Smoker  . Smokeless tobacco: Never Used  Substance and Sexual Activity  . Alcohol use: Yes    Alcohol/week: 0.0 standard drinks    Comment: occasional  . Drug use: No  . Sexual activity: Not Currently  Birth control/protection: Post-menopausal  Lifestyle  . Physical activity    Days per week: Not on file    Minutes per session: Not on file  . Stress: Not at all  Relationships  . Social connections    Talks on phone: More than three times a week    Gets together: More than three times a week    Attends religious service: More than 4 times per year    Active member of club or organization: No    Attends meetings of clubs or organizations: Never    Relationship status: Divorced  . Intimate partner violence    Fear of current or ex partner: No    Emotionally abused: No    Physically abused: No    Forced sexual activity: No  Other Topics Concern  . Not on file  Social History  Narrative   Lives in [place], grown daughters and grandson are at home     Current Outpatient Medications:  .  AUVI-Q 0.3 MG/0.3ML SOAJ injection, INJECT AS NEEDED FOR SEVERE ALLERGIC REACTION INCLUDING ANAPHYLAXIS AS DIRECTED, Disp: , Rfl: 1 .  levocetirizine (XYZAL) 5 MG tablet, Take 1 tablet (5 mg total) by mouth every evening., Disp: 90 tablet, Rfl: 1 .  lidocaine (XYLOCAINE) 2 % solution, Use as directed 15 mLs in the mouth or throat every 6 (six) hours as needed for mouth pain., Disp: 50 mL, Rfl: 0 .  montelukast (SINGULAIR) 10 MG tablet, Take 1 tablet (10 mg total) by mouth at bedtime., Disp: 90 tablet, Rfl: 3 .  naproxen (NAPROSYN) 500 MG tablet, TAKE 1 TABLET BY MOUTH 2 TIMES DAILY WITH A MEAL., Disp: 30 tablet, Rfl: 0 .  omeprazole (PRILOSEC) 40 MG capsule, Take 1 capsule (40 mg total) by mouth daily., Disp: 90 capsule, Rfl: 1 .  triamcinolone (NASACORT ALLERGY 24HR) 55 MCG/ACT AERO nasal inhaler, Place 1 spray into the nose daily., Disp: 16.9 mL, Rfl: 3  Allergies  Allergen Reactions  . Codeine Nausea And Vomiting    I personally reviewed active problem list, medication list, allergies, family history, social history with the patient/caregiver today.   ROS  Constitutional: Negative for fever or weight change.  Respiratory: Negative for cough and shortness of breath.   Cardiovascular: Negative for chest pain or palpitations.  Gastrointestinal: Negative for abdominal pain, no bowel changes.  Musculoskeletal: Negative for gait problem or joint swelling.  Skin: Negative for rash.  Neurological: Negative for dizziness or headache.  No other specific complaints in a complete review of systems (except as listed in HPI above).  Objective  Vitals:   06/04/19 0945  BP: 116/64  Pulse: 76  Resp: 16  Temp: (!) 97.1 F (36.2 C)  TempSrc: Temporal  SpO2: 94%  Weight: 143 lb 4.8 oz (65 kg)  Height: 5\' 3"  (1.6 m)    Body mass index is 25.38 kg/m.  Physical  Exam  Constitutional: Patient appears well-developed and well-nourished.  No distress.  HEENT: head atraumatic, normocephalic, pupils equal and reactive to light,  neck supple Cardiovascular: Normal rate, regular rhythm and normal heart sounds.  No murmur heard. No BLE edema. Pulmonary/Chest: Effort normal and breath sounds normal. No respiratory distress. Abdominal: Soft.  There is no tenderness. Psychiatric: Patient has a normal mood and affect. behavior is normal. Judgment and thought content normal.  Recent Results (from the past 2160 hour(s))  POCT HgB A1C     Status: Normal   Collection Time: 06/04/19 10:05 AM  Result Value Ref Range  Hemoglobin A1C     HbA1c POC (<> result, manual entry)     HbA1c, POC (prediabetic range)     HbA1c, POC (controlled diabetic range) 5.9 0.0 - 7.0 %      PHQ2/9: Depression screen [facility] 2/9 06/04/2019 05/05/2019 01/20/2019 12/31/2018 11/22/2018  Decreased Interest 0 0 0 0 0  Down, Depressed, Hopeless 0 0 0 0 0  PHQ - 2 Score 0 0 0 0 0  Altered sleeping 0 0 0 0 0  Tired, decreased energy 0 0 0 0 0  Change in appetite 0 0 0 0 0  Feeling bad or failure about yourself  0 0 0 0 0  Trouble concentrating 0 0 0 0 0  Moving slowly or fidgety/restless 0 0 0 0 0  Suicidal thoughts 0 0 0 0 0  PHQ-9 Score 0 0 0 0 0  Difficult doing work/chores Not difficult at all Not difficult at all Not difficult at all Not difficult at all Not difficult at all  Some recent data might be hidden    phq 9 is negative   Fall Risk: Fall Risk  06/04/2019 06/04/2019 05/05/2019 01/20/2019 12/31/2018  Falls in the past year? 1 1 1  0 0  Number falls in past yr: 0 0 0 0 0  Injury with Fall? 1 1 1  0 0  Comment left ankle - - - -  Follow up - - - - Falls evaluation completed    Functional Status Survey: Is the patient deaf or have difficulty hearing?: No Does the patient have difficulty seeing, even when wearing glasses/contacts?: No Does the patient have difficulty  concentrating, remembering, or making decisions?: No Does the patient have difficulty walking or climbing stairs?: No Does the patient have difficulty dressing or bathing?: No Does the patient have difficulty doing errands alone such as visiting a doctor's office or shopping?: No    Assessment & Plan  1. Insulin resistance  - POCT HgB A1C  2. Bilateral temporomandibular joint pain  We will give her information about it   3. Perennial allergic rhinitis with seasonal variation  Continue follow up with ENT  4. Prediabetes  Doing well on life style modification   5. GERD without esophagitis  - omeprazole (PRILOSEC) 40 MG capsule; Take 1 capsule (40 mg total) by mouth daily.  Dispense: 90 capsule; Refill: 1  6. Arthralgia, unspecified joint  Advised to not take naproxen often, taking tylenol prn

## 2019-06-04 NOTE — Patient Instructions (Signed)
Preventive Care 40-55 Years Old, Female Preventive care refers to visits with your health care provider and lifestyle choices that can promote health and wellness. This includes:  A yearly physical exam. This may also be called an annual well check.  Regular dental visits and eye exams.  Immunizations.  Screening for certain conditions.  Healthy lifestyle choices, such as eating a healthy diet, getting regular exercise, not using drugs or products that contain nicotine and tobacco, and limiting alcohol use. What can I expect for my preventive care visit? Physical exam Your health care provider will check your:  Height and weight. This may be used to calculate body mass index (BMI), which tells if you are at a healthy weight.  Heart rate and blood pressure.  Skin for abnormal spots. Counseling Your health care provider may ask you questions about your:  Alcohol, tobacco, and drug use.  Emotional well-being.  Home and relationship well-being.  Sexual activity.  Eating habits.  Work and work environment.  Method of birth control.  Menstrual cycle.  Pregnancy history. What immunizations do I need?  Influenza (flu) vaccine  This is recommended every year. Tetanus, diphtheria, and pertussis (Tdap) vaccine  You may need a Td booster every 10 years. Varicella (chickenpox) vaccine  You may need this if you have not been vaccinated. Zoster (shingles) vaccine  You may need this after age 60. Measles, mumps, and rubella (MMR) vaccine  You may need at least one dose of MMR if you were born in 1957 or later. You may also need a second dose. Pneumococcal conjugate (PCV13) vaccine  You may need this if you have certain conditions and were not previously vaccinated. Pneumococcal polysaccharide (PPSV23) vaccine  You may need one or two doses if you smoke cigarettes or if you have certain conditions. Meningococcal conjugate (MenACWY) vaccine  You may need this if you  have certain conditions. Hepatitis A vaccine  You may need this if you have certain conditions or if you travel or work in places where you may be exposed to hepatitis A. Hepatitis B vaccine  You may need this if you have certain conditions or if you travel or work in places where you may be exposed to hepatitis B. Haemophilus influenzae type b (Hib) vaccine  You may need this if you have certain conditions. Human papillomavirus (HPV) vaccine  If recommended by your health care provider, you may need three doses over 6 months. You may receive vaccines as individual doses or as more than one vaccine together in one shot (combination vaccines). Talk with your health care provider about the risks and benefits of combination vaccines. What tests do I need? Blood tests  Lipid and cholesterol levels. These may be checked every 5 years, or more frequently if you are over 50 years old.  Hepatitis C test.  Hepatitis B test. Screening  Lung cancer screening. You may have this screening every year starting at age 55 if you have a 30-pack-year history of smoking and currently smoke or have quit within the past 15 years.  Colorectal cancer screening. All adults should have this screening starting at age 50 and continuing until age 75. Your health care provider may recommend screening at age 45 if you are at increased risk. You will have tests every 1-10 years, depending on your results and the type of screening test.  Diabetes screening. This is done by checking your blood sugar (glucose) after you have not eaten for a while (fasting). You may have this   done every 1-3 years.  Mammogram. This may be done every 1-2 years. Talk with your health care provider about when you should start having regular mammograms. This may depend on whether you have a family history of breast cancer.  BRCA-related cancer screening. This may be done if you have a family history of breast, ovarian, tubal, or peritoneal  cancers.  Pelvic exam and Pap test. This may be done every 3 years starting at age 23. Starting at age 86, this may be done every 5 years if you have a Pap test in combination with an HPV test. Other tests  Sexually transmitted disease (STD) testing.  Bone density scan. This is done to screen for osteoporosis. You may have this scan if you are at high risk for osteoporosis. Follow these instructions at home: Eating and drinking  Eat a diet that includes fresh fruits and vegetables, whole grains, lean protein, and low-fat dairy.  Take vitamin and mineral supplements as recommended by your health care provider.  Do not drink alcohol if: ? Your health care provider tells you not to drink. ? You are pregnant, may be pregnant, or are planning to become pregnant.  If you drink alcohol: ? Limit how much you have to 0-1 drink a day. ? Be aware of how much alcohol is in your drink. In the [place] one drink equals one 12 oz bottle of beer (355 mL), one 5 oz glass of wine (148 mL), or one 1 oz glass of hard liquor (44 mL). Lifestyle  Take daily care of your teeth and gums.  Stay active. Exercise for at least 30 minutes on 5 or more days each week.  Do not use any products that contain nicotine or tobacco, such as cigarettes, e-cigarettes, and chewing tobacco. If you need help quitting, ask your health care provider.  If you are sexually active, practice safe sex. Use a condom or other form of birth control (contraception) in order to prevent pregnancy and STIs (sexually transmitted infections).  If told by your health care provider, take low-dose aspirin daily starting at age 27. What's next?  Visit your health care provider once a year for a well check visit.  Ask your health care provider how often you should have your eyes and teeth checked.  Stay up to date on all vaccines. This information is not intended to replace advice given to you by your health care provider. Make sure you  discuss any questions you have with your health care provider. Document Released: 11/19/2015 Document Revised: 07/04/2018 Document Reviewed: 07/04/2018 Elsevier Patient Education  2020 [place] self-awareness is knowing how your breasts look and feel. Doing breast self-awareness is important. It allows you to catch a breast problem early while it is still small and can be treated. All women should do breast self-awareness, including women who have had breast implants. Tell your doctor if you notice a change in your breasts. What you need:  A mirror.  A well-lit room. How to do a breast self-exam A breast self-exam is one way to learn what is normal for your breasts and to check for changes. To do a breast self-exam: Look for changes  1. Take off all the clothes above your waist. 2. Stand in front of a mirror in a room with good lighting. 3. Put your hands on your hips. 4. Push your hands down. 5. Look at your breasts and nipples in the mirror to see if one breast or nipple looks  different from the other. Check to see if: ? The shape of one breast is different. ? The size of one breast is different. ? There are wrinkles, dips, and bumps in one breast and not the other. 6. Look at each breast for changes in the skin, such as: ? Redness. ? Scaly areas. 7. Look for changes in your nipples, such as: ? Liquid around the nipples. ? Bleeding. ? Dimpling. ? Redness. ? A change in where the nipples are. Feel for changes  1. Lie on your back on the floor. 2. Feel each breast. To do this, follow these steps: ? Pick a breast to feel. ? Put the arm closest to that breast above your head. ? Use your other arm to feel the nipple area of your breast. Feel the area with the pads of your three middle fingers by making small circles with your fingers. For the first circle, press lightly. For the second circle, press harder. For the third circle, press even harder.  ? Keep making circles with your fingers at the different pressures as you move down your breast. Stop when you feel your ribs. ? Move your fingers a little toward the center of your body. ? Start making circles with your fingers again, this time going up until you reach your collarbone. ? Keep making up-and-down circles until you reach your armpit. Remember to keep using the three pressures. ? Feel the other breast in the same way. 3. Sit or stand in the tub or shower. 4. With soapy water on your skin, feel each breast the same way you did in step 2 when you were lying on the floor. Write down what you find Writing down what you find can help you remember what to tell your doctor. Write down:  What is normal for each breast.  Any changes you find in each breast, including: ? The kind of changes you find. ? Whether you have pain. ? Size and location of any lumps.  When you last had your menstrual period. General tips  Check your breasts every month.  If you are breastfeeding, the best time to check your breasts is after you feed your baby or after you use a breast pump.  If you get menstrual periods, the best time to check your breasts is 5-7 days after your menstrual period is over.  With time, you will become comfortable with the self-exam, and you will begin to know if there are changes in your breasts. Contact a doctor if you:  See a change in the shape or size of your breasts or nipples.  See a change in the skin of your breast or nipples, such as red or scaly skin.  Have fluid coming from your nipples that is not normal.  Find a lump or thick area that was not there before.  Have pain in your breasts.  Have any concerns about your breast health. Summary  Breast self-awareness includes looking for changes in your breasts, as well as feeling for changes within your breasts.  Breast self-awareness should be done in front of a mirror in a well-lit room.  You should  check your breasts every month. If you get menstrual periods, the best time to check your breasts is 5-7 days after your menstrual period is over.  Let your doctor know of any changes you see in your breasts, including changes in size, changes on the skin, pain or tenderness, or fluid from your nipples that is not normal. This  information is not intended to replace advice given to you by your health care provider. Make sure you discuss any questions you have with your health care provider. Document Released: 04/10/2008 Document Revised: 06/11/2018 Document Reviewed: 06/11/2018 Elsevier Patient Education  2020 [employer].

## 2019-06-05 ENCOUNTER — Ambulatory Visit (INDEPENDENT_AMBULATORY_CARE_PROVIDER_SITE_OTHER): Payer: 59 | Admitting: Obstetrics and Gynecology

## 2019-06-05 ENCOUNTER — Other Ambulatory Visit (HOSPITAL_COMMUNITY)
Admission: RE | Admit: 2019-06-05 | Discharge: 2019-06-05 | Disposition: A | Discharge status: Home / Self Care | Payer: 59 | Source: Ambulatory Visit | Attending: Obstetrics and Gynecology | Admitting: Obstetrics and Gynecology

## 2019-06-05 ENCOUNTER — Encounter: Payer: Self-pay | Admitting: Obstetrics and Gynecology

## 2019-06-05 VITALS — BP 115/63 | HR 77 | Ht 63.0 in | Wt 143.4 lb

## 2019-06-05 DIAGNOSIS — R102 Pelvic and perineal pain: Secondary | ICD-10-CM | POA: Diagnosis not present

## 2019-06-05 DIAGNOSIS — N952 Postmenopausal atrophic vaginitis: Secondary | ICD-10-CM

## 2019-06-05 DIAGNOSIS — Z1231 Encounter for screening mammogram for malignant neoplasm of breast: Secondary | ICD-10-CM

## 2019-06-05 DIAGNOSIS — N951 Menopausal and female climacteric states: Secondary | ICD-10-CM

## 2019-06-05 DIAGNOSIS — Z01419 Encounter for gynecological examination (general) (routine) without abnormal findings: Secondary | ICD-10-CM | POA: Insufficient documentation

## 2019-06-05 DIAGNOSIS — Z8742 Personal history of other diseases of the female genital tract: Secondary | ICD-10-CM | POA: Diagnosis not present

## 2019-06-05 NOTE — Progress Notes (Signed)
ANNUAL PREVENTATIVE CARE GYNECOLOGY  ENCOUNTER NOTE  Subjective:       Makayla Mason is a 53 y.o. [phone] menopausal female here for a routine annual gynecologic exam. The patient is not sexually active. The patient has never taken hormone replacement therapy. Patient denies post-menopausal vaginal bleeding. The patient wears seatbelts: yes. The patient participates in regular exercise: no. Has the patient ever been transfused or tattooed?: no. The patient reports that there is not domestic violence in her life.  Current complaints: 1.  Patient complains of hot flushes and night sweats. Have been ongoing for years, progressively worsening. Has not tried anything for hot flushes.  2. Patient states that she continues to have intermittent pelvic pain, intermittent. She states that her PCP ordered an ultrasound which had negative findings. Patient reports that since that time, she has noticed that she has pain around the same time as her daughter's menstrual cycle.    Gynecologic History No LMP recorded. Patient is postmenopausal. Contraception: post menopausal status Last Pap: 02/13/2018. Results were: normal.  Has history of abnormal pap smears in the past (LGSIL in 2018, NILM but HPV+ pap smear). Last mammogram: 05/2019. Results were: normal Last Colonoscopy:  Last Dexa Scan:    Obstetric History OB History  Gravida Para Term Preterm AB Living  2 2 2     2   SAB TAB Ectopic Multiple Live Births          2    # Outcome Date GA Lbr Len/2nd Weight Sex Delivery Anes PTL Lv  2 Term 1997    F Vag-Spont   LIV  1 Term 1995    F Vag-Spont   LIV    Past Medical History:  Diagnosis Date  . Allergic rhinitis, seasonal   . Mild scoliosis   . TMJ arthralgia    right side    Family History  Problem Relation Age of Onset  . Breast cancer Cousin   . Diabetes Mother   . Diabetes Father   . Hypertension Father   . Asthma Daughter   . Anemia Maternal Aunt   . Stroke Maternal Aunt   .  Heart disease Maternal Grandmother   . Kidney disease Maternal Grandmother   . Hypertension Maternal Grandmother     Past Surgical History:  Procedure Laterality Date  . COLONOSCOPY WITH PROPOFOL N/A 02/15/2018   Procedure: COLONOSCOPY WITH PROPOFOL;  Surgeon: Jonathon Bellows, MD;  Location: [facility] ENDOSCOPY;  Service: Gastroenterology;  Laterality: N/A;    Social History   Socioeconomic History  . Marital status: Divorced    Spouse name: Not on file  . Number of children: 2  . Years of education: Not on file  . Highest education level: Bachelor's degree (e.g., BA, AB, BS)  Occupational History  . Not on file  Social Needs  . Financial resource strain: Not hard at all  . Food insecurity    Worry: Never true    Inability: Never true  . Transportation needs    Medical: No    Non-medical: No  Tobacco Use  . Smoking status: Never Smoker  . Smokeless tobacco: Never Used  Substance and Sexual Activity  . Alcohol use: Yes    Alcohol/week: 0.0 standard drinks    Comment: occasional  . Drug use: No  . Sexual activity: Not Currently    Birth control/protection: Post-menopausal  Lifestyle  . Physical activity    Days per week: Not on file    Minutes per session: Not on  file  . Stress: Not at all  Relationships  . Social connections    Talks on phone: More than three times a week    Gets together: More than three times a week    Attends religious service: More than 4 times per year    Active member of club or organization: No    Attends meetings of clubs or organizations: Never    Relationship status: Divorced  . Intimate partner violence    Fear of current or ex partner: No    Emotionally abused: No    Physically abused: No    Forced sexual activity: No  Other Topics Concern  . Not on file  Social History Narrative   Lives in [place], grown daughters and grandson are at home    Current Outpatient Medications on File Prior to Visit  Medication Sig Dispense Refill  .  AUVI-Q 0.3 MG/0.3ML SOAJ injection INJECT AS NEEDED FOR SEVERE ALLERGIC REACTION INCLUDING ANAPHYLAXIS AS DIRECTED  1  . levocetirizine (XYZAL) 5 MG tablet Take 1 tablet (5 mg total) by mouth every evening. 90 tablet 1  . lidocaine (XYLOCAINE) 2 % solution Use as directed 15 mLs in the mouth or throat every 6 (six) hours as needed for mouth pain. 50 mL 0  . montelukast (SINGULAIR) 10 MG tablet Take 1 tablet (10 mg total) by mouth at bedtime. 90 tablet 3  . naproxen (NAPROSYN) 500 MG tablet TAKE 1 TABLET BY MOUTH 2 TIMES DAILY WITH A MEAL. 30 tablet 0  . omeprazole (PRILOSEC) 40 MG capsule Take 1 capsule (40 mg total) by mouth daily. 90 capsule 1  . triamcinolone (NASACORT ALLERGY 24HR) 55 MCG/ACT AERO nasal inhaler Place 1 spray into the nose daily. 16.9 mL 3   No current facility-administered medications on file prior to visit.     Allergies  Allergen Reactions  . Codeine Nausea And Vomiting      Review of Systems ROS Review of Systems - General ROS: negative for - chills, fatigue, fever, hot flashes, night sweats, weight gain or weight loss Psychological ROS: negative for - anxiety, decreased libido, depression, mood swings, physical abuse or sexual abuse Ophthalmic ROS: negative for - blurry vision, eye pain or loss of vision ENT ROS: negative for - headaches, hearing change, visual changes or vocal changes Allergy and Immunology ROS: negative for - hives, itchy/watery eyes or seasonal allergies Hematological and Lymphatic ROS: negative for - bleeding problems, bruising, swollen lymph nodes or weight loss Endocrine ROS: negative for - galactorrhea, hair pattern changes, hot flashes, malaise/lethargy, mood swings, palpitations, polydipsia/polyuria, skin changes, temperature intolerance or unexpected weight changes Breast ROS: negative for - new or changing breast lumps or nipple discharge Respiratory ROS: negative for - cough or shortness of breath Cardiovascular ROS: negative for -  chest pain, irregular heartbeat, palpitations or shortness of breath Gastrointestinal ROS: no abdominal pain, change in bowel habits, or black or bloody stools Genito-Urinary ROS: no dysuria, trouble voiding, or hematuria Musculoskeletal ROS: negative for - joint pain or joint stiffness Neurological ROS: negative for - bowel and bladder control changes Dermatological ROS: negative for rash and skin lesion changes   Objective:   BP 115/63   Pulse 77   Ht 5\' 3"  (1.6 m)   Wt 143 lb 6.4 oz (65 kg)   BMI 25.40 kg/m  CONSTITUTIONAL: Well-developed, well-nourished female in no acute distress.  PSYCHIATRIC: Normal mood and affect. Normal behavior. Normal judgment and thought content. [place]: Alert and oriented to person, place,  and time. Normal muscle tone coordination. No cranial nerve deficit noted. HENT:  Normocephalic, atraumatic, External right and left ear normal. Oropharynx is clear and moist EYES: Conjunctivae and EOM are normal. Pupils are equal, round, and reactive to light. No scleral icterus.  NECK: Normal range of motion, supple, no masses.  Normal thyroid.  SKIN: Skin is warm and dry. No rash noted. Not diaphoretic. No erythema. No pallor. CARDIOVASCULAR: Normal heart rate noted, regular rhythm, no murmur. RESPIRATORY: Clear to auscultation bilaterally. Effort and breath sounds normal, no problems with respiration noted. BREASTS: Symmetric in size. No masses, skin changes, nipple drainage, or lymphadenopathy. ABDOMEN: Soft, normal bowel sounds, no distention noted.  No tenderness, rebound or guarding.  BLADDER: Normal PELVIC:  Bladder no bladder distension noted  Urethra: normal appearing urethra with no masses, tenderness or lesions  Vulva: not indicated  Vagina: mildly atrophic mucosa, no lesions or vaginaldischarge.   Cervix: normal appearing cervix without discharge or lesions  Uterus: uterus is normal size, shape, consistency and nontender  Adnexa: normal adnexa in  size, nontender and no masses  RV: External Exam NormaI, No Rectal Masses and Normal Sphincter tone  MUSCULOSKELETAL: Normal range of motion. No tenderness.  No cyanosis, clubbing, or edema.  2+ distal pulses. LYMPHATIC: No Axillary, Supraclavicular, or Inguinal Adenopathy.   Labs: Lab Results  Component Value Date   WBC 8.3 02/01/2017   HGB 14.1 02/01/2017   HCT 43.7 02/01/2017   MCV 89.0 02/01/2017   PLT 358 02/01/2017    Lab Results  Component Value Date   CREATININE 0.63 12/04/2018   BUN 16 12/04/2018   NA 143 12/04/2018   K 5.2 12/04/2018   CL 107 12/04/2018   CO2 29 12/04/2018    Lab Results  Component Value Date   ALT 10 12/04/2018   AST 12 12/04/2018   ALKPHOS 88 02/01/2017   BILITOT 0.5 12/04/2018    Lab Results  Component Value Date   CHOL 181 12/04/2018   HDL 53 12/04/2018   LDLCALC 107 (H) 12/04/2018   TRIG 114 12/04/2018   CHOLHDL 3.4 12/04/2018    No results found for: TSH  Lab Results  Component Value Date   HGBA1C 5.9 06/04/2019     Imaging:  US PELVIS TRANSVAGINAL NON-OB (TV ONLY) CLINICAL DATA:  Left pelvic pain intermittently for a few months.  EXAM: TRANSABDOMINAL AND TRANSVAGINAL ULTRASOUND OF PELVIS  TECHNIQUE: Both transabdominal and transvaginal ultrasound examinations of the pelvis were performed. Transabdominal technique was performed for global imaging of the pelvis including uterus, ovaries, adnexal regions, and pelvic cul-de-sac. It was necessary to proceed with endovaginal exam following the transabdominal exam to visualize the endometrium and ovaries.  COMPARISON:  None  FINDINGS: Uterus  Measurements: 6.3 x 3.3 x 3.6 cm = volume: 38.9 mL. No fibroids or other mass visualized.  Endometrium  Thickness: 4 mm.  No focal abnormality visualized.  Right ovary  Measurements: 1.5 x 1.5 x 1.4 cm = volume: 0.6 mL. Normal appearance/no adnexal mass.  Left ovary  Measurements: 1.9 x 1.0 x 1.9 cm = volume: 1.3 mL.  Normal appearance/no adnexal mass.  Other findings  No abnormal free fluid.  IMPRESSION: Normal study.  No cause for left-sided pelvic pain identified.  Electronically Signed   By: Dorise Bullion III M.D   On: 05/13/2019 09:52 US Pelvis Complete CLINICAL DATA:  Left pelvic pain intermittently for a few months.  EXAM: TRANSABDOMINAL AND TRANSVAGINAL ULTRASOUND OF PELVIS  TECHNIQUE: Both transabdominal and transvaginal ultrasound  examinations of the pelvis were performed. Transabdominal technique was performed for global imaging of the pelvis including uterus, ovaries, adnexal regions, and pelvic cul-de-sac. It was necessary to proceed with endovaginal exam following the transabdominal exam to visualize the endometrium and ovaries.  COMPARISON:  None  FINDINGS: Uterus  Measurements: 6.3 x 3.3 x 3.6 cm = volume: 38.9 mL. No fibroids or other mass visualized.  Endometrium  Thickness: 4 mm.  No focal abnormality visualized.  Right ovary  Measurements: 1.5 x 1.5 x 1.4 cm = volume: 0.6 mL. Normal appearance/no adnexal mass.  Left ovary  Measurements: 1.9 x 1.0 x 1.9 cm = volume: 1.3 mL. Normal appearance/no adnexal mass.  Other findings  No abnormal free fluid.  IMPRESSION: Normal study.  No cause for left-sided pelvic pain identified.  Electronically Signed   By: Dorise Bullion III M.D   On: 05/13/2019 09:52   Assessment:   Annual gynecologic examination 53 y.o. Contraception: post menopausal status Normal BMI Problem List Items Addressed This Visit    None    Visit Diagnoses    Encounter for well woman exam with routine gynecological exam    -  Primary   Relevant Orders   Cytology - PAP   Menopausal vasomotor syndrome       Vaginal atrophy       History of abnormal cervical Pap smear       Pelvic pain       Breast cancer screening by mammogram       Relevant Orders   MM 3D SCREEN BREAST BILATERAL      Plan:  - Pap: Pap Co Test performed  in light of history of abnormal pap smears - Mammogram: Up to date - Stool Guaiac Testing:  Not Indicated. Patient up to date with colonoscopy.  - Labs: None ordered. Labs up to date (drawn by PCP) - Routine preventative health maintenance measures emphasized: Exercise/Diet/Weight control, Tobacco Warnings, Alcohol/Substance use risks, Stress Management, Peer Pressure Issues and Safe Sex - Patient with bothersome menopausal vasomotor symptoms. Discussed lifestyle interventions such as wearing light clothing, remaining in cool environments, having fan/air conditioner in the room, avoiding hot beverages etc.  Discussed using hormone therapy and concerns about increased risk of heart disease, cerebrovascular disease, thromboembolic disease,  and breast cancer.  Also discussed other medical options such as Paxil, Effexor, Brisdelle, Clonidine,  or Neurontin.   Also discussed alternative therapies such as herbal remedies but cautioned that most of the products contained phytoestrogens (plant estrogens) in unregulated amounts which can have the same effects on the body as the pharmaceutical estrogen preparations.  Also referred her to www.[email or web address].org for other alternative options.  Patient will think over options. To notify MD if prescription medication desired.  - Pelvic pain, appears cyclical, and is syncing with her daughter's menses. Discussed that she may be experiencing menstrual synchrony, and although she is no longer having cycles. Had negative ultrasound findings. Given reassurance.    Return to [place], MD  [facility]

## 2019-06-06 ENCOUNTER — Encounter: Payer: Self-pay | Admitting: Obstetrics and Gynecology

## 2019-06-10 ENCOUNTER — Other Ambulatory Visit: Payer: Self-pay

## 2019-06-10 ENCOUNTER — Encounter: Payer: Self-pay | Admitting: Family Medicine

## 2019-06-10 ENCOUNTER — Ambulatory Visit (INDEPENDENT_AMBULATORY_CARE_PROVIDER_SITE_OTHER): Payer: 59 | Admitting: Family Medicine

## 2019-06-10 VITALS — BP 110/70 | HR 70 | Temp 97.3°F | Resp 16 | Ht 63.0 in | Wt 141.6 lb

## 2019-06-10 DIAGNOSIS — M25572 Pain in left ankle and joints of left foot: Secondary | ICD-10-CM | POA: Diagnosis not present

## 2019-06-10 LAB — CYTOLOGY - PAP
Diagnosis: NEGATIVE
HPV: NOT DETECTED

## 2019-06-10 MED ORDER — DICLOFENAC SODIUM 1 % TD GEL: 2.0000 g | Freq: Four times a day (QID) | TRANSDERMAL | 0 refills | Status: DC

## 2019-06-10 NOTE — Progress Notes (Signed)
Name: Makayla Mason   MRN: [id number]    DOB: 1966-05-13   Date:06/10/2019       Progress Note  Subjective  Chief Complaint  Chief Complaint  Patient presents with  . Arthritis    she thinks she has arthritis in her left foot x 3 weeks. Pain is throbbing and constant. She has been treating pain with Tylenol with no relief. Bending and sitting to long make the pain worse.    HPI   Left ankle pain: she sates pain has been intermittent for the past few weeks, she sates she has fallen twice in the past on that left ankle. She states pain is aching like on the medical aspect of left ankle, no swelling or increase in warmth, worse when she rests and starts to move again No instability. Seen by Raelyn Ensign and has been taking Tylenol and Naproxen  without much help.    Patient Active Problem List   Diagnosis Date Noted  . Cervical radicular pain 07/15/2018  . Overweight (BMI 25.0-29.9) 01/29/2018  . Right hip pain 01/29/2018  . Insulin resistance 02/28/2017  . Low grade squamous intraepith lesion on cytologic smear cervix (lgsil) 02/28/2017  . GERD without esophagitis 03/15/2016  . Left ankle pain 03/15/2016  . Allergic rhinitis, seasonal 05/21/2015  . Scoliosis 05/21/2015  . 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 COLONOSCOPY WITH PROPOFOL N/A 02/15/2018   Procedure: COLONOSCOPY WITH PROPOFOL;  Surgeon: Jonathon Bellows, MD;  Location: [facility] ENDOSCOPY;  Service: Gastroenterology;  Laterality: N/A;    Family History  Problem Relation Age of Onset  . Breast cancer Cousin   . Diabetes Mother   . Diabetes Father   . Hypertension Father   . Asthma Daughter   . Anemia Maternal Aunt   . Stroke Maternal Aunt   . Heart disease Maternal Grandmother   . Kidney disease Maternal Grandmother   . Hypertension Maternal Grandmother     Social History   Socioeconomic History  . Marital status: Divorced    Spouse name: Not on  file  . Number of children: 2  . Years of education: Not on file  . Highest education level: Bachelor's degree (e.g., BA, AB, BS)  Occupational History  . Not on file  Social Needs  . Financial resource strain: Not hard at all  . Food insecurity    Worry: Never true    Inability: Never true  . Transportation needs    Medical: No    Non-medical: No  Tobacco Use  . Smoking status: Never Smoker  . Smokeless tobacco: Never Used  Substance and Sexual Activity  . Alcohol use: Yes    Alcohol/week: 0.0 standard drinks    Comment: occasional  . Drug use: No  . Sexual activity: Not Currently    Birth control/protection: Post-menopausal  Lifestyle  . Physical activity    Days per week: Not on file    Minutes per session: Not on file  . Stress: Not at all  Relationships  . Social connections    Talks on phone: More than three times a week    Gets together: More than three times a week    Attends religious service: More than 4 times per year    Active member of club or organization: No    Attends meetings of clubs or organizations: Never    Relationship status: Divorced  . Intimate partner violence    Fear of current  or ex partner: No    Emotionally abused: No    Physically abused: No    Forced sexual activity: No  Other Topics Concern  . Not on file  Social History Narrative   Lives in [place], grown daughters and grandson are at home     Current Outpatient Medications:  .  AUVI-Q 0.3 MG/0.3ML SOAJ injection, INJECT AS NEEDED FOR SEVERE ALLERGIC REACTION INCLUDING ANAPHYLAXIS AS DIRECTED, Disp: , Rfl: 1 .  levocetirizine (XYZAL) 5 MG tablet, Take 1 tablet (5 mg total) by mouth every evening., Disp: 90 tablet, Rfl: 1 .  lidocaine (XYLOCAINE) 2 % solution, Use as directed 15 mLs in the mouth or throat every 6 (six) hours as needed for mouth pain., Disp: 50 mL, Rfl: 0 .  montelukast (SINGULAIR) 10 MG tablet, Take 1 tablet (10 mg total) by mouth at bedtime., Disp: 90 tablet,  Rfl: 3 .  naproxen (NAPROSYN) 500 MG tablet, TAKE 1 TABLET BY MOUTH 2 TIMES DAILY WITH A MEAL., Disp: 30 tablet, Rfl: 0 .  omeprazole (PRILOSEC) 40 MG capsule, Take 1 capsule (40 mg total) by mouth daily., Disp: 90 capsule, Rfl: 1 .  triamcinolone (NASACORT ALLERGY 24HR) 55 MCG/ACT AERO nasal inhaler, Place 1 spray into the nose daily., Disp: 16.9 mL, Rfl: 3  Allergies  Allergen Reactions  . Codeine Nausea And Vomiting    I personally reviewed active problem list, medication list, allergies, family history, social history with the patient/caregiver today.   ROS  Constitutional: Negative for fever or weight change.  Respiratory: Negative for cough and shortness of breath.   Cardiovascular: Negative for chest pain or palpitations.  Gastrointestinal: Negative for abdominal pain, no bowel changes.  Musculoskeletal: Positive  for gait problem and some  joint swelling.  Skin: Negative for rash.  Neurological: Negative for dizziness or headache.  No other specific complaints in a complete review of systems (except as listed in HPI above).  Objective  Vitals:   06/10/19 1005  BP: 110/70  Pulse: 70  Resp: 16  Temp: (!) 97.3 F (36.3 C)  TempSrc: Temporal  SpO2: 98%  Weight: 141 lb 9.6 oz (64.2 kg)  Height: 5\' 3"  (1.6 m)    Body mass index is 25.08 kg/m.  Physical Exam  Constitutional: Patient appears well-developed and well-nourished. No distress.  HEENT: head atraumatic, normocephalic, pupils equal and reactive to light, neck supple, throat within normal limits Cardiovascular: Normal rate, regular rhythm and normal heart sounds.  No murmur heard. No BLE edema. Pulmonary/Chest: Effort normal and breath sounds normal. No respiratory distress. Abdominal: Soft.  There is no tenderness. Muscular Skeletal: pain during palpation of medical left ankle, below left medial malleolus, mild fullness, no erythema or increase in warmth  Psychiatric: Patient has a normal mood and affect.  behavior is normal. Judgment and thought content normal.  Recent Results (from the past 2160 hour(s))  POCT HgB A1C     Status: Normal   Collection Time: 06/04/19 10:05 AM  Result Value Ref Range   Hemoglobin A1C     HbA1c POC (<> result, manual entry)     HbA1c, POC (prediabetic range)     HbA1c, POC (controlled diabetic range) 5.9 0.0 - 7.0 %  Cytology - PAP     Status: None   Collection Time: 06/05/19 12:00 AM  Result Value Ref Range   Adequacy      Satisfactory for evaluation. The presence or absence of an endocervical / transformation zone component cannot be determined because of  atrophy.   Diagnosis      NEGATIVE FOR INTRAEPITHELIAL LESIONS OR MALIGNANCY.   HPV NOT Detected     Comment: Normal Reference Range - NOT Detected   Material Submitted CervicoVaginal Pap [ThinPrep Imaged]    CYTOLOGY - PAP PAP RESULT      PHQ2/9: Depression screen [facility] 2/9 06/10/2019 06/04/2019 05/05/2019 01/20/2019 12/31/2018  Decreased Interest 0 0 0 0 0  Down, Depressed, Hopeless 0 0 0 0 0  PHQ - 2 Score 0 0 0 0 0  Altered sleeping 0 0 0 0 0  Tired, decreased energy 0 0 0 0 0  Change in appetite 0 0 0 0 0  Feeling bad or failure about yourself  0 0 0 0 0  Trouble concentrating 0 0 0 0 0  Moving slowly or fidgety/restless 0 0 0 0 0  Suicidal thoughts 0 0 0 0 0  PHQ-9 Score 0 0 0 0 0  Difficult doing work/chores - Not difficult at all Not difficult at all Not difficult at all Not difficult at all  Some recent data might be hidden    phq 9 is negative   Fall Risk: Fall Risk  06/10/2019 06/04/2019 06/04/2019 05/05/2019 01/20/2019  Falls in the past year? 0 1 1 1  0  Number falls in past yr: 0 0 0 0 0  Injury with Fall? 0 1 1 1  0  Comment - left ankle - - -  Follow up - - - - -     Functional Status Survey: Is the patient deaf or have difficulty hearing?: No Does the patient have difficulty seeing, even when wearing glasses/contacts?: No Does the patient have difficulty concentrating,  remembering, or making decisions?: No Does the patient have difficulty walking or climbing stairs?: No Does the patient have difficulty dressing or bathing?: No Does the patient have difficulty doing errands alone such as visiting a doctor's office or shopping?: No   Assessment & Plan  1. Acute left ankle pain  - Ambulatory referral to Podiatry - diclofenac sodium (VOLTAREN) 1 % GEL; Apply 2 g topically 4 (four) times daily.  Dispense: 100 g; Refill: 0

## 2019-06-12 DIAGNOSIS — J301 Allergic rhinitis due to pollen: Secondary | ICD-10-CM | POA: Diagnosis not present

## 2019-06-19 DIAGNOSIS — J301 Allergic rhinitis due to pollen: Secondary | ICD-10-CM | POA: Diagnosis not present

## 2019-06-24 ENCOUNTER — Ambulatory Visit (INDEPENDENT_AMBULATORY_CARE_PROVIDER_SITE_OTHER): Payer: 59 | Admitting: Podiatry

## 2019-06-24 ENCOUNTER — Other Ambulatory Visit: Payer: Self-pay | Admitting: Podiatry

## 2019-06-24 ENCOUNTER — Encounter: Payer: Self-pay | Admitting: Podiatry

## 2019-06-24 ENCOUNTER — Ambulatory Visit (INDEPENDENT_AMBULATORY_CARE_PROVIDER_SITE_OTHER): Payer: 59

## 2019-06-24 ENCOUNTER — Other Ambulatory Visit: Payer: Self-pay

## 2019-06-24 VITALS — BP 124/72 | HR 72

## 2019-06-24 DIAGNOSIS — M65972 Unspecified synovitis and tenosynovitis, left ankle and foot: Secondary | ICD-10-CM

## 2019-06-24 DIAGNOSIS — M659 Synovitis and tenosynovitis, unspecified: Secondary | ICD-10-CM | POA: Diagnosis not present

## 2019-06-24 DIAGNOSIS — M7752 Other enthesopathy of left foot: Secondary | ICD-10-CM

## 2019-06-24 DIAGNOSIS — M65072 Abscess of tendon sheath, left ankle and foot: Secondary | ICD-10-CM

## 2019-06-24 MED ORDER — MELOXICAM 15 MG PO TABS: 15.0000 mg | ORAL_TABLET | Freq: Every day | ORAL | 1 refills | Status: DC

## 2019-06-26 NOTE — Progress Notes (Signed)
   Subjective:  53 y.o. female presenting today as a new patient with a chief complaint of left medial ankle pain that began about 8 months ago. She states she fell 4-5 years ago and sprained the ankle and then fell again 8 months ago which has caused aching pain ever since. She reports associated swelling. Applying pressure or flexing the ankle increases the pain. She has not had any treatment for the complaint. Patient is here for further evaluation and treatment.   Past Medical History:  Diagnosis Date  . Allergic rhinitis, seasonal   . Mild scoliosis   . TMJ arthralgia    right side     Objective / Physical Exam:  General:  The patient is alert and oriented x3 in no acute distress. Dermatology:  Skin is warm, dry and supple bilateral lower extremities. Negative for open lesions or macerations. Vascular:  Palpable pedal pulses bilaterally. No edema or erythema noted. Capillary refill within normal limits. Neurological:  Epicritic and protective threshold grossly intact bilaterally.  Musculoskeletal Exam:  Pain on palpation to the anterior lateral medial aspects of the patient's left ankle. Mild edema noted. Range of motion within normal limits to all pedal and ankle joints bilateral. Muscle strength 5/5 in all groups bilateral.   Radiographic Exam:  Normal osseous mineralization. Joint spaces preserved. No fracture/dislocation/boney destruction.    Assessment: 1. Ankle synovitis left / medial deltoid ligament sprain  Plan of Care:  1. Patient was evaluated. X-Rays reviewed.  2. Injection of 0.5 mL Celestone Soluspan injected in the patient's left ankle. 3. Prescription for Meloxicam provided to patient. 4. Declined CAM boot.  5. Ankle brace dispensed.  6. Return to clinic in 4 weeks.   Works at [employer] at [employer].    Edrick Kins, DPM [facility]  Dr. Edrick Kins, [address]                                        [place], [place]  [address]                Office [phone]  Fax [phone]

## 2019-07-03 DIAGNOSIS — J301 Allergic rhinitis due to pollen: Secondary | ICD-10-CM | POA: Diagnosis not present

## 2019-07-08 ENCOUNTER — Other Ambulatory Visit: Payer: Self-pay

## 2019-07-08 ENCOUNTER — Encounter: Payer: Self-pay | Admitting: Family Medicine

## 2019-07-08 ENCOUNTER — Ambulatory Visit (INDEPENDENT_AMBULATORY_CARE_PROVIDER_SITE_OTHER): Payer: 59 | Admitting: Family Medicine

## 2019-07-08 VITALS — BP 110/68 | HR 75 | Temp 97.3°F | Resp 16 | Ht 63.0 in | Wt 140.2 lb

## 2019-07-08 DIAGNOSIS — N3944 Nocturnal enuresis: Secondary | ICD-10-CM

## 2019-07-08 DIAGNOSIS — R399 Unspecified symptoms and signs involving the genitourinary system: Secondary | ICD-10-CM

## 2019-07-08 DIAGNOSIS — Z23 Encounter for immunization: Secondary | ICD-10-CM | POA: Diagnosis not present

## 2019-07-08 LAB — POCT URINALYSIS DIPSTICK
Appearance: NORMAL
Bilirubin, UA: NEGATIVE
Blood, UA: NEGATIVE
Glucose, UA: NEGATIVE
Ketones, UA: NEGATIVE
Leukocytes, UA: NEGATIVE
Nitrite, UA: NEGATIVE
Odor: NORMAL
Protein, UA: NEGATIVE
Spec Grav, UA: 1.02 (ref 1.010–1.025)
Urobilinogen, UA: 0.2 E.U./dL
pH, UA: 6 (ref 5.0–8.0)

## 2019-07-08 NOTE — Progress Notes (Signed)
Name: Makayla Mason   MRN: [id number]    DOB: April 08, 1966   Date:07/08/2019       Progress Note  Subjective  Chief Complaint  Chief Complaint  Patient presents with  . urinary symptoms    urinary frequency on Sunday morning and some incontinence on Saturday night. Just one episode.    HPI  Nocturnal enuresis and urinary frequency: she states she had an accident during the night on Sunday and the following morning she had urinary frequency for a few hours, no dysuria, hematuria or back pain. She denies saddle anesthesia.   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 She has also skipped allergy shots. Advised not to take nsaid's or tylenol on a regular basis to avoid rebound headache, try to stay hydrated, cool down pack if needed, and discuss mouth guard with dentist   Patient Active Problem List   Diagnosis Date Noted  . Cervical radicular pain 07/15/2018  . Overweight (BMI 25.0-29.9) 01/29/2018  . Right hip pain 01/29/2018  . Insulin resistance 02/28/2017  . Low grade squamous intraepith lesion on cytologic smear cervix (lgsil) 02/28/2017  . GERD without esophagitis 03/15/2016  . Left ankle pain 03/15/2016  . Allergic rhinitis, seasonal 05/21/2015  . Scoliosis 05/21/2015  . 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 COLONOSCOPY WITH PROPOFOL N/A 02/15/2018   Procedure: COLONOSCOPY WITH PROPOFOL;  Surgeon: Jonathon Bellows, MD;  Location: [facility] ENDOSCOPY;  Service: Gastroenterology;  Laterality: N/A;    Family History  Problem Relation Age of Onset  . Breast cancer Cousin   . Diabetes Mother   . Diabetes Father   .  Hypertension Father   . Asthma Daughter   . Anemia Maternal Aunt   . Stroke Maternal Aunt   . Heart disease Maternal Grandmother   . Kidney disease Maternal Grandmother   . Hypertension Maternal Grandmother     Social History   Socioeconomic History  . Marital status: Divorced    Spouse name: Not on file  . Number of children: 2  . Years of education: Not on file  . Highest education level: Bachelor's degree (e.g., BA, AB, BS)  Occupational History  . Not on file  Social Needs  . Financial resource strain: Not hard at all  . Food insecurity    Worry: Never true    Inability: Never true  . Transportation needs    Medical: No    Non-medical: No  Tobacco Use  . Smoking status: Never Smoker  . Smokeless tobacco: Never Used  Substance and Sexual Activity  . Alcohol use: Yes    Alcohol/week: 0.0 standard drinks    Comment: occasional  . Drug use: No  . Sexual activity: Not Currently    Birth control/protection: Post-menopausal  Lifestyle  . Physical activity    Days per week: Not on file    Minutes per session: Not on file  . Stress: Not at all  Relationships  . Social connections    Talks on phone: More than three times a week    Gets together: More than three times a  week    Attends religious service: More than 4 times per year    Active member of club or organization: No    Attends meetings of clubs or organizations: Never    Relationship status: Divorced  . Intimate partner violence    Fear of current or ex partner: No    Emotionally abused: No    Physically abused: No    Forced sexual activity: No  Other Topics Concern  . Not on file  Social History Narrative   Lives in [place], grown daughters and grandson are at home     Current Outpatient Medications:  .  AUVI-Q 0.3 MG/0.3ML SOAJ injection, INJECT AS NEEDED FOR SEVERE ALLERGIC REACTION INCLUDING ANAPHYLAXIS AS DIRECTED, Disp: , Rfl: 1 .  diclofenac sodium (VOLTAREN) 1 % GEL, Apply 2 g topically 4  (four) times daily., Disp: 100 g, Rfl: 0 .  levocetirizine (XYZAL) 5 MG tablet, Take 1 tablet (5 mg total) by mouth every evening., Disp: 90 tablet, Rfl: 1 .  meloxicam (MOBIC) 15 MG tablet, Take 1 tablet (15 mg total) by mouth daily., Disp: 30 tablet, Rfl: 1 .  montelukast (SINGULAIR) 10 MG tablet, Take 1 tablet (10 mg total) by mouth at bedtime., Disp: 90 tablet, Rfl: 3 .  naproxen (NAPROSYN) 500 MG tablet, TAKE 1 TABLET BY MOUTH 2 TIMES DAILY WITH A MEAL., Disp: 30 tablet, Rfl: 0 .  omeprazole (PRILOSEC) 40 MG capsule, Take 1 capsule (40 mg total) by mouth daily., Disp: 90 capsule, Rfl: 1 .  triamcinolone (NASACORT ALLERGY 24HR) 55 MCG/ACT AERO nasal inhaler, Place 1 spray into the nose daily., Disp: 16.9 mL, Rfl: 3  Allergies  Allergen Reactions  . Codeine Nausea And Vomiting    I personally reviewed active problem list, medication list, allergies, family history, social history, health maintenance with the patient/caregiver today.   ROS  Constitutional: Negative for fever or weight change.  Respiratory: Negative for cough and shortness of breath.   Cardiovascular: Negative for chest pain or palpitations.  Gastrointestinal: Negative for abdominal pain, no bowel changes.  Musculoskeletal: Negative for gait problem or joint swelling.  Skin: Negative for rash.  Neurological: Negative for dizziness, positive for  headache.  No other specific complaints in a complete review of systems (except as listed in HPI above).  Objective  Vitals:   07/08/19 0929  BP: 110/68  Pulse: 75  Resp: 16  Temp: (!) 97.3 F (36.3 C)  TempSrc: Temporal  SpO2: 98%  Weight: 140 lb 3.2 oz (63.6 kg)  Height: 5\' 3"  (1.6 m)    Body mass index is 24.84 kg/m.  Physical Exam  Constitutional: Patient appears well-developed and well-nourished.  No distress.  HEENT: head atraumatic, normocephalic, pupils equal and reactive to light, ears: tm normal bilaterally, pain during abduction of left jaw   Cardiovascular: Normal rate, regular rhythm and normal heart sounds.  No murmur heard. No BLE edema. Pulmonary/Chest: Effort normal and breath sounds normal. No respiratory distress. Abdominal: Soft.  There is no tenderness. Psychiatric: Patient has a normal mood and affect. behavior is normal. Judgment and thought content normal.   Recent Results (from the past 2160 hour(s))  POCT HgB A1C     Status: Normal   Collection Time: 06/04/19 10:05 AM  Result Value Ref Range   Hemoglobin A1C     HbA1c POC (<> result, manual entry)     HbA1c, POC (prediabetic range)     HbA1c, POC (controlled diabetic range) 5.9 0.0 - 7.0 %  Cytology -  PAP     Status: None   Collection Time: 06/05/19 12:00 AM  Result Value Ref Range   Adequacy      Satisfactory for evaluation. The presence or absence of an endocervical / transformation zone component cannot be determined because of atrophy.   Diagnosis      NEGATIVE FOR INTRAEPITHELIAL LESIONS OR MALIGNANCY.   HPV NOT Detected     Comment: Normal Reference Range - NOT Detected   Material Submitted CervicoVaginal Pap [ThinPrep Imaged]    CYTOLOGY - PAP PAP RESULT   POCT Urinalysis Dipstick     Status: Normal   Collection Time: 07/08/19  9:35 AM  Result Value Ref Range   Color, UA Yellow    Clarity, UA Clear    Glucose, UA Negative Negative   Bilirubin, UA Negative    Ketones, UA Negative    Spec Grav, UA 1.020 1.010 - 1.025   Blood, UA Negative    pH, UA 6.0 5.0 - 8.0   Protein, UA Negative Negative   Urobilinogen, UA 0.2 0.2 or 1.0 E.U./dL   Nitrite, UA Negative    Leukocytes, UA Negative Negative   Appearance Normal    Odor normal      PHQ2/9: Depression screen [facility] 2/9 06/10/2019 06/04/2019 05/05/2019 01/20/2019 12/31/2018  Decreased Interest 0 0 0 0 0  Down, Depressed, Hopeless 0 0 0 0 0  PHQ - 2 Score 0 0 0 0 0  Altered sleeping 0 0 0 0 0  Tired, decreased energy 0 0 0 0 0  Change in appetite 0 0 0 0 0  Feeling bad or failure about  yourself  0 0 0 0 0  Trouble concentrating 0 0 0 0 0  Moving slowly or fidgety/restless 0 0 0 0 0  Suicidal thoughts 0 0 0 0 0  PHQ-9 Score 0 0 0 0 0  Difficult doing work/chores - Not difficult at all Not difficult at all Not difficult at all Not difficult at all  Some recent data might be hidden    phq 9 is negative   Fall Risk: Fall Risk  07/08/2019 06/10/2019 06/04/2019 06/04/2019 05/05/2019  Falls in the past year? 0 0 1 1 1   Number falls in past yr: 0 0 0 0 0  Injury with Fall? 0 0 1 1 1   Comment - - left ankle - -  Follow up - - - - -    Assessment & Plan   1. Urinary tract infection symptoms  - POCT Urinalysis Dipstick - CULTURE, URINE COMPREHENSIVE  2. Nocturnal enuresis  - CULTURE, URINE COMPREHENSIVE Call back if recurrence, if back pain or saddle anesthesia .   3. Need for immunization against influenza  - Flu Vaccine QUAD 36+ mos IM

## 2019-07-10 DIAGNOSIS — J301 Allergic rhinitis due to pollen: Secondary | ICD-10-CM | POA: Diagnosis not present

## 2019-07-10 LAB — CULTURE, URINE COMPREHENSIVE
MICRO NUMBER:: 836717
RESULT:: NO GROWTH
SPECIMEN QUALITY:: ADEQUATE

## 2019-07-11 ENCOUNTER — Encounter: Payer: 59 | Admitting: Obstetrics and Gynecology

## 2019-07-17 DIAGNOSIS — J301 Allergic rhinitis due to pollen: Secondary | ICD-10-CM | POA: Diagnosis not present

## 2019-07-24 DIAGNOSIS — J301 Allergic rhinitis due to pollen: Secondary | ICD-10-CM | POA: Diagnosis not present

## 2019-07-25 ENCOUNTER — Encounter: Payer: Self-pay | Admitting: Podiatry

## 2019-07-25 ENCOUNTER — Ambulatory Visit (INDEPENDENT_AMBULATORY_CARE_PROVIDER_SITE_OTHER): Payer: 59 | Admitting: Podiatry

## 2019-07-25 ENCOUNTER — Other Ambulatory Visit: Payer: Self-pay

## 2019-07-25 DIAGNOSIS — M659 Synovitis and tenosynovitis, unspecified: Secondary | ICD-10-CM | POA: Diagnosis not present

## 2019-07-27 NOTE — Progress Notes (Signed)
   Subjective:  53 y.o. female presenting today for follow up evaluation of left ankle pain. She states she was doing well until the pain returned yesterday. The injection helped to alleviate the pain until this point. She has been taking Meloxicam and using Voltaren gel as directed. Walking and standing for long periods of time increases the pain. Patient is here for further evaluation and treatment.   Past Medical History:  Diagnosis Date  . Allergic rhinitis, seasonal   . Mild scoliosis   . TMJ arthralgia    right side     Objective / Physical Exam:  General:  The patient is alert and oriented x3 in no acute distress. Dermatology:  Skin is warm, dry and supple bilateral lower extremities. Negative for open lesions or macerations. Vascular:  Palpable pedal pulses bilaterally. No edema or erythema noted. Capillary refill within normal limits. Neurological:  Epicritic and protective threshold grossly intact bilaterally.  Musculoskeletal Exam:  Pain on palpation to the anterior lateral medial aspects of the patient's left ankle. Mild edema noted. Range of motion within normal limits to all pedal and ankle joints bilateral. Muscle strength 5/5 in all groups bilateral.   Assessment: 1. Ankle synovitis left / medial deltoid ligament sprain - improved   Plan of Care:  1. Patient was evaluated.  2. Injection of 0.5 mL Celestone Soluspan injected in the patient's left ankle. 3. Continue taking Meloxicam as needed.  4. Continue using Voltaren 2% topical cream as needed.  5. Return to clinic as needed.    Works at [employer] at [employer].    Edrick Kins, DPM [facility]  Dr. Edrick Kins, [address]                                        [place], [place] [address]                Office [phone]  Fax [phone]

## 2019-07-30 DIAGNOSIS — J301 Allergic rhinitis due to pollen: Secondary | ICD-10-CM | POA: Diagnosis not present

## 2019-08-07 DIAGNOSIS — J301 Allergic rhinitis due to pollen: Secondary | ICD-10-CM | POA: Diagnosis not present

## 2019-08-08 DIAGNOSIS — J301 Allergic rhinitis due to pollen: Secondary | ICD-10-CM | POA: Diagnosis not present

## 2019-11-14 DIAGNOSIS — Z23 Encounter for immunization: Secondary | ICD-10-CM | POA: Diagnosis not present

## 2019-11-17 DIAGNOSIS — H5203 Hypermetropia, bilateral: Secondary | ICD-10-CM | POA: Diagnosis not present

## 2019-11-17 DIAGNOSIS — H40003 Preglaucoma, unspecified, bilateral: Secondary | ICD-10-CM | POA: Diagnosis not present

## 2019-11-17 DIAGNOSIS — H1045 Other chronic allergic conjunctivitis: Secondary | ICD-10-CM | POA: Diagnosis not present

## 2019-12-05 ENCOUNTER — Encounter: Payer: Self-pay | Admitting: Family Medicine

## 2019-12-05 ENCOUNTER — Other Ambulatory Visit: Payer: Self-pay

## 2019-12-05 ENCOUNTER — Ambulatory Visit (INDEPENDENT_AMBULATORY_CARE_PROVIDER_SITE_OTHER): Payer: 59 | Admitting: Family Medicine

## 2019-12-05 VITALS — BP 100/70 | HR 82 | Temp 97.3°F | Resp 16 | Ht 63.0 in | Wt 142.6 lb

## 2019-12-05 DIAGNOSIS — J302 Other seasonal allergic rhinitis: Secondary | ICD-10-CM

## 2019-12-05 DIAGNOSIS — M1611 Unilateral primary osteoarthritis, right hip: Secondary | ICD-10-CM | POA: Diagnosis not present

## 2019-12-05 DIAGNOSIS — M1711 Unilateral primary osteoarthritis, right knee: Secondary | ICD-10-CM | POA: Diagnosis not present

## 2019-12-05 DIAGNOSIS — J3089 Other allergic rhinitis: Secondary | ICD-10-CM | POA: Diagnosis not present

## 2019-12-05 DIAGNOSIS — K219 Gastro-esophageal reflux disease without esophagitis: Secondary | ICD-10-CM

## 2019-12-05 DIAGNOSIS — R7303 Prediabetes: Secondary | ICD-10-CM

## 2019-12-05 LAB — POCT GLYCOSYLATED HEMOGLOBIN (HGB A1C): Hemoglobin A1C: 5.4 % (ref 4.0–5.6)

## 2019-12-05 MED ORDER — PREGABALIN 50 MG PO CAPS: 50.0000 mg | ORAL_CAPSULE | Freq: Every day | ORAL | 0 refills | Status: DC

## 2019-12-05 MED ORDER — MONTELUKAST SODIUM 10 MG PO TABS: 10.0000 mg | ORAL_TABLET | Freq: Every day | ORAL | 1 refills | Status: DC

## 2019-12-05 MED ORDER — NAPROXEN 500 MG PO TABS: 500.0000 mg | ORAL_TABLET | Freq: Two times a day (BID) | ORAL | 0 refills | Status: DC

## 2019-12-05 MED ORDER — LEVOCETIRIZINE DIHYDROCHLORIDE 5 MG PO TABS: 5.0000 mg | ORAL_TABLET | Freq: Every evening | ORAL | 1 refills | Status: DC

## 2019-12-05 NOTE — Progress Notes (Signed)
Name: Makayla Mason   MRN: [id number]    DOB: 1966/08/06   Date:12/05/2019       Progress Note  Subjective  Chief Complaint  Chief Complaint  Patient presents with  . Hip Pain    right hip pain x 3 months that is constant aching pain. She has stiffness when she gets up from sitting for awhile.  . Follow-up    6 month follow up.    HPI  Insulin Resistance: She denies polyphagia, polyuria or polyphagia.Last A1C was down to 5.9%. She tries to avoid sweets   GERD: she takes Omeprazole daily, and symptoms are controlled, no heartburn or regurgitation, avoiding fried food and spicy food.She is aware of long term medication use, however she has been taking medication prn and is doing well   Perennial AR:she started allergy shots Fall 2019 with ENT, sees Dr. Ladene Artist , she took a pause on allergy shots since symptoms under control. 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 Sid Falcon 2019  and had normal pap smear with positive HPV, ad repeat pap smear 05/2019 and it was normal   TMJ: pain when she yawns and sometimes when she gets up, she is avoiding chewing gum, and is doing well  OA right knee and hip: seen by Dr. Rudene Christians in the past and had X-rays, she states pain has been getting progressively worse, advised to avoid nsaid's we can try adding Lyrica at night since symptoms have been worse at night. She states right knee her some popping sounds, no redness or swelling.   Patient Active Problem List   Diagnosis Date Noted  . Cervical radicular pain 07/15/2018  . Overweight (BMI 25.0-29.9) 01/29/2018  . Right hip pain 01/29/2018  . Insulin resistance 02/28/2017  . Low grade squamous intraepith lesion on cytologic smear cervix (lgsil) 02/28/2017  . GERD without esophagitis 03/15/2016  . Left ankle pain 03/15/2016  . Allergic rhinitis, seasonal 05/21/2015  . Scoliosis 05/21/2015  . Arthralgia of temporomandibular joint  05/21/2015    Past Surgical History:  Procedure Laterality Date  . COLONOSCOPY WITH PROPOFOL N/A 02/15/2018   Procedure: COLONOSCOPY WITH PROPOFOL;  Surgeon: Jonathon Bellows, MD;  Location: [facility] ENDOSCOPY;  Service: Gastroenterology;  Laterality: N/A;    Family History  Problem Relation Age of Onset  . Breast cancer Cousin   . Diabetes Mother   . Diabetes Father   . Hypertension Father   . Asthma Daughter   . Anemia Maternal Aunt   . Stroke Maternal Aunt   . Heart disease Maternal Grandmother   . Kidney disease Maternal Grandmother   . Hypertension Maternal Grandmother      Current Outpatient Medications:  .  AUVI-Q 0.3 MG/0.3ML SOAJ injection, INJECT AS NEEDED FOR SEVERE ALLERGIC REACTION INCLUDING ANAPHYLAXIS AS DIRECTED, Disp: , Rfl: 1 .  levocetirizine (XYZAL) 5 MG tablet, Take 1 tablet (5 mg total) by mouth every evening., Disp: 90 tablet, Rfl: 1 .  montelukast (SINGULAIR) 10 MG tablet, Take 1 tablet (10 mg total) by mouth at bedtime., Disp: 90 tablet, Rfl: 1 .  naproxen (NAPROSYN) 500 MG tablet, Take 1 tablet (500 mg total) by mouth 2 (two) times daily with a meal., Disp: 60 tablet, Rfl: 0 .  omeprazole (PRILOSEC) 40 MG capsule, Take 1 capsule (40 mg total) by mouth daily., Disp: 90 capsule, Rfl: 1 .  triamcinolone (NASACORT ALLERGY 24HR) 55 MCG/ACT AERO nasal inhaler, Place 1 spray  into the nose daily., Disp: 16.9 mL, Rfl: 3 .  pregabalin (LYRICA) 50 MG capsule, Take 1-2 capsules (50-100 mg total) by mouth at bedtime., Disp: 60 capsule, Rfl: 0  Allergies  Allergen Reactions  . Codeine Nausea And Vomiting    I personally reviewed active problem list, medication list, allergies, family history, social history with the patient/caregiver today.   ROS  Constitutional: Negative for fever or weight change.  Respiratory: Negative for cough and shortness of breath.   Cardiovascular: Negative for chest pain or palpitations.  Gastrointestinal: Negative for abdominal pain, no  bowel changes.  Musculoskeletal: Positive for intermittent  gait problem but no  joint swelling.  Skin: Negative for rash.  Neurological: Negative for dizziness or headache.  No other specific complaints in a complete review of systems (except as listed in HPI above).  Objective  Vitals:   12/05/19 1016 12/05/19 1018  BP:  100/70  Pulse: 82 82  Resp: 16 16  Temp: (!) 97.3 F (36.3 C) (!) 97.3 F (36.3 C)  TempSrc: Temporal Temporal  SpO2: 98% 98%  Weight: 142 lb 9.6 oz (64.7 kg) 142 lb 9.6 oz (64.7 kg)  Height: 5\' 3"  (1.6 m) 5\' 3"  (1.6 m)    Body mass index is 25.26 kg/m.  Physical Exam  Constitutional: Patient appears well-developed and well-nourished. Overweight. No distress.  HEENT: head atraumatic, normocephalic, pupils equal and reactive to light Cardiovascular: Normal rate, regular rhythm and normal heart sounds.  No murmur heard. No BLE edema. Pulmonary/Chest: Effort normal and breath sounds normal. No respiratory distress. Abdominal: Soft.  There is no tenderness. Muscular Skeletal: normal rom of right knee, grinding during extension of both knees, no effusion, pain during internal rotation of right hip  Psychiatric: Patient has a normal mood and affect. behavior is normal. Judgment and thought content normal.  PHQ2/9: Depression screen [facility] 2/9 12/05/2019 06/10/2019 06/04/2019 05/05/2019 01/20/2019  Decreased Interest 0 0 0 0 0  Down, Depressed, Hopeless 0 0 0 0 0  PHQ - 2 Score 0 0 0 0 0  Altered sleeping 0 0 0 0 0  Tired, decreased energy 0 0 0 0 0  Change in appetite 0 0 0 0 0  Feeling bad or failure about yourself  0 0 0 0 0  Trouble concentrating 0 0 0 0 0  Moving slowly or fidgety/restless 0 0 0 0 0  Suicidal thoughts 0 0 0 0 0  PHQ-9 Score 0 0 0 0 0  Difficult doing work/chores - - Not difficult at all Not difficult at all Not difficult at all  Some recent data might be hidden    phq 9 is negative  Fall Risk: Fall Risk  12/05/2019 07/08/2019 06/10/2019  06/04/2019 06/04/2019  Falls in the past year? 0 0 0 1 1  Number falls in past yr: 0 0 0 0 0  Injury with Fall? 0 0 0 1 1  Comment - - - left ankle -  Follow up - - - - -    Functional Status Survey: Is the patient deaf or have difficulty hearing?: No Does the patient have difficulty seeing, even when wearing glasses/contacts?: No Does the patient have difficulty concentrating, remembering, or making decisions?: No Does the patient have difficulty walking or climbing stairs?: No Does the patient have difficulty dressing or bathing?: No Does the patient have difficulty doing errands alone such as visiting a doctor's office or shopping?: No   Assessment & Plan  1. Primary osteoarthritis of right  hip  - naproxen (NAPROSYN) 500 MG tablet; Take 1 tablet (500 mg total) by mouth 2 (two) times daily with a meal.  Dispense: 60 tablet; Refill: 0 - pregabalin (LYRICA) 50 MG capsule; Take 1-2 capsules (50-100 mg total) by mouth at bedtime.  Dispense: 60 capsule; Refill: 0  2. Primary osteoarthritis of right knee  - naproxen (NAPROSYN) 500 MG tablet; Take 1 tablet (500 mg total) by mouth 2 (two) times daily with a meal.  Dispense: 60 tablet; Refill: 0 - pregabalin (LYRICA) 50 MG capsule; Take 1-2 capsules (50-100 mg total) by mouth at bedtime.  Dispense: 60 capsule; Refill: 0  3. Prediabetes  - POCT glycosylated hemoglobin (Hb A1C)  4. GERD without esophagitis  Continue prn medication  5. Perennial allergic rhinitis with seasonal variation  - montelukast (SINGULAIR) 10 MG tablet; Take 1 tablet (10 mg total) by mouth at bedtime.  Dispense: 90 tablet; Refill: 1 - levocetirizine (XYZAL) 5 MG tablet; Take 1 tablet (5 mg total) by mouth every evening.  Dispense: 90 tablet; Refill: 1

## 2019-12-12 DIAGNOSIS — Z23 Encounter for immunization: Secondary | ICD-10-CM | POA: Diagnosis not present

## 2020-01-15 ENCOUNTER — Other Ambulatory Visit: Payer: Self-pay | Admitting: Family Medicine

## 2020-01-15 DIAGNOSIS — Z1231 Encounter for screening mammogram for malignant neoplasm of breast: Secondary | ICD-10-CM

## 2020-01-16 DIAGNOSIS — J301 Allergic rhinitis due to pollen: Secondary | ICD-10-CM | POA: Diagnosis not present

## 2020-01-19 ENCOUNTER — Other Ambulatory Visit: Payer: Self-pay | Admitting: Family Medicine

## 2020-01-19 DIAGNOSIS — J302 Other seasonal allergic rhinitis: Secondary | ICD-10-CM

## 2020-01-19 DIAGNOSIS — J301 Allergic rhinitis due to pollen: Secondary | ICD-10-CM | POA: Diagnosis not present

## 2020-01-23 ENCOUNTER — Telehealth: Payer: Self-pay | Admitting: Podiatry

## 2020-01-23 NOTE — Telephone Encounter (Signed)
I'm calling you about a bill that you sent me for $33. I think I threw it away because I just called the [facility] in [place] and I couldn't pay it over the phone so she gave me this number to call you where I could pay it over the telephone. If you could give me a call back at [phone] so I can get this paid, I would thank you very much. Bye bye.

## 2020-01-26 DIAGNOSIS — J301 Allergic rhinitis due to pollen: Secondary | ICD-10-CM | POA: Diagnosis not present

## 2020-02-02 IMAGING — MG MM DIGITAL SCREENING BILAT W/ TOMO W/ CAD
8 series · 8 of 24 positions shown · non-contrast
Comparison: Previous exam(s).

CLINICAL DATA: Screening.

EXAM:
DIGITAL SCREENING BILATERAL MAMMOGRAM WITH TOMO AND CAD

[R CC synth-2D]
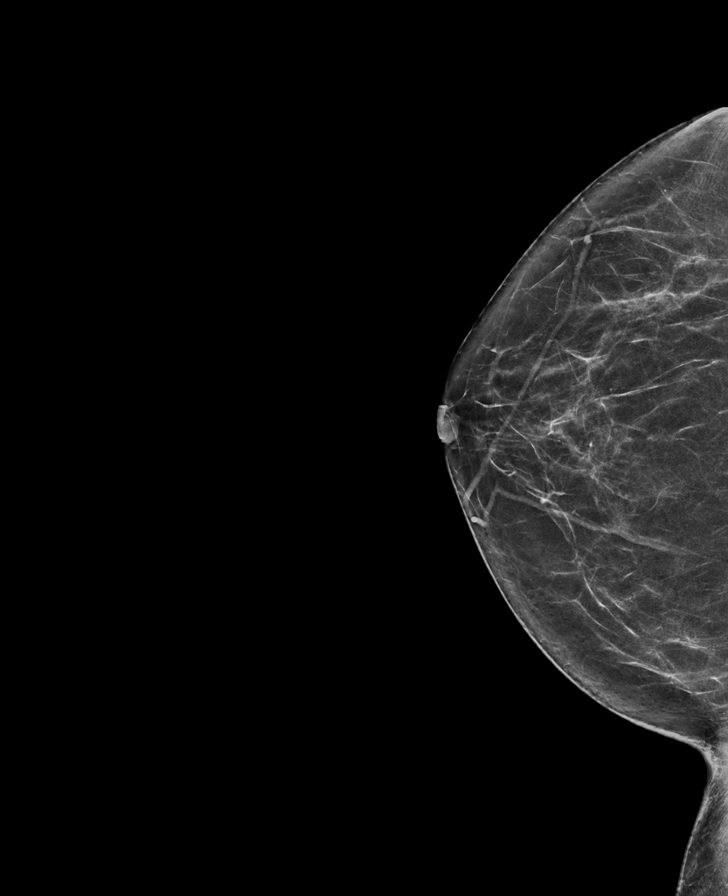

[R MLO synth-2D]
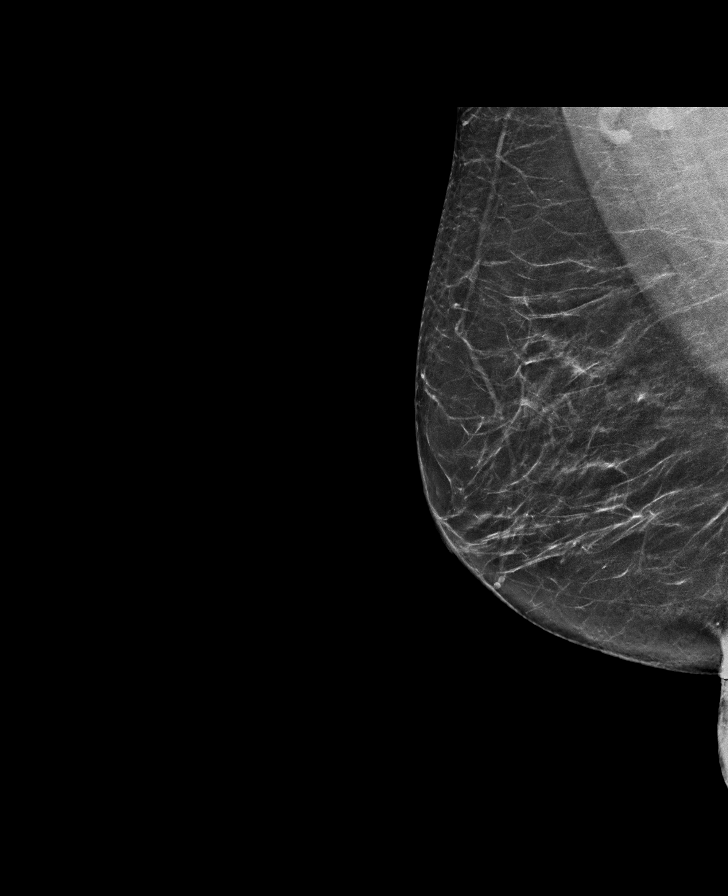

[L MLO synth-2D]
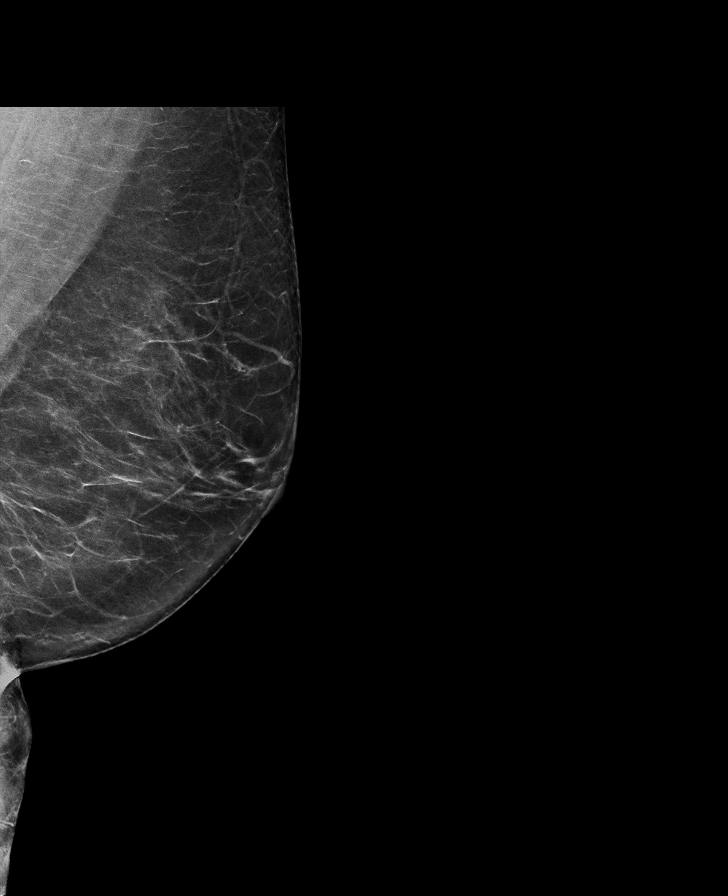

[L CC synth-2D]
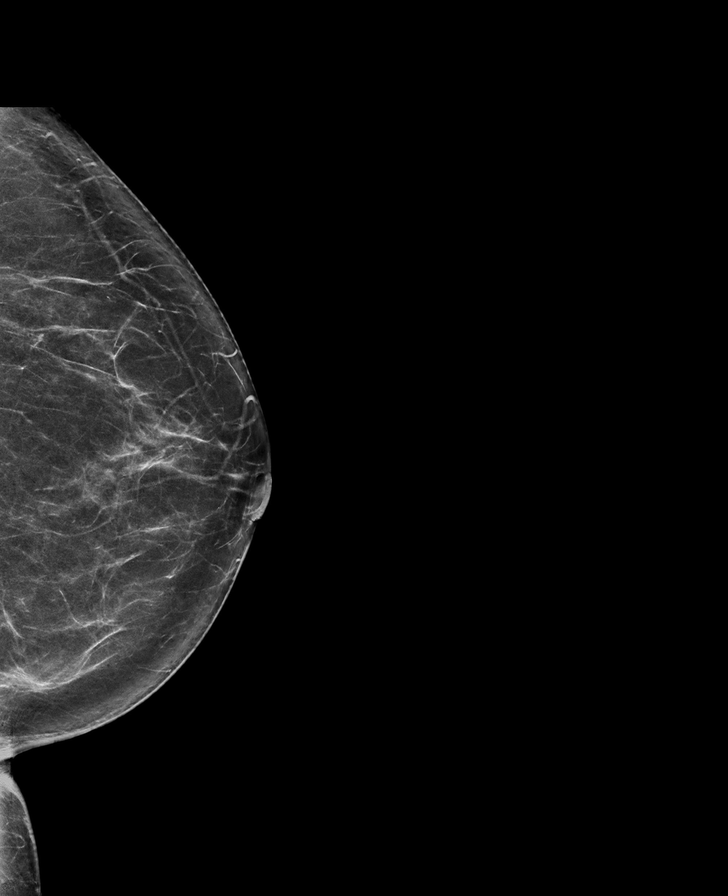

[L MLO tomo · tomo slice 39/78.0]
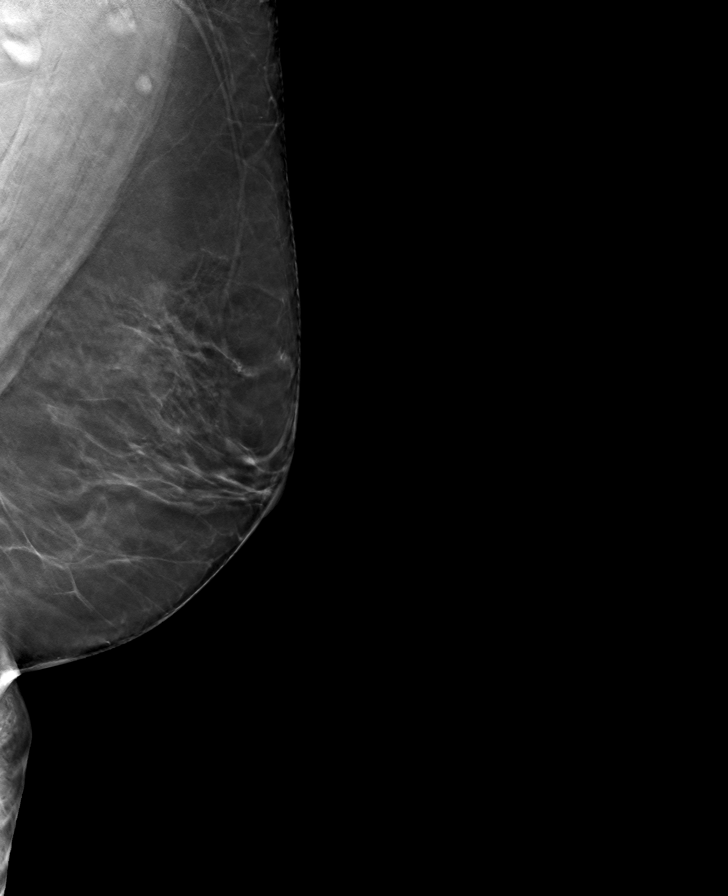

[R CC tomo · tomo slice 34/67.0]
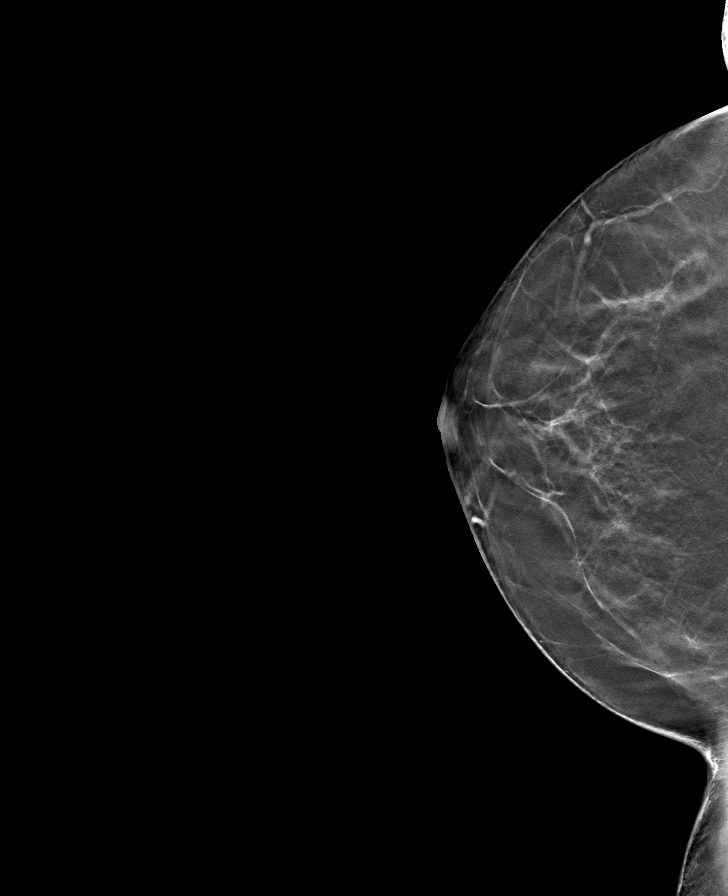

[L CC tomo · tomo slice 37/73.0]
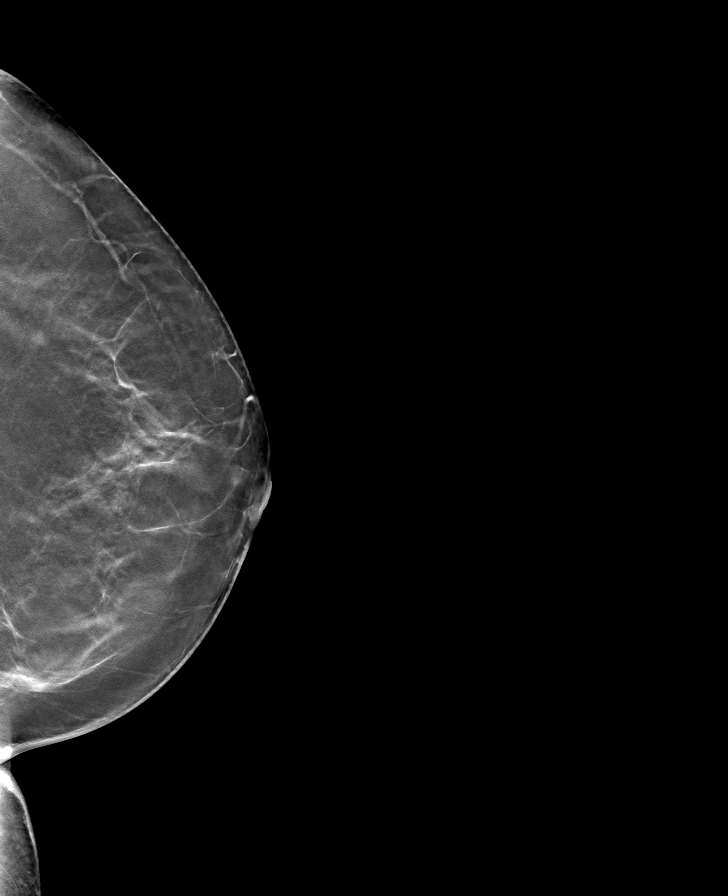

[R MLO tomo · tomo slice 37/72.0]
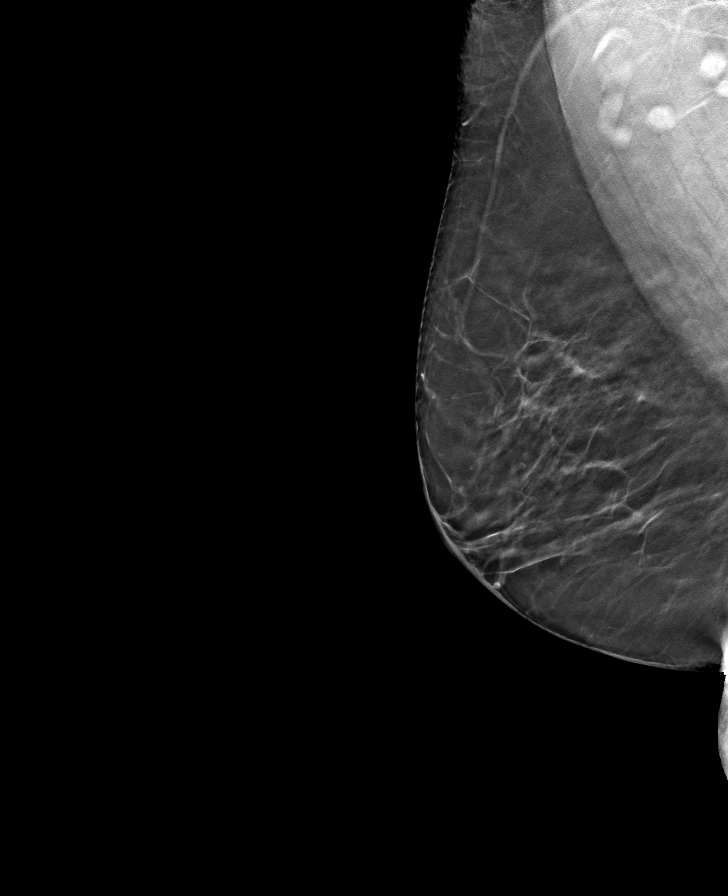

[8 of 24 positions shown; findings below may reference images not displayed]

ACR Breast Density Category b: There are scattered areas of
fibroglandular density.
FINDINGS: There are no findings suspicious for malignancy. Images were
processed with CAD.
IMPRESSION: No mammographic evidence of malignancy. A result letter of this
screening mammogram will be mailed directly to the patient.

RECOMMENDATION:
Screening mammogram in one year. (Code:[id number])

BI-RADS CATEGORY  1: Negative.

## 2020-02-03 ENCOUNTER — Other Ambulatory Visit: Payer: Self-pay

## 2020-02-03 ENCOUNTER — Ambulatory Visit (INDEPENDENT_AMBULATORY_CARE_PROVIDER_SITE_OTHER): Payer: 59 | Admitting: Podiatry

## 2020-02-03 DIAGNOSIS — M659 Synovitis and tenosynovitis, unspecified: Secondary | ICD-10-CM

## 2020-02-03 DIAGNOSIS — S93402A Sprain of unspecified ligament of left ankle, initial encounter: Secondary | ICD-10-CM

## 2020-02-03 MED ORDER — MELOXICAM 15 MG PO TABS: 15.0000 mg | ORAL_TABLET | Freq: Every day | ORAL | 1 refills | Status: DC

## 2020-02-04 DIAGNOSIS — J301 Allergic rhinitis due to pollen: Secondary | ICD-10-CM | POA: Diagnosis not present

## 2020-02-04 NOTE — Progress Notes (Signed)
   Subjective:  54 y.o. female presenting today for follow up evaluation of left ankle pain. She states the pain started flaring up about two weeks ago. She reports associated swelling. She has been taking Tylenol and Naproxen for treatment. Walking and being ambulatory increases the pain. She states injections have helped in the past and wants another. Patient is here for further evaluation and treatment.   Past Medical History:  Diagnosis Date  . Allergic rhinitis, seasonal   . Mild scoliosis   . TMJ arthralgia    right side     Objective / Physical Exam:  General:  The patient is alert and oriented x3 in no acute distress. Dermatology:  Skin is warm, dry and supple bilateral lower extremities. Negative for open lesions or macerations. Vascular:  Palpable pedal pulses bilaterally. No edema or erythema noted. Capillary refill within normal limits. Neurological:  Epicritic and protective threshold grossly intact bilaterally.  Musculoskeletal Exam:  Pain on palpation to the anterior lateral medial aspects of the patient's left ankle. Mild edema noted. Range of motion within normal limits to all pedal and ankle joints bilateral. Muscle strength 5/5 in all groups bilateral.   Assessment: 1. Ankle synovitis left / medial deltoid ligament sprain - improved   Plan of Care:  1. Patient was evaluated.  2. Injection of 0.5 mL Celestone Soluspan injected in the patient's left ankle. 3. Refill prescription for Meloxicam provided to patient.   4. OTC insoles provided to patient.  5. Resume using ankle brace.  6. Return to clinic as needed.     Works at [employer] at [employer].    Edrick Kins, DPM [facility]  Dr. Edrick Kins, [address]                                        [place], [place] [address]                Office [phone]  Fax [phone]

## 2020-02-06 DIAGNOSIS — M1611 Unilateral primary osteoarthritis, right hip: Secondary | ICD-10-CM | POA: Diagnosis not present

## 2020-02-06 DIAGNOSIS — M25551 Pain in right hip: Secondary | ICD-10-CM | POA: Diagnosis not present

## 2020-02-09 DIAGNOSIS — J301 Allergic rhinitis due to pollen: Secondary | ICD-10-CM | POA: Diagnosis not present

## 2020-02-16 DIAGNOSIS — J301 Allergic rhinitis due to pollen: Secondary | ICD-10-CM | POA: Diagnosis not present

## 2020-02-23 DIAGNOSIS — J301 Allergic rhinitis due to pollen: Secondary | ICD-10-CM | POA: Diagnosis not present

## 2020-03-01 DIAGNOSIS — J301 Allergic rhinitis due to pollen: Secondary | ICD-10-CM | POA: Diagnosis not present

## 2020-03-08 DIAGNOSIS — J301 Allergic rhinitis due to pollen: Secondary | ICD-10-CM | POA: Diagnosis not present

## 2020-03-15 DIAGNOSIS — J301 Allergic rhinitis due to pollen: Secondary | ICD-10-CM | POA: Diagnosis not present

## 2020-03-22 DIAGNOSIS — J301 Allergic rhinitis due to pollen: Secondary | ICD-10-CM | POA: Diagnosis not present

## 2020-03-29 DIAGNOSIS — J301 Allergic rhinitis due to pollen: Secondary | ICD-10-CM | POA: Diagnosis not present

## 2020-04-09 DIAGNOSIS — J301 Allergic rhinitis due to pollen: Secondary | ICD-10-CM | POA: Diagnosis not present

## 2020-04-12 DIAGNOSIS — J301 Allergic rhinitis due to pollen: Secondary | ICD-10-CM | POA: Diagnosis not present

## 2020-04-19 DIAGNOSIS — J301 Allergic rhinitis due to pollen: Secondary | ICD-10-CM | POA: Diagnosis not present

## 2020-04-26 DIAGNOSIS — J301 Allergic rhinitis due to pollen: Secondary | ICD-10-CM | POA: Diagnosis not present

## 2020-04-27 ENCOUNTER — Other Ambulatory Visit: Payer: Self-pay

## 2020-04-27 ENCOUNTER — Ambulatory Visit (INDEPENDENT_AMBULATORY_CARE_PROVIDER_SITE_OTHER): Payer: 59 | Admitting: Podiatry

## 2020-04-27 ENCOUNTER — Encounter: Payer: Self-pay | Admitting: Podiatry

## 2020-04-27 DIAGNOSIS — M659 Synovitis and tenosynovitis, unspecified: Secondary | ICD-10-CM

## 2020-04-27 DIAGNOSIS — M76822 Posterior tibial tendinitis, left leg: Secondary | ICD-10-CM

## 2020-04-27 DIAGNOSIS — M65172 Other infective (teno)synovitis, left ankle and foot: Secondary | ICD-10-CM | POA: Diagnosis not present

## 2020-04-27 DIAGNOSIS — S93402A Sprain of unspecified ligament of left ankle, initial encounter: Secondary | ICD-10-CM

## 2020-04-27 MED ORDER — MELOXICAM 15 MG PO TABS: 15.0000 mg | ORAL_TABLET | Freq: Every day | ORAL | 1 refills | Status: DC

## 2020-04-27 NOTE — Progress Notes (Signed)
   Subjective:  54 y.o. female presenting today for follow up evaluation of left ankle pain.  Patient was last seen in the office on 02/03/2020.  Patient states that the injection she received last visit helped significantly for a few weeks.  She has also been taking the meloxicam as prescribed.  Last visit she did receive some OTC insoles here in the office but she has not worn them.  She continues to have pain and swelling in the left foot and ankle.  It is very bad by the end of the evening.  She presents for further treatment and evaluation  Past Medical History:  Diagnosis Date  . Allergic rhinitis, seasonal   . Mild scoliosis   . TMJ arthralgia    right side     Objective / Physical Exam:  General:  The patient is alert and oriented x3 in no acute distress. Dermatology:  Skin is warm, dry and supple bilateral lower extremities. Negative for open lesions or macerations. Vascular:  Palpable pedal pulses bilaterally. No edema or erythema noted. Capillary refill within normal limits. Neurological:  Epicritic and protective threshold grossly intact bilaterally.  Musculoskeletal Exam:  Pain on palpation to the anterior lateral medial aspects of the patient's left ankle. Mild edema noted. Range of motion within normal limits to all pedal and ankle joints bilateral. Muscle strength 5/5 in all groups bilateral.  Medial longitudinal arch collapse also noted consistent with a pes planus deformity.  There is pain today along the posterior tibial tendon as well.  Assessment: 1. Ankle synovitis left  2.  Posterior tibial tendinitis left 3.  Posterior tibial tendon dysfunction left 4.  Pes planus left  Plan of Care:  1. Patient was evaluated.  2. Injection of 0.5 mL Celestone Soluspan injected in the patient's left ankle as she is more symptomatic in the ankle versus along the posterior tibial tendon. 3. Refill prescription for Meloxicam provided to patient.   4.  Stressed the importance of  wearing arch supports and her OTC insoles that were provided last visit.  Patient has not been wearing the insoles that were provided.  5.  Return to clinic in 4 weeks.  If the patient does not see much improvement with wearing the insoles we will order MRI  Works at [employer] at [place].    Edrick Kins, DPM [facility]  Dr. Edrick Kins, [place]                                        [place], [place] [address]                Office [phone]  Fax [phone]

## 2020-05-04 ENCOUNTER — Ambulatory Visit (INDEPENDENT_AMBULATORY_CARE_PROVIDER_SITE_OTHER): Payer: 59 | Admitting: Family Medicine

## 2020-05-04 ENCOUNTER — Other Ambulatory Visit: Payer: Self-pay

## 2020-05-04 ENCOUNTER — Encounter: Payer: Self-pay | Admitting: Family Medicine

## 2020-05-04 VITALS — BP 120/80 | HR 84 | Temp 97.5°F | Resp 16 | Ht 62.25 in | Wt 144.4 lb

## 2020-05-04 DIAGNOSIS — Z0000 Encounter for general adult medical examination without abnormal findings: Secondary | ICD-10-CM | POA: Diagnosis not present

## 2020-05-04 NOTE — Progress Notes (Signed)
Name: Makayla Mason   MRN: [id number]    DOB: 1966/01/04   Date:05/04/2020       Progress Note  Subjective  Chief Complaint  Chief Complaint  Patient presents with   Annual Exam    HPI  Patient presents for annual CPE.  Diet: balanced Exercise: on weekends with grandson   USPSTF grade A and B recommendations    Office Visit from 06/04/2019 in [facility]  AUDIT-C Score 1     Depression: Phq 9 is  negative Depression screen [facility] 2/9 05/04/2020 12/05/2019 06/10/2019 06/04/2019 05/05/2019  Decreased Interest 0 0 0 0 0  Down, Depressed, Hopeless 0 0 0 0 0  PHQ - 2 Score 0 0 0 0 0  Altered sleeping 0 0 0 0 0  Tired, decreased energy 0 0 0 0 0  Change in appetite 0 0 0 0 0  Feeling bad or failure about yourself  0 0 0 0 0  Trouble concentrating 0 0 0 0 0  Moving slowly or fidgety/restless 0 0 0 0 0  Suicidal thoughts 0 0 0 0 0  PHQ-9 Score 0 0 0 0 0  Difficult doing work/chores Not difficult at all - - Not difficult at all Not difficult at all  Some recent data might be hidden   Hypertension: BP Readings from Last 3 Encounters:  05/04/20 120/80  12/05/19 100/70  07/08/19 110/68   Obesity: Wt Readings from Last 3 Encounters:  05/04/20 144 lb 6.4 oz (65.5 kg)  12/05/19 142 lb 9.6 oz (64.7 kg)  07/08/19 140 lb 3.2 oz (63.6 kg)   BMI Readings from Last 3 Encounters:  05/04/20 26.20 kg/m  12/05/19 25.26 kg/m  07/08/19 24.84 kg/m     Hep C Screening: negative 2017  STD testing and prevention (HIV/chl/gon/syphilis): not currently sexually active, it has been years  Intimate partner violence: negative screen  Sexual History (Partners/Practices/Protection from [employer] hx STI/Pregnancy Plans): not currently sexually active Menstrual History/LMP/Abnormal Bleeding:  Discussed post-menopausal bleeding  Incontinence Symptoms: no problems   Breast cancer:  - Last Mammogram: scheduled for July 2021  - BRCA gene screening: N/A  Osteoporosis: Discussed  high calcium and vitamin D supplementation, weight bearing exercises  Cervical cancer screening: 06/05/2019   Skin cancer: Discussed monitoring for atypical lesions  Colorectal cancer: repeat in 2029    Lung cancer:   Low Dose CT Chest recommended if Age 56-80 years, 30 pack-year currently smoking OR have quit w/in 15years. Patient does not qualify.     Advanced Care Planning: A voluntary discussion about advance care planning including the explanation and discussion of advance directives.  Discussed health care proxy and Living will, and the patient was able to identify a health care proxy as [name] - daughter .  Patient does not have a living will at present time.  Lipids: Lab Results  Component Value Date   CHOL 181 12/04/2018   CHOL 164 01/29/2018   CHOL 168 02/01/2017   Lab Results  Component Value Date   HDL 53 12/04/2018   HDL 51 01/29/2018   HDL 55 02/01/2017   Lab Results  Component Value Date   LDLCALC 107 (H) 12/04/2018   LDLCALC 88 01/29/2018   LDLCALC 94 02/01/2017   Lab Results  Component Value Date   TRIG 114 12/04/2018   TRIG 145 01/29/2018   TRIG 94 02/01/2017   Lab Results  Component Value Date   CHOLHDL 3.4 12/04/2018   CHOLHDL 3.2 01/29/2018  CHOLHDL 3.1 02/01/2017   No results found for: LDLDIRECT  Glucose: Glucose, Bld  Date Value Ref Range Status  12/04/2018 82 65 - 99 mg/dL Final    Comment:    .            Fasting reference interval .   01/29/2018 75 65 - 99 mg/dL Final    Comment:    .            Fasting reference interval .   02/01/2017 97 65 - 99 mg/dL Final    Patient Active Problem List   Diagnosis Date Noted   Cervical radicular pain 07/15/2018   Overweight (BMI 25.0-29.9) 01/29/2018   Right hip pain 01/29/2018   Insulin resistance 02/28/2017   Low grade squamous intraepith lesion on cytologic smear cervix (lgsil) 02/28/2017   GERD without esophagitis 03/15/2016   Left ankle pain 03/15/2016   Allergic  rhinitis, seasonal 05/21/2015   Scoliosis 05/21/2015   Arthralgia of temporomandibular joint 05/21/2015    Past Surgical History:  Procedure Laterality Date   COLONOSCOPY WITH PROPOFOL N/A 02/15/2018   Procedure: COLONOSCOPY WITH PROPOFOL;  Surgeon: Jonathon Bellows, MD;  Location: [facility] ENDOSCOPY;  Service: Gastroenterology;  Laterality: N/A;    Family History  Problem Relation Age of Onset   Breast cancer Cousin    Diabetes Mother    Diabetes Father    Hypertension Father    Asthma Daughter    Anemia Maternal Aunt    Stroke Maternal Aunt    Heart disease Maternal Grandmother    Kidney disease Maternal Grandmother    Hypertension Maternal Grandmother     Social History   Socioeconomic History   Marital status: Divorced    Spouse name: Not on file   Number of children: 2   Years of education: Not on file   Highest education level: Bachelor's degree (e.g., BA, AB, BS)  Occupational History   Not on file  Tobacco Use   Smoking status: Never Smoker   Smokeless tobacco: Never Used  Vaping Use   Vaping Use: Never used  Substance and Sexual Activity   Alcohol use: Yes    Alcohol/week: 0.0 standard drinks    Comment: occasional   Drug use: No   Sexual activity: Not Currently    Birth control/protection: Post-menopausal  Other Topics Concern   Not on file  Social History Narrative   Lives in [place], grown daughters and grandson are at home   Social Determinants of Health   Financial Resource Strain: Low Risk    Difficulty of Paying Living Expenses: Not hard at all  Food Insecurity: No Food Insecurity   Worried About Charity fundraiser in the Last Year: Never true   Arboriculturist in the Last Year: Never true  Transportation Needs: No Transportation Needs   Lack of Transportation (Medical): No   Lack of Transportation (Non-Medical): No  Physical Activity: Sufficiently Active   Days of Exercise per Week: 2 days   Minutes of Exercise  per Session: 120 min  Stress: No Stress Concern Present   Feeling of Stress : Not at all  Social Connections: Moderately Integrated   Frequency of Communication with Friends and Family: More than three times a week   Frequency of Social Gatherings with Friends and Family: More than three times a week   Attends Religious Services: More than 4 times per year   Active Member of [employer] or Organizations: Yes   Attends Archivist Meetings: More  than 4 times per year   Marital Status: Divorced  Human resources officer Violence: Not At Risk   Fear of Current or Ex-Partner: No   Emotionally Abused: No   Physically Abused: No   Sexually Abused: No     Current Outpatient Medications:    levocetirizine (XYZAL) 5 MG tablet, Take 1 tablet (5 mg total) by mouth every evening., Disp: 90 tablet, Rfl: 1   meloxicam (MOBIC) 15 MG tablet, Take 1 tablet (15 mg total) by mouth daily., Disp: 30 tablet, Rfl: 1   montelukast (SINGULAIR) 10 MG tablet, Take 1 tablet (10 mg total) by mouth at bedtime., Disp: 90 tablet, Rfl: 1   omeprazole (PRILOSEC) 40 MG capsule, Take 1 capsule (40 mg total) by mouth daily., Disp: 90 capsule, Rfl: 1   pregabalin (LYRICA) 50 MG capsule, Take 1-2 capsules (50-100 mg total) by mouth at bedtime., Disp: 60 capsule, Rfl: 0   triamcinolone (NASACORT ALLERGY 24HR) 55 MCG/ACT AERO nasal inhaler, Place 1 spray into the nose daily., Disp: 16.9 mL, Rfl: 3   AUVI-Q 0.3 MG/0.3ML SOAJ injection, INJECT AS NEEDED FOR SEVERE ALLERGIC REACTION INCLUDING ANAPHYLAXIS AS DIRECTED (Patient not taking: Reported on 05/04/2020), Disp: , Rfl: 1   naproxen (NAPROSYN) 500 MG tablet, Take 1 tablet (500 mg total) by mouth 2 (two) times daily with a meal. (Patient not taking: Reported on 05/04/2020), Disp: 60 tablet, Rfl: 0  Allergies  Allergen Reactions   Codeine Nausea And Vomiting     ROS  Constitutional: Negative for fever or weight change.  Respiratory: Negative for cough and  shortness of breath.   Cardiovascular: Negative for chest pain or palpitations.  Gastrointestinal: Negative for abdominal pain, no bowel changes.  Musculoskeletal: positive for gait problem at the end of the day secondary to left ankle pain Skin: Negative for rash.  Neurological: Negative for dizziness or headache.  No other specific complaints in a complete review of systems (except as listed in HPI above).  Objective  Vitals:   05/04/20 1002  BP: 120/80  Pulse: 84  Resp: 16  Temp: (!) 97.5 F (36.4 C)  TempSrc: Temporal  SpO2: 95%  Weight: 144 lb 6.4 oz (65.5 kg)  Height: 5' 2.25" (1.581 m)    Body mass index is 26.2 kg/m.  Physical Exam  Constitutional: Patient appears well-developed and well-nourished. No distress.  HENT: Head: Normocephalic and atraumatic. Ears: B TMs ok, no erythema or effusion; Nose: Nose normal. Mouth/Throat: Oropharynx is clear and moist. No oropharyngeal exudate.  Eyes: Conjunctivae and EOM are normal. Pupils are equal, round, and reactive to light. No scleral icterus.  Neck: Normal range of motion. Neck supple. No JVD present. No thyromegaly present.  Cardiovascular: Normal rate, regular rhythm and normal heart sounds.  No murmur heard. No BLE edema. Pulmonary/Chest: Effort normal and breath sounds normal. No respiratory distress. Abdominal: Soft. Bowel sounds are normal, no distension. There is no tenderness. no masses Breast: no lumps or masses, no nipple discharge or rashes FEMALE GENITALIA:  Not done RECTAL: not done  Musculoskeletal: Normal range of motion, no joint effusions. No gross deformities Neurological: he is alert and oriented to person, place, and time. No cranial nerve deficit. Coordination, balance, strength, speech and gait are normal.  Skin: Skin is warm and dry. No rash noted. No erythema.  Psychiatric: Patient has a normal mood and affect. behavior is normal. Judgment and thought content normal.   Fall Risk: Fall Risk   05/04/2020 12/05/2019 07/08/2019 06/10/2019 06/04/2019  Falls in the  past year? 0 0 0 0 1  Number falls in past yr: 0 0 0 0 0  Injury with Fall? 0 0 0 0 1  Comment - - - - left ankle  Follow up - - - - -     Functional Status Survey: Is the patient deaf or have difficulty hearing?: No Does the patient have difficulty seeing, even when wearing glasses/contacts?: No Does the patient have difficulty concentrating, remembering, or making decisions?: No Does the patient have difficulty walking or climbing stairs?: No Does the patient have difficulty dressing or bathing?: No Does the patient have difficulty doing errands alone such as visiting a doctor's office or shopping?: No   Assessment & Plan  1. Well adult exam  Try to exercise during the week   -USPSTF grade A and B recommendations reviewed with patient; age-appropriate recommendations, preventive care, screening tests, etc discussed and encouraged; healthy living encouraged; see AVS for patient education given to patient -Discussed importance of 150 minutes of physical activity weekly, eat two servings of fish weekly, eat one serving of tree nuts ( cashews, pistachios, pecans, almonds.Marland Kitchen) every other day, eat 6 servings of fruit/vegetables daily and drink plenty of water and avoid sweet beverages.

## 2020-05-12 ENCOUNTER — Ambulatory Visit
Admission: RE | Admit: 2020-05-12 | Discharge: 2020-05-12 | Disposition: A | Discharge status: Home / Self Care | Payer: 59 | Source: Ambulatory Visit | Attending: Family Medicine | Admitting: Family Medicine

## 2020-05-12 DIAGNOSIS — Z1231 Encounter for screening mammogram for malignant neoplasm of breast: Secondary | ICD-10-CM | POA: Diagnosis not present

## 2020-05-19 ENCOUNTER — Ambulatory Visit: Payer: 59 | Admitting: Family Medicine

## 2020-05-25 ENCOUNTER — Ambulatory Visit: Payer: 59 | Admitting: Podiatry

## 2020-05-25 DIAGNOSIS — J301 Allergic rhinitis due to pollen: Secondary | ICD-10-CM | POA: Diagnosis not present

## 2020-05-25 DIAGNOSIS — M26609 Unspecified temporomandibular joint disorder, unspecified side: Secondary | ICD-10-CM | POA: Diagnosis not present

## 2020-06-04 DIAGNOSIS — H1045 Other chronic allergic conjunctivitis: Secondary | ICD-10-CM | POA: Diagnosis not present

## 2020-06-04 DIAGNOSIS — H40019 Open angle with borderline findings, low risk, unspecified eye: Secondary | ICD-10-CM | POA: Diagnosis not present

## 2020-06-06 ENCOUNTER — Encounter: Payer: Self-pay | Admitting: Radiology

## 2020-06-06 ENCOUNTER — Emergency Department: Payer: 59

## 2020-06-06 ENCOUNTER — Emergency Department
Admission: EM | Admit: 2020-06-06 | Discharge: 2020-06-06 | Disposition: A | Discharge status: Home / Self Care | Payer: 59 | Attending: Emergency Medicine | Admitting: Emergency Medicine

## 2020-06-06 ENCOUNTER — Other Ambulatory Visit: Payer: Self-pay

## 2020-06-06 DIAGNOSIS — M5134 Other intervertebral disc degeneration, thoracic region: Secondary | ICD-10-CM | POA: Diagnosis not present

## 2020-06-06 DIAGNOSIS — R918 Other nonspecific abnormal finding of lung field: Secondary | ICD-10-CM | POA: Diagnosis not present

## 2020-06-06 DIAGNOSIS — R079 Chest pain, unspecified: Secondary | ICD-10-CM | POA: Diagnosis not present

## 2020-06-06 DIAGNOSIS — J9811 Atelectasis: Secondary | ICD-10-CM | POA: Diagnosis not present

## 2020-06-06 DIAGNOSIS — M79604 Pain in right leg: Secondary | ICD-10-CM | POA: Insufficient documentation

## 2020-06-06 LAB — BASIC METABOLIC PANEL
Anion gap: 11 (ref 5–15)
BUN: 21 mg/dL — ABNORMAL HIGH (ref 6–20)
CO2: 25 mmol/L (ref 22–32)
Calcium: 9.2 mg/dL (ref 8.9–10.3)
Chloride: 104 mmol/L (ref 98–111)
Creatinine, Ser: 0.77 mg/dL (ref 0.44–1.00)
GFR calc Af Amer: >60 mL/min (ref >60)
GFR calc non Af Amer: >60 mL/min (ref >60)
Glucose, Bld: 97 mg/dL (ref 70–99)
Potassium: 3.5 mmol/L (ref 3.5–5.1)
Sodium: 140 mmol/L (ref 135–145)

## 2020-06-06 LAB — CBC
HCT: 41.9 % (ref 36.0–46.0)
Hemoglobin: 13.5 g/dL (ref 12.0–15.0)
MCH: 29 pg (ref 26.0–34.0)
MCHC: 32.2 g/dL (ref 30.0–36.0)
MCV: 89.9 fL (ref 80.0–100.0)
Platelets: 286 10*3/uL (ref 150–400)
RBC: 4.66 MIL/uL (ref 3.87–5.11)
RDW: 14.2 % (ref 11.5–15.5)
WBC: 13.9 10*3/uL — ABNORMAL HIGH (ref 4.0–10.5)
nRBC: 0 % (ref 0.0–0.2)

## 2020-06-06 LAB — TROPONIN I (HIGH SENSITIVITY)
Troponin I (High Sensitivity): 4 ng/L (ref <18)
Troponin I (High Sensitivity): 3 ng/L (ref <18)

## 2020-06-06 MED ORDER — IOHEXOL 350 MG/ML SOLN
75.0000 mL | Freq: Once | INTRAVENOUS | Status: AC | PRN
  Administered 2020-06-06: 75 mL via INTRAVENOUS

## 2020-06-06 MED ORDER — ACETAMINOPHEN 500 MG PO TABS
1000.0000 mg | ORAL_TABLET | Freq: Once | ORAL | Status: AC
  Administered 2020-06-06: 1000 mg via ORAL
  Filled 2020-06-06: qty 2

## 2020-06-06 NOTE — ED Triage Notes (Signed)
Patient reports right leg pain since yesterday.  Patient also reports chest discomfort (especially with deep breathing and movement).

## 2020-06-06 NOTE — Discharge Instructions (Addendum)
Take tylenol 1000mg  every 8 hours or 600mg  of ibuprofen with meals or snack every 6 hours for pain. Apply heat.  Follow-up with your doctor in 2 days.  Return to the emergency room for new or worsening leg pain, chest pain, numbness in your leg, shortness of breath, dizziness, changes in color of the leg.

## 2020-06-06 NOTE — ED Provider Notes (Signed)
[facility] Emergency Department Provider Note  ____________________________________________  Time seen: Approximately 4:28 AM  I have reviewed the triage vital signs and the nursing notes.   HISTORY  Chief Complaint Leg Pain   HPI Makayla Mason is a 54 y.o. female with a history of chronic right hip pain who presents for evaluation of right leg pain.  Patient reports she sustained a fall in 2019 and fell on top of her right leg.  Since then she has had mild pain due to arthritis.  This morning when she woke up the pain was more pronounced.  The pain is located in the proximal anterior thigh, constant and nonradiating.  She describes the pain as a pulling sensation.   She also describes chest pain that started this evening.  The pain is in the center of her chest, sharp, only present with deep inspiration.  No cough or fever, no shortness of breath.  No personal or family history of PE or DVT, no recent travel immobilization, no leg swelling, no hemoptysis or exogenous hormones.  Past Medical History:  Diagnosis Date  . Allergic rhinitis, seasonal   . Mild scoliosis   . TMJ arthralgia    right side    Patient Active Problem List   Diagnosis Date Noted  . Cervical radicular pain 07/15/2018  . Overweight (BMI 25.0-29.9) 01/29/2018  . Right hip pain 01/29/2018  . Insulin resistance 02/28/2017  . Low grade squamous intraepith lesion on cytologic smear cervix (lgsil) 02/28/2017  . GERD without esophagitis 03/15/2016  . Left ankle pain 03/15/2016  . Allergic rhinitis, seasonal 05/21/2015  . Scoliosis 05/21/2015  . 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 COLONOSCOPY WITH PROPOFOL N/A 02/15/2018   Procedure: COLONOSCOPY WITH PROPOFOL;  Surgeon: Jonathon Bellows, MD;  Location: [facility] ENDOSCOPY;  Service: Gastroenterology;  Laterality: N/A;    Prior to Admission medications   Medication Sig  Start Date End Date Taking? Authorizing Provider  AUVI-Q 0.3 MG/0.3ML SOAJ injection INJECT AS NEEDED FOR SEVERE ALLERGIC REACTION INCLUDING ANAPHYLAXIS AS DIRECTED Patient not taking: Reported on 05/04/2020 06/07/18   [provider]  levocetirizine (XYZAL) 5 MG tablet Take 1 tablet (5 mg total) by mouth every evening. 12/05/19   Steele Sizer, MD  meloxicam (MOBIC) 15 MG tablet Take 1 tablet (15 mg total) by mouth daily. 04/27/20   Edrick Kins, DPM  montelukast (SINGULAIR) 10 MG tablet Take 1 tablet (10 mg total) by mouth at bedtime. 12/05/19   Steele Sizer, MD  naproxen (NAPROSYN) 500 MG tablet Take 1 tablet (500 mg total) by mouth 2 (two) times daily with a meal. Patient not taking: Reported on 05/04/2020 12/05/19   Steele Sizer, MD  omeprazole (PRILOSEC) 40 MG capsule Take 1 capsule (40 mg total) by mouth daily. 06/04/19   Steele Sizer, MD  pregabalin (LYRICA) 50 MG capsule Take 1-2 capsules (50-100 mg total) by mouth at bedtime. 12/05/19   Steele Sizer, MD  triamcinolone (NASACORT ALLERGY 24HR) 55 MCG/ACT AERO nasal inhaler Place 1 spray into the nose daily. 05/05/19   Hubbard Hartshorn, FNP    Allergies Codeine  Family History  Problem Relation Age of Onset  . Breast cancer Cousin   . Diabetes Mother   . Diabetes Father   . Hypertension Father   . Asthma Daughter   . Anemia Maternal Aunt   . Stroke Maternal Aunt   . Heart disease Maternal Grandmother   .  Kidney disease Maternal Grandmother   . Hypertension Maternal Grandmother     Social History Social History   Tobacco Use  . Smoking status: Never Smoker  . Smokeless tobacco: Never Used  Vaping Use  . Vaping Use: Never used  Substance Use Topics  . Alcohol use: Yes    Alcohol/week: 0.0 standard drinks    Comment: occasional  . Drug use: No    Review of Systems  Constitutional: Negative for fever. Eyes: Negative for visual changes. ENT: Negative for sore throat. Neck: No neck pain  Cardiovascular:  + chest pain. Respiratory: Negative for shortness of breath. Gastrointestinal: Negative for abdominal pain, vomiting or diarrhea. Genitourinary: Negative for dysuria. Musculoskeletal: Negative for back pain. + RLE pain Skin: Negative for rash. Neurological: Negative for headaches, weakness or numbness. Psych: No SI or HI  ____________________________________________   PHYSICAL EXAM:  VITAL SIGNS: ED Triage Vitals  Enc Vitals Group     BP 06/06/20 0050 (!) 135/80     Pulse Rate 06/06/20 0050 (!) 110     Resp 06/06/20 0050 18     Temp 06/06/20 0050 98.4 F (36.9 C)     Temp Source 06/06/20 0050 Oral     SpO2 06/06/20 0050 99 %     Weight 06/06/20 0048 145 lb (65.8 kg)     Height 06/06/20 0048 5\' 1"  (1.549 m)     Head Circumference --      Peak Flow --      Pain Score 06/06/20 0048 10     Pain Loc --      Pain Edu? --      Excl. in [place]? --     Constitutional: Alert and oriented. Well appearing and in no apparent distress. HEENT:      Head: Normocephalic and atraumatic.         Eyes: Conjunctivae are normal. Sclera is non-icteric.       Mouth/Throat: Mucous membranes are moist.       Neck: Supple with no signs of meningismus. Cardiovascular: Regular rate and rhythm. No murmurs, gallops, or rubs. 2+ symmetrical distal pulses are present in all extremities. No JVD. Respiratory: Normal respiratory effort. Lungs are clear to auscultation bilaterally. Gastrointestinal: Soft, non tender. Musculoskeletal: Nontender with normal range of motion in all extremities. No edema, cyanosis, or erythema of extremities. Neurologic: Normal speech and language. Face is symmetric. Moving all extremities. No gross focal neurologic deficits are appreciated. Skin: Skin is warm, dry and intact. No rash noted. Psychiatric: Mood and affect are normal. Speech and behavior are normal.  ____________________________________________   LABS (all labs ordered are listed, but only abnormal results are  displayed)  Labs Reviewed  BASIC METABOLIC PANEL - Abnormal; Notable for the following components:      Result Value   BUN 21 (*)    All other components within normal limits  CBC - Abnormal; Notable for the following components:   WBC 13.9 (*)    All other components within normal limits  POC URINE PREG, ED  TROPONIN I (HIGH SENSITIVITY)  TROPONIN I (HIGH SENSITIVITY)   ____________________________________________  EKG  ED ECG REPORT I, Rudene Re, the attending physician, personally viewed and interpreted this ECG.  Sinus tachycardia, rate of 110, normal intervals, normal axis, T wave inversions in the inferior leads with no ST elevation.  No prior for comparison. ____________________________________________  RADIOLOGY  I have personally reviewed the images performed during this visit and I agree with the Radiologist's read.  Interpretation by Radiologist:  DG Chest 2 View  Result Date: 06/06/2020 CLINICAL DATA:  Chest pain EXAM: CHEST - 2 VIEW COMPARISON:  05/21/2015 FINDINGS: Cardiac shadow is within normal limits. The lungs are well aerated bilaterally. Mild bronchitic markings are noted without focal infiltrate. No sizable effusion is noted. No bony abnormality is seen. IMPRESSION: Mild bronchitic markings without focal infiltrate. Electronically Signed   By: Inez Catalina M.D.   On: 06/06/2020 01:25   CT Angio Chest PE W and/or Wo Contrast  Result Date: 06/06/2020 CLINICAL DATA:  Right leg pain since yesterday. EXAM: CT ANGIOGRAPHY CHEST WITH CONTRAST TECHNIQUE: Multidetector CT imaging of the chest was performed using the standard protocol during bolus administration of intravenous contrast. Multiplanar CT image reconstructions and MIPs were obtained to evaluate the vascular anatomy. CONTRAST:  69mL OMNIPAQUE IOHEXOL 350 MG/ML SOLN COMPARISON:  None. FINDINGS: Cardiovascular: There is satisfactory opacification of the pulmonary arteries to the segmental level. There  is no evidence of a pulmonary embolism. Heart is normal in size and configuration. No pericardial effusion. Great vessels are normal in caliber. No aortic dissection or atherosclerosis. Mediastinum/Nodes: No enlarged mediastinal, hilar, or axillary lymph nodes. Thyroid gland, trachea, and esophagus demonstrate no significant findings. Lungs/Pleura: Mild linear lung base atelectasis. Lungs otherwise clear. No pleural effusion or pneumothorax. Upper Abdomen: No acute abnormality. Musculoskeletal: No fracture or bone lesion. Minor disc degenerative changes throughout the thoracic spine. No other abnormality. Review of the MIP images confirms the above findings. IMPRESSION: 1. No evidence of a pulmonary embolism. 2. No acute findings. Electronically Signed   By: Lajean Manes M.D.   On: 06/06/2020 05:47   US Venous Img Lower Unilateral Right  Result Date: 06/06/2020 CLINICAL DATA:  Right leg pain for 1 day. EXAM: RIGHT LOWER EXTREMITY VENOUS DOPPLER ULTRASOUND TECHNIQUE: Gray-scale sonography with compression, as well as color and duplex ultrasound, were performed to evaluate the deep venous system(s) from the level of the common femoral vein through the popliteal and proximal calf veins. COMPARISON:  None. FINDINGS: VENOUS Normal compressibility of the common femoral, superficial femoral, and popliteal veins, as well as the visualized calf veins. Visualized portions of profunda femoral vein and great saphenous vein unremarkable. No filling defects to suggest DVT on grayscale or color Doppler imaging. Doppler waveforms show normal direction of venous flow, normal respiratory plasticity and response to augmentation. Limited views of the contralateral common femoral vein are unremarkable. OTHER None. Limitations: none IMPRESSION: Negative. Electronically Signed   By: Lajean Manes M.D.   On: 06/06/2020 05:15     ____________________________________________   PROCEDURES  Procedure(s) performed:yes .1-3 Lead EKG  Interpretation Performed by: Rudene Re, MD Authorized by: Rudene Re, MD     Interpretation: non-specific     ECG rate assessment: tachycardic     Rhythm: sinus tachycardia     Ectopy: none     Critical Care performed: yes  CRITICAL CARE Performed by: Rudene Re  ?  Total critical care time: 35 min  Critical care time was exclusive of separately billable procedures and treating other patients.  Critical care was necessary to treat or prevent imminent or life-threatening deterioration.  Critical care was time spent personally by me on the following activities: development of treatment plan with patient and/or surrogate as well as nursing, discussions with consultants, evaluation of patient's response to treatment, examination of patient, obtaining history from patient or surrogate, ordering and performing treatments and interventions, ordering and review of laboratory studies, ordering and review of radiographic studies,  pulse oximetry and re-evaluation of patient's condition.  ____________________________________________   INITIAL IMPRESSION / ASSESSMENT AND PLAN / ED COURSE  54 y.o. female with a history of chronic right hip pain who presents for evaluation of right leg pain and pleuritic CP.  Patient is well-appearing in no distress, tachycardic with a pulse of 110, normal work of breathing and normal sats with clear lungs.  Legs have no edema, no asymmetric swelling, no erythema or warmth.  Full painless range of motion of all joints and strong distal pulses and cap refill.  Obviously with leg pain, new pleuritic chest pain, and tachycardia, PE/DVT is high my differential diagnosis.  We will send patient for Doppler studies and CT angio of the chest.  Chest x-ray visualized with no evidence of pneumothorax, pneumonia or edema, confirmed by radiology.  EKG showing no acute ischemic changes.  Troponin is negative.  No signs of limb ischemia or cellulitis on  exam.  History gathered from patient and her daughters at bedside.  Plan discussed with both of them.  Old medical records reviewed.  _________________________ 6:09 AM on 06/06/2020 -----------------------------------------  Doppler study negative for DVT.  CT angio of the chest negative for PE, pneumonia, dissection.  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  Will discharge home with Tylenol/ibuprofen, heat, follow-up with PCP.  Discussed my standard return precautions.    _____________________________________________ Please note:  Patient was evaluated in Emergency Department today for the symptoms described in the history of present illness. Patient was evaluated in the context of the global COVID-19 pandemic, which necessitated consideration that the patient might be at risk for infection with the SARS-CoV-2 virus that causes COVID-19. Institutional protocols and algorithms that pertain to the evaluation of patients at risk for COVID-19 are in a state of rapid change based on information released by regulatory bodies including the CDC and federal and state organizations. These policies and algorithms were followed during the patient's care in the ED.  Some ED evaluations and interventions may be delayed as a result of limited staffing during the pandemic.   [place] Controlled Substance Database was reviewed by me. ____________________________________________   FINAL CLINICAL IMPRESSION(S) / ED DIAGNOSES   Final diagnoses:  Right leg pain  Chest pain, unspecified type      NEW MEDICATIONS STARTED DURING THIS VISIT:  ED Discharge Orders    None       Note:  This document was prepared using Dragon voice recognition software and may include unintentional dictation errors.    Alfred Levins, [place], MD 06/06/20 [phone]

## 2020-06-07 NOTE — Patient Instructions (Signed)
Preventive Care 40-56 Years Old, Female Preventive care refers to visits with your health care provider and lifestyle choices that can promote health and wellness. This includes:  A yearly physical exam. This may also be called an annual well check.  Regular dental visits and eye exams.  Immunizations.  Screening for certain conditions.  Healthy lifestyle choices, such as eating a healthy diet, getting regular exercise, not using drugs or products that contain nicotine and tobacco, and limiting alcohol use. What can I expect for my preventive care visit? Physical exam Your health care provider will check your:  Height and weight. This may be used to calculate body mass index (BMI), which tells if you are at a healthy weight.  Heart rate and blood pressure.  Skin for abnormal spots. Counseling Your health care provider may ask you questions about your:  Alcohol, tobacco, and drug use.  Emotional well-being.  Home and relationship well-being.  Sexual activity.  Eating habits.  Work and work environment.  Method of birth control.  Menstrual cycle.  Pregnancy history. What immunizations do I need?  Influenza (flu) vaccine  This is recommended every year. Tetanus, diphtheria, and pertussis (Tdap) vaccine  You may need a Td booster every 10 years. Varicella (chickenpox) vaccine  You may need this if you have not been vaccinated. Zoster (shingles) vaccine  You may need this after age 60. Measles, mumps, and rubella (MMR) vaccine  You may need at least one dose of MMR if you were born in 1957 or later. You may also need a second dose. Pneumococcal conjugate (PCV13) vaccine  You may need this if you have certain conditions and were not previously vaccinated. Pneumococcal polysaccharide (PPSV23) vaccine  You may need one or two doses if you smoke cigarettes or if you have certain conditions. Meningococcal conjugate (MenACWY) vaccine  You may need this if you  have certain conditions. Hepatitis A vaccine  You may need this if you have certain conditions or if you travel or work in places where you may be exposed to hepatitis A. Hepatitis B vaccine  You may need this if you have certain conditions or if you travel or work in places where you may be exposed to hepatitis B. Haemophilus influenzae type b (Hib) vaccine  You may need this if you have certain conditions. Human papillomavirus (HPV) vaccine  If recommended by your health care provider, you may need three doses over 6 months. You may receive vaccines as individual doses or as more than one vaccine together in one shot (combination vaccines). Talk with your health care provider about the risks and benefits of combination vaccines. What tests do I need? Blood tests  Lipid and cholesterol levels. These may be checked every 5 years, or more frequently if you are over 56 years old.  Hepatitis C test.  Hepatitis B test. Screening  Lung cancer screening. You may have this screening every year starting at age 56 if you have a 30-pack-year history of smoking and currently smoke or have quit within the past 15 years.  Colorectal cancer screening. All adults should have this screening starting at age 56 and continuing until age 56. Your health care provider may recommend screening at age 56 if you are at increased risk. You will have tests every 1-10 years, depending on your results and the type of screening test.  Diabetes screening. This is done by checking your blood sugar (glucose) after you have not eaten for a while (fasting). You may have this   done every 1-3 years.  Mammogram. This may be done every 1-2 years. Talk with your health care provider about when you should start having regular mammograms. This may depend on whether you have a family history of breast cancer.  BRCA-related cancer screening. This may be done if you have a family history of breast, ovarian, tubal, or peritoneal  cancers.  Pelvic exam and Pap test. This may be done every 3 years starting at age 56. Starting at age 7, this may be done every 5 years if you have a Pap test in combination with an HPV test. Other tests  Sexually transmitted disease (STD) testing.  Bone density scan. This is done to screen for osteoporosis. You may have this scan if you are at high risk for osteoporosis. Follow these instructions at home: Eating and drinking  Eat a diet that includes fresh fruits and vegetables, whole grains, lean protein, and low-fat dairy.  Take vitamin and mineral supplements as recommended by your health care provider.  Do not drink alcohol if: ? Your health care provider tells you not to drink. ? You are pregnant, may be pregnant, or are planning to become pregnant.  If you drink alcohol: ? Limit how much you have to 0-1 drink a day. ? Be aware of how much alcohol is in your drink. In the [place] one drink equals one 12 oz bottle of beer (355 mL), one 5 oz glass of wine (148 mL), or one 1 oz glass of hard liquor (44 mL). Lifestyle  Take daily care of your teeth and gums.  Stay active. Exercise for at least 30 minutes on 5 or more days each week.  Do not use any products that contain nicotine or tobacco, such as cigarettes, e-cigarettes, and chewing tobacco. If you need help quitting, ask your health care provider.  If you are sexually active, practice safe sex. Use a condom or other form of birth control (contraception) in order to prevent pregnancy and STIs (sexually transmitted infections).  If told by your health care provider, take low-dose aspirin daily starting at age 56. What's next?  Visit your health care provider once a year for a well check visit.  Ask your health care provider how often you should have your eyes and teeth checked.  Stay up to date on all vaccines. This information is not intended to replace advice given to you by your health care provider. Make sure you  discuss any questions you have with your health care provider. Document Revised: 07/04/2018 Document Reviewed: 07/04/2018 Elsevier Patient Education  2020 [place] Breast self-awareness is knowing how your breasts look and feel. Doing breast self-awareness is important. It allows you to catch a breast problem early while it is still small and can be treated. All women should do breast self-awareness, including women who have had breast implants. Tell your doctor if you notice a change in your breasts. What you need:  A mirror.  A well-lit room. How to do a breast self-exam A breast self-exam is one way to learn what is normal for your breasts and to check for changes. To do a breast self-exam: Look for changes  1. Take off all the clothes above your waist. 2. Stand in front of a mirror in a room with good lighting. 3. Put your hands on your hips. 4. Push your hands down. 5. Look at your breasts and nipples in the mirror to see if one breast or nipple looks different from the  other. Check to see if: ? The shape of one breast is different. ? The size of one breast is different. ? There are wrinkles, dips, and bumps in one breast and not the other. 6. Look at each breast for changes in the skin, such as: ? Redness. ? Scaly areas. 7. Look for changes in your nipples, such as: ? Liquid around the nipples. ? Bleeding. ? Dimpling. ? Redness. ? A change in where the nipples are. Feel for changes  1. Lie on your back on the floor. 2. Feel each breast. To do this, follow these steps: ? Pick a breast to feel. ? Put the arm closest to that breast above your head. ? Use your other arm to feel the nipple area of your breast. Feel the area with the pads of your three middle fingers by making small circles with your fingers. For the first circle, press lightly. For the second circle, press harder. For the third circle, press even harder. ? Keep making circles with  your fingers at the different pressures as you move down your breast. Stop when you feel your ribs. ? Move your fingers a little toward the center of your body. ? Start making circles with your fingers again, this time going up until you reach your collarbone. ? Keep making up-and-down circles until you reach your armpit. Remember to keep using the three pressures. ? Feel the other breast in the same way. 3. Sit or stand in the tub or shower. 4. With soapy water on your skin, feel each breast the same way you did in step 2 when you were lying on the floor. Write down what you find Writing down what you find can help you remember what to tell your doctor. Write down:  What is normal for each breast.  Any changes you find in each breast, including: ? The kind of changes you find. ? Whether you have pain. ? Size and location of any lumps.  When you last had your menstrual period. General tips  Check your breasts every month.  If you are breastfeeding, the best time to check your breasts is after you feed your baby or after you use a breast pump.  If you get menstrual periods, the best time to check your breasts is 5-7 days after your menstrual period is over.  With time, you will become comfortable with the self-exam, and you will begin to know if there are changes in your breasts. Contact a doctor if you:  See a change in the shape or size of your breasts or nipples.  See a change in the skin of your breast or nipples, such as red or scaly skin.  Have fluid coming from your nipples that is not normal.  Find a lump or thick area that was not there before.  Have pain in your breasts.  Have any concerns about your breast health. Summary  Breast self-awareness includes looking for changes in your breasts, as well as feeling for changes within your breasts.  Breast self-awareness should be done in front of a mirror in a well-lit room.  You should check your breasts every month.  If you get menstrual periods, the best time to check your breasts is 5-7 days after your menstrual period is over.  Let your doctor know of any changes you see in your breasts, including changes in size, changes on the skin, pain or tenderness, or fluid from your nipples that is not normal. This information is not  intended to replace advice given to you by your health care provider. Make sure you discuss any questions you have with your health care provider. Document Revised: 06/11/2018 Document Reviewed: 06/11/2018 Elsevier Patient Education  [place].

## 2020-06-07 NOTE — Progress Notes (Signed)
Patient ID: Makayla Mason, female    DOB: July 29, 1966, 54 y.o.   MRN: [id number]  PCP: Steele Sizer, MD  Chief Complaint  Patient presents with  . Hospitalization Follow-up    Subjective:   Makayla Mason is a 54 y.o. female, presents to clinic with CC of the following:  Chief Complaint  Patient presents with  . Hospitalization Follow-up    HPI:  Patient is a 54 year old female patient of Dr. Ancil Boozer Was seen in the emergency room the day before yesterday for right leg pain and chest pain Follows up today after that visit.  Her history for the ER visit was as follows:  Makayla Mason is a 54 y.o. female with a history of chronic right hip pain who presents for evaluation of right leg pain.  Patient reports she sustained a fall in 2019 and fell on top of her right leg.  Since then she has had mild pain due to arthritis.  This morning when she woke up the pain was more pronounced.  The pain is located in the proximal anterior thigh, constant and nonradiating.  She describes the pain as a pulling sensation.  She also describes chest pain that started this evening.  The pain is in the center of her chest, sharp, only present with deep inspiration.  No cough or fever, no shortness of breath.  No personal or family history of PE or DVT, no recent travel immobilization, no leg swelling, no hemoptysis or exogenous hormones.   EKG was as follows: Sinus tachycardia, rate of 110, normal intervals, normal axis, T wave inversions in the inferior leads with no ST elevation.  No prior for comparison.  Chest x-ray was as follows:IMPRESSION: Mild bronchitic markings without focal infiltrate.  CT angio of the chest was as follows:IMPRESSION: 1. No evidence of a pulmonary embolism. 2. No acute findings  Right lower extremity Doppler ultrasound was as follows:IMPRESSION: Negative  Assessment/plan from the emergency room visit as follows:  54 y.o. female with a history of chronic right hip pain  who presents for evaluation of right leg pain and pleuritic CP.  Patient is well-appearing in no distress, tachycardic with a pulse of 110, normal work of breathing and normal sats with clear lungs.  Legs have no edema, no asymmetric swelling, no erythema or warmth.  Full painless range of motion of all joints and strong distal pulses and cap refill.  Obviously with leg pain, new pleuritic chest pain, and tachycardia, PE/DVT is high my differential diagnosis.  We will send patient for Doppler studies and CT angio of the chest.  Chest x-ray visualized with no evidence of pneumothorax, pneumonia or edema, confirmed by radiology.  EKG showing no acute ischemic changes.  Troponin is negative.  No signs of limb ischemia or cellulitis on exam.  Doppler study negative for DVT.  CT angio of the chest negative for PE, pneumonia, dissection.  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  Will discharge home with Tylenol/ibuprofen, heat, follow-up with PCP.  Discussed my standard return precautions.  Patient has an appointment with cardiology scheduled for tomorrow.  She noted since the visit, she has had no more concerning chest pains with deep breaths, and has been feeling better.  She noted she was having bad arthritis pains in her right upper leg area when she went to the emergency room, and notes she has some intermittent pains in this area in her past.  They were  very problematic when she went to the emergency room, although notes they have improved.  She has been taking Tylenol products to help.  She has meloxicam at home to use when she gets arthritic symptoms in her ankles, although has not used that in the recent past.  She also notes she jammed her right thumb, and has been uncomfortable in the past couple days after it happened.  She has been applying ice.  Notes it is still sore more on the inner aspect of the thumb.  Also is a little swollen still.  She also notes she is  having more postnasal drip, related to allergy type symptoms, and noted she has been taking a nasal spray that starts with a to help more recently.  She thinks it is an antihistamine nasal spray when we discussed.  Her med list noted a Nasacort nasal spray in her past  She also noted seeing OB/GYN earlier this morning and having her Pap done, and had blood drawn at that visit as well.      Patient Active Problem List   Diagnosis Date Noted  . Cervical radicular pain 07/15/2018  . Overweight (BMI 25.0-29.9) 01/29/2018  . Right hip pain 01/29/2018  . Insulin resistance 02/28/2017  . Low grade squamous intraepith lesion on cytologic smear cervix (lgsil) 02/28/2017  . GERD without esophagitis 03/15/2016  . Left ankle pain 03/15/2016  . Allergic rhinitis, seasonal 05/21/2015  . Scoliosis 05/21/2015  . Arthralgia of temporomandibular joint 05/21/2015      Current Outpatient Medications:  .  meloxicam (MOBIC) 15 MG tablet, Take 1 tablet (15 mg total) by mouth daily., Disp: 30 tablet, Rfl: 1 .  montelukast (SINGULAIR) 10 MG tablet, Take 1 tablet (10 mg total) by mouth at bedtime., Disp: 90 tablet, Rfl: 1 .  omeprazole (PRILOSEC) 40 MG capsule, Take 1 capsule (40 mg total) by mouth daily., Disp: 90 capsule, Rfl: 1 .  triamcinolone (NASACORT ALLERGY 24HR) 55 MCG/ACT AERO nasal inhaler, Place 1 spray into the nose daily., Disp: 16.9 mL, Rfl: 3   Allergies  Allergen Reactions  . Codeine Nausea And Vomiting     Past Surgical History:  Procedure Laterality Date  . COLONOSCOPY WITH PROPOFOL N/A 02/15/2018   Procedure: COLONOSCOPY WITH PROPOFOL;  Surgeon: Jonathon Bellows, MD;  Location: [facility] ENDOSCOPY;  Service: Gastroenterology;  Laterality: N/A;     Family History  Problem Relation Age of Onset  . Breast cancer Cousin   . Diabetes Mother   . Diabetes Father   . Hypertension Father   . Asthma Daughter   . Anemia Maternal Aunt   . Stroke Maternal Aunt   . Heart disease Maternal  Grandmother   . Kidney disease Maternal Grandmother   . Hypertension Maternal Grandmother      Social History   Tobacco Use  . Smoking status: Never Smoker  . Smokeless tobacco: Never Used  Substance Use Topics  . Alcohol use: Yes    Alcohol/week: 0.0 standard drinks    Comment: occasional    With staff assistance, above reviewed with the patient today.  ROS: As per HPI, otherwise no specific complaints on a limited and focused system review   Results for orders placed or performed during the hospital encounter of 06/06/20 (from the past 72 hour(s))  Basic metabolic panel     Status: Abnormal   Collection Time: 06/06/20  1:05 AM  Result Value Ref Range   Sodium 140 135 - 145 mmol/L   Potassium 3.5 3.5 -  5.1 mmol/L   Chloride 104 98 - 111 mmol/L   CO2 25 22 - 32 mmol/L   Glucose, Bld 97 70 - 99 mg/dL    Comment: Glucose reference range applies only to samples taken after fasting for at least 8 hours.   BUN 21 (H) 6 - 20 mg/dL   Creatinine, Ser 0.77 0.44 - 1.00 mg/dL   Calcium 9.2 8.9 - 10.3 mg/dL   GFR calc non Af Amer >60 >60 mL/min   GFR calc Af Amer >60 >60 mL/min   Anion gap 11 5 - 15    Comment: Performed at [facility], [address] [place], [place] [address]  CBC     Status: Abnormal   Collection Time: 06/06/20  1:05 AM  Result Value Ref Range   WBC 13.9 (H) 4.0 - 10.5 K/uL   RBC 4.66 3.87 - 5.11 MIL/uL   Hemoglobin 13.5 12.0 - 15.0 g/dL   HCT 41.9 36 - 46 %   MCV 89.9 80.0 - 100.0 fL   MCH 29.0 26.0 - 34.0 pg   MCHC 32.2 30.0 - 36.0 g/dL   RDW 14.2 11.5 - 15.5 %   Platelets 286 150 - 400 K/uL   nRBC 0.0 0.0 - 0.2 %    Comment: Performed at [facility], [place], [place] [address]  Troponin I (High Sensitivity)     Status: None   Collection Time: 06/06/20  1:05 AM  Result Value Ref Range   Troponin I (High Sensitivity) 4 <18 ng/L    Comment: (NOTE) Elevated high sensitivity troponin I (hsTnI) values and significant   changes across serial measurements may suggest ACS but many other  chronic and acute conditions are known to elevate hsTnI results.  Refer to the "Links" section for chest pain algorithms and additional  guidance. Performed at [facility], [place], [place] [address]   Troponin I (High Sensitivity)     Status: None   Collection Time: 06/06/20  5:35 AM  Result Value Ref Range   Troponin I (High Sensitivity) 3 <18 ng/L    Comment: (NOTE) Elevated high sensitivity troponin I (hsTnI) values and significant  changes across serial measurements may suggest ACS but many other  chronic and acute conditions are known to elevate hsTnI results.  Refer to the "Links" section for chest pain algorithms and additional  guidance. Performed at [facility], [place],  [address]      PHQ2/9: Depression screen [facility] 2/9 06/08/2020 05/04/2020 12/05/2019 06/10/2019 06/04/2019  Decreased Interest 0 0 0 0 0  Down, Depressed, Hopeless 0 0 0 0 0  PHQ - 2 Score 0 0 0 0 0  Altered sleeping 0 0 0 0 0  Tired, decreased energy 0 0 0 0 0  Change in appetite 0 0 0 0 0  Feeling bad or failure about yourself  0 0 0 0 0  Trouble concentrating 0 0 0 0 0  Moving slowly or fidgety/restless 0 0 0 0 0  Suicidal thoughts 0 0 0 0 0  PHQ-9 Score 0 0 0 0 0  Difficult doing work/chores Not difficult at all Not difficult at all - - Not difficult at all  Some recent data might be hidden   PHQ-2/9 Result is neg  Fall Risk: Fall Risk  06/08/2020 05/04/2020 12/05/2019 07/08/2019 06/10/2019  Falls in the past year? 0 0 0 0 0  Number falls in past yr: 0  0 0 0 0  Injury with Fall? 0 0 0 0 0  Comment - - - - -  Follow up - - - - -      Objective:   Vitals:   06/08/20 1124  BP: 122/82  Pulse: 80  Resp: 16  Temp: 98.5 F (36.9 C)  TempSrc: Temporal  SpO2: 94%  Weight: 139 lb 6.4 oz (63.2 kg)  Height: 5\' 1"  (1.549 m)    Body mass index is 26.34 kg/m.  Physical Exam    NAD, masked, very pleasant HEENT - [place]/AT, sclera anicteric, PERRL, EOMI, conj - non-inj'ed,   Neck - supple, no adenopathy, no TM, carotids 2+ and = without bruits bilat Car - RRR without m/g/r Pulm- RR and effort normal at rest, CTA without wheeze or rales Ext - no LE edema,  Right thumb-adequate range of motion of the thumb with discomfort palpating the ulnar collateral ligament area, and some mild swelling over this persisting.  Noted discomfort testing the ulnar collateral ligament, with no marked laxity noted when testing nontender over the phalanges and IP joint of the thumb, and nontender over the metacarpal. Neuro/psychiatric - affect was not flat, appropriate with conversation  Alert and oriented  Grossly non-focal   Speech normal   Results for orders placed or performed during the hospital encounter of 48/54/62  Basic metabolic panel  Result Value Ref Range   Sodium 140 135 - 145 mmol/L   Potassium 3.5 3.5 - 5.1 mmol/L   Chloride 104 98 - 111 mmol/L   CO2 25 22 - 32 mmol/L   Glucose, Bld 97 70 - 99 mg/dL   BUN 21 (H) 6 - 20 mg/dL   Creatinine, Ser 0.77 0.44 - 1.00 mg/dL   Calcium 9.2 8.9 - 10.3 mg/dL   GFR calc non Af Amer >60 >60 mL/min   GFR calc Af Amer >60 >60 mL/min   Anion gap 11 5 - 15  CBC  Result Value Ref Range   WBC 13.9 (H) 4.0 - 10.5 K/uL   RBC 4.66 3.87 - 5.11 MIL/uL   Hemoglobin 13.5 12.0 - 15.0 g/dL   HCT 41.9 36 - 46 %   MCV 89.9 80.0 - 100.0 fL   MCH 29.0 26.0 - 34.0 pg   MCHC 32.2 30.0 - 36.0 g/dL   RDW 14.2 11.5 - 15.5 %   Platelets 286 150 - 400 K/uL   nRBC 0.0 0.0 - 0.2 %  Troponin I (High Sensitivity)  Result Value Ref Range   Troponin I (High Sensitivity) 4 <18 ng/L  Troponin I (High Sensitivity)  Result Value Ref Range   Troponin I (High Sensitivity) 3 <18 ng/L       Assessment & Plan:   1. Chest pain, unspecified type Has improved since discharge from the emergency room, with work-up there unremarkable as noted above.  That  included a CT angio to rule out pulmonary embolus as well as a Doppler study to rule out blood clot in the leg. She has plans to follow-up with cardiology tomorrow and can do so. Await their further input.  2. Sprain of ulnar collateral ligament of metacarpophalangeal (MCP) joint of right thumb, initial encounter Educated on her thumb ligament sprain, and do not feel this is a complete tear of that ligament based on clinical assessment Do feel a spica splint could would be helpful, and recommended she obtain 1 and can do so at a pharmacy, and utilize for the next 1 to 2  weeks, and then wean from that splint after. Also recommended ice topically a couple times a day to help. If symptoms not improving or more problematic, she needs to follow-up   3. Primary osteoarthritis of right hip Her symptoms have again improved after the emergency room visit. Continue the Tylenol products presently And noted if she has some flare concerns in the future, can use the meloxicam product once daily with food when that occurs, and then when symptoms are better, would just use the Tylenol type product as needed.  Noted concerns with long-term chronic use of meloxicam product  4. Perennial allergic rhinitis with seasonal variation Noted some type of steroid nasal spray would be helpful, and recommended a Flonase entity once daily.  She had been on Nasacort product in the past, although she noted she was on a nasal spray now that started within a and think it might be an antihistamine.  Do feel the nasal steroid spray will be helpful, and await her response.  5.  Encounter for follow-up at a hospital.. Work-up and results from that were reviewed     Towanda Malkin, MD 06/08/20 11:45 AM

## 2020-06-07 NOTE — Progress Notes (Signed)
Pt present for annual exam. Pt stated that she was doing well no problems.  

## 2020-06-08 ENCOUNTER — Encounter: Payer: Self-pay | Admitting: Obstetrics and Gynecology

## 2020-06-08 ENCOUNTER — Ambulatory Visit (INDEPENDENT_AMBULATORY_CARE_PROVIDER_SITE_OTHER): Payer: 59 | Admitting: Obstetrics and Gynecology

## 2020-06-08 ENCOUNTER — Other Ambulatory Visit: Payer: Self-pay

## 2020-06-08 ENCOUNTER — Ambulatory Visit (INDEPENDENT_AMBULATORY_CARE_PROVIDER_SITE_OTHER): Payer: 59 | Admitting: Internal Medicine

## 2020-06-08 ENCOUNTER — Other Ambulatory Visit (HOSPITAL_COMMUNITY)
Admission: RE | Admit: 2020-06-08 | Discharge: 2020-06-08 | Disposition: A | Discharge status: Home / Self Care | Payer: 59 | Source: Ambulatory Visit | Attending: Obstetrics and Gynecology | Admitting: Obstetrics and Gynecology

## 2020-06-08 ENCOUNTER — Encounter: Payer: Self-pay | Admitting: Internal Medicine

## 2020-06-08 VITALS — BP 121/87 | HR 91 | Ht 61.0 in | Wt 140.4 lb

## 2020-06-08 VITALS — BP 122/82 | HR 80 | Temp 98.5°F | Resp 16 | Ht 61.0 in | Wt 139.4 lb

## 2020-06-08 DIAGNOSIS — Z8742 Personal history of other diseases of the female genital tract: Secondary | ICD-10-CM

## 2020-06-08 DIAGNOSIS — Z87898 Personal history of other specified conditions: Secondary | ICD-10-CM

## 2020-06-08 DIAGNOSIS — Z09 Encounter for follow-up examination after completed treatment for conditions other than malignant neoplasm: Secondary | ICD-10-CM | POA: Diagnosis not present

## 2020-06-08 DIAGNOSIS — J3089 Other allergic rhinitis: Secondary | ICD-10-CM | POA: Diagnosis not present

## 2020-06-08 DIAGNOSIS — M1611 Unilateral primary osteoarthritis, right hip: Secondary | ICD-10-CM | POA: Diagnosis not present

## 2020-06-08 DIAGNOSIS — N952 Postmenopausal atrophic vaginitis: Secondary | ICD-10-CM

## 2020-06-08 DIAGNOSIS — R079 Chest pain, unspecified: Secondary | ICD-10-CM

## 2020-06-08 DIAGNOSIS — Z01419 Encounter for gynecological examination (general) (routine) without abnormal findings: Secondary | ICD-10-CM | POA: Diagnosis not present

## 2020-06-08 DIAGNOSIS — N951 Menopausal and female climacteric states: Secondary | ICD-10-CM | POA: Diagnosis not present

## 2020-06-08 DIAGNOSIS — J302 Other seasonal allergic rhinitis: Secondary | ICD-10-CM

## 2020-06-08 DIAGNOSIS — S63641A Sprain of metacarpophalangeal joint of right thumb, initial encounter: Secondary | ICD-10-CM | POA: Diagnosis not present

## 2020-06-08 NOTE — Patient Instructions (Signed)
Keep the cardiology appointment tomorrow as planned.  Would recommend a spica splint for the thumb, use for the next 1 to 2 weeks to help with the ligament sprain in the thumb, and also apply ice to the area a couple times daily at 10 to 15-minute intervals can be helpful.  Can use the meloxicam product once daily in the morning, take with food, when the arthritis symptoms in the hip area are more problematic.  Can use with the Tylenol product later in the day after taking the meloxicam.  Also would recommend a Flonase (generic is fluticasone) nasal spray to help with some allergic rhinitis symptoms, using this once daily.

## 2020-06-08 NOTE — Progress Notes (Signed)
ANNUAL PREVENTATIVE CARE GYNECOLOGY  ENCOUNTER NOTE Subjective:       Makayla Mason is a 54 y.o. [phone] menopausal female here for a routine annual gynecologic exam. The patient is not sexually active. The patient has never taken hormone replacement therapy. Patient denies post-menopausal vaginal bleeding. The patient wears seatbelts: yes. The patient participates in regular exercise: no. Has the patient ever been transfused or tattooed?: no.   Current complaints: 1. None.  She does note that she was feeling an initial episode of chest pain and went to the ER 2 days ago. Notes initial workup was negative. Report that she has an appointment with PCP and Cardiologist soon for further evaluation.    Gynecologic History No LMP recorded. Patient is postmenopausal. Contraception: post menopausal status Last Pap: 06/05/2019. Results were: normal.  Has history of abnormal pap smears in the past (LGSIL in 2018, NILM but HPV+ pap smear in 2019). Last mammogram: 05/12/2020. Results were: normal Last Colonoscopy: 02/15/2018.  Results were: normal.   Obstetric History OB History  Gravida Para Term Preterm AB Living  2 2 2     2   SAB TAB Ectopic Multiple Live Births          2    # Outcome Date GA Lbr Len/2nd Weight Sex Delivery Anes PTL Lv  2 Term 1997    F Vag-Spont   LIV  1 Term 1995    F Vag-Spont   LIV    Past Medical History:  Diagnosis Date  . Allergic rhinitis, seasonal   . Mild scoliosis   . TMJ arthralgia    right side    Family History  Problem Relation Age of Onset  . Breast cancer Cousin   . Diabetes Mother   . Diabetes Father   . Hypertension Father   . Asthma Daughter   . Anemia Maternal Aunt   . Stroke Maternal Aunt   . Heart disease Maternal Grandmother   . Kidney disease Maternal Grandmother   . Hypertension Maternal Grandmother     Past Surgical History:  Procedure Laterality Date  . COLONOSCOPY WITH PROPOFOL N/A 02/15/2018   Procedure: COLONOSCOPY WITH  PROPOFOL;  Surgeon: Jonathon Bellows, MD;  Location: [facility] ENDOSCOPY;  Service: Gastroenterology;  Laterality: N/A;    Social History   Socioeconomic History  . Marital status: Divorced    Spouse name: Not on file  . Number of children: 2  . Years of education: Not on file  . Highest education level: Bachelor's degree (e.g., BA, AB, BS)  Occupational History  . Not on file  Tobacco Use  . Smoking status: Never Smoker  . Smokeless tobacco: Never Used  Vaping Use  . Vaping Use: Never used  Substance and Sexual Activity  . Alcohol use: Yes    Alcohol/week: 0.0 standard drinks    Comment: occasional  . Drug use: No  . Sexual activity: Not Currently    Birth control/protection: Post-menopausal  Other Topics Concern  . Not on file  Social History Narrative   Lives in [place], grown daughters and grandson are at home   Social Determinants of Health   Financial Resource Strain: Low Risk   . Difficulty of Paying Living Expenses: Not hard at all  Food Insecurity: No Food Insecurity  . Worried About Charity fundraiser in the Last Year: Never true  . Ran Out of Food in the Last Year: Never true  Transportation Needs: No Transportation Needs  . Lack of Transportation (Medical):  No  . Lack of Transportation (Non-Medical): No  Physical Activity: Sufficiently Active  . Days of Exercise per Week: 2 days  . Minutes of Exercise per Session: 120 min  Stress: No Stress Concern Present  . Feeling of Stress : Not at all  Social Connections: Moderately Integrated  . Frequency of Communication with Friends and Family: More than three times a week  . Frequency of Social Gatherings with Friends and Family: More than three times a week  . Attends Religious Services: More than 4 times per year  . Active Member of Clubs or Organizations: Yes  . Attends Archivist Meetings: More than 4 times per year  . Marital Status: Divorced  Human resources officer Violence: Not At Risk  . Fear of Current  or Ex-Partner: No  . Emotionally Abused: No  . Physically Abused: No  . Sexually Abused: No    Current Outpatient Medications on File Prior to Visit  Medication Sig Dispense Refill  . meloxicam (MOBIC) 15 MG tablet Take 1 tablet (15 mg total) by mouth daily. 30 tablet 1  . montelukast (SINGULAIR) 10 MG tablet Take 1 tablet (10 mg total) by mouth at bedtime. 90 tablet 1  . omeprazole (PRILOSEC) 40 MG capsule Take 1 capsule (40 mg total) by mouth daily. 90 capsule 1  . triamcinolone (NASACORT ALLERGY 24HR) 55 MCG/ACT AERO nasal inhaler Place 1 spray into the nose daily. 16.9 mL 3  . pregabalin (LYRICA) 50 MG capsule Take 1-2 capsules (50-100 mg total) by mouth at bedtime. (Patient not taking: Reported on 06/08/2020) 60 capsule 0   No current facility-administered medications on file prior to visit.    Allergies  Allergen Reactions  . Codeine Nausea And Vomiting      Review of Systems ROS General ROS: negative for - chills, fatigue, fever, weight gain or weight loss.  Positive for occasional night sweats, hot flushes. One episode of chills.  Psychological ROS: negative for - anxiety, decreased libido, depression, mood swings, physical abuse or sexual abuse Ophthalmic ROS: negative for - blurry vision, eye pain or loss of vision ENT ROS: negative for - headaches, hearing change, visual changes or vocal changes Allergy and Immunology ROS: negative for - hives, itchy/watery eyes or seasonal allergies Hematological and Lymphatic ROS: negative for - bleeding problems, bruising, swollen lymph nodes or weight loss Endocrine ROS: negative for - galactorrhea, hair pattern changes, hot flashes, malaise/lethargy, mood swings, palpitations, polydipsia/polyuria, skin changes, temperature intolerance or unexpected weight changes Breast ROS: negative for - new or changing breast lumps or nipple discharge Respiratory ROS: negative for - cough or shortness of breath Cardiovascular ROS: negative for -  chest pain, irregular heartbeat, palpitations or shortness of breath Gastrointestinal ROS: no abdominal pain, change in bowel habits, or black or bloody stools Genito-Urinary ROS: no dysuria, trouble voiding, or hematuria Musculoskeletal ROS: negative for - joint pain or joint stiffness Neurological ROS: negative for - bowel and bladder control changes Dermatological ROS: negative for rash and skin lesion changes   Objective:   BP 121/87   Pulse 91   Ht 5\' 1"  (1.549 m)   Wt 140 lb 6.4 oz (63.7 kg)   BMI 26.53 kg/m  CONSTITUTIONAL: Well-developed, well-nourished female in no acute distress.  PSYCHIATRIC: Normal mood and affect. Normal behavior. Normal judgment and thought content. [place]: Alert and oriented to person, place, and time. Normal muscle tone coordination. No cranial nerve deficit noted. HENT:  Normocephalic, atraumatic, External right and left ear normal. Oropharynx is clear  and moist EYES: Conjunctivae and EOM are normal. Pupils are equal, round, and reactive to light. No scleral icterus.  NECK: Normal range of motion, supple, no masses.  Normal thyroid.  SKIN: Skin is warm and dry. No rash noted. Not diaphoretic. No erythema. No pallor. CARDIOVASCULAR: Normal heart rate noted, regular rhythm, no murmur. RESPIRATORY: Clear to auscultation bilaterally. Effort and breath sounds normal, no problems with respiration noted. BREASTS: Symmetric in size. No masses, skin changes, nipple drainage, or lymphadenopathy. ABDOMEN: Soft, normal bowel sounds, no distention noted.  No tenderness, rebound or guarding.  BLADDER: Normal PELVIC:  Bladder no bladder distension noted  Urethra: normal appearing urethra with no masses, tenderness or lesions  Vulva: not indicated  Vagina: mildly atrophic mucosa, no lesions or vaginal discharge.   Cervix: normal appearing cervix without discharge or lesions  Uterus: uterus is normal size, shape, consistency and nontender  Adnexa: normal adnexa  in size, nontender and no masses  RV: External Exam NormaI, No Rectal Masses and Normal Sphincter tone  MUSCULOSKELETAL: Normal range of motion. No tenderness.  No cyanosis, clubbing, or edema.  2+ distal pulses. LYMPHATIC: No Axillary, Supraclavicular, or Inguinal Adenopathy.   Labs: Lab Results  Component Value Date   WBC 13.9 (H) 06/06/2020   HGB 13.5 06/06/2020   HCT 41.9 06/06/2020   MCV 89.9 06/06/2020   PLT 286 06/06/2020    Lab Results  Component Value Date   CREATININE 0.77 06/06/2020   BUN 21 (H) 06/06/2020   NA 140 06/06/2020   K 3.5 06/06/2020   CL 104 06/06/2020   CO2 25 06/06/2020    Lab Results  Component Value Date   ALT 10 12/04/2018   AST 12 12/04/2018   ALKPHOS 88 02/01/2017   BILITOT 0.5 12/04/2018    Lab Results  Component Value Date   CHOL 181 12/04/2018   HDL 53 12/04/2018   LDLCALC 107 (H) 12/04/2018   TRIG 114 12/04/2018   CHOLHDL 3.4 12/04/2018    No results found for: TSH   Lab Results  Component Value Date   HGBA1C 5.4 12/05/2019     Assessment:   1. Encounter for well woman exam with routine gynecological exam   2. Menopausal vasomotor syndrome   3. History of chest pain at rest   4. Vaginal atrophy   5. History of abnormal cervical Pap smear     Plan:  - Pap: Pap Co Test performed in light of history of abnormal pap smears - Mammogram: Up to date.  - Stool Guaiac Testing:  Not Indicated. Patient up to date with colonoscopy.  - Labs: Reviewed labs from ER visit. Also ordered Lipid panel and TSH.  - Routine preventative health maintenance measures emphasized: Exercise/Diet/Weight control, Tobacco Warnings, Alcohol/Substance use risks, Stress Management, Peer Pressure Issues and Safe Sex - Patient with mildly bothersome menopausal vasomotor symptoms. Continue lifestyle interventions.  - History of chest pain at rest, initial episode. To f/u with PCP and Cardiology appointments as scheduled.  - Vaginal atrophy not  bothersome to patient. No treatment indicated. - COVID vaccination series completed (see Epic documentation)  - RTC in 1 year for annual exam.    Return to [place], MD  [facility]

## 2020-06-08 NOTE — Addendum Note (Signed)
Addended by: Edwyna Shell on: 06/08/2020 10:07 AM   Modules accepted: Orders

## 2020-06-09 ENCOUNTER — Encounter: Payer: Self-pay | Admitting: Internal Medicine

## 2020-06-09 ENCOUNTER — Encounter: Payer: Self-pay | Admitting: *Deleted

## 2020-06-09 ENCOUNTER — Ambulatory Visit (INDEPENDENT_AMBULATORY_CARE_PROVIDER_SITE_OTHER): Payer: 59 | Admitting: Internal Medicine

## 2020-06-09 VITALS — BP 118/80 | HR 86 | Ht 61.0 in | Wt 140.2 lb

## 2020-06-09 DIAGNOSIS — R079 Chest pain, unspecified: Secondary | ICD-10-CM | POA: Diagnosis not present

## 2020-06-09 LAB — LIPID PANEL
Chol/HDL Ratio: 3.6 ratio (ref 0.0–4.4)
Cholesterol, Total: 187 mg/dL (ref 100–199)
HDL: 52 mg/dL (ref >39)
LDL Chol Calc (NIH): 114 mg/dL — ABNORMAL HIGH (ref 0–99)
Triglycerides: 115 mg/dL (ref 0–149)
VLDL Cholesterol Cal: 21 mg/dL (ref 5–40)

## 2020-06-09 LAB — TSH: TSH: 3.7 u[IU]/mL (ref 0.450–4.500)

## 2020-06-09 NOTE — Progress Notes (Signed)
New Outpatient Visit Date: 06/09/2020  Primary Care provider: Steele Sizer, [place],  [place] [address]  Chief Complaint: Chest pain  HPI:  Makayla Mason is a 54 y.o. female who is being seen today as a self-referral for evaluation of chest pain. She has a history of insulin resistance, GERD, and chronic leg/hip/back pain.  Makayla Mason presented to the [facility] emergency department on 06/25/2020 complaining of right leg/hip pain as well as chest pain.  The leg pain has been a chronic issue.  Chest pain developed that morning and was worsened by deep inspiration.  ED evaluation was notable for sinus tachycardia on EKG but no ischemic changes.  High-sensitivity troponin I was negative x3.  Mild leukocytosis was noted.  CTA of the chest was negative for PE and other acute findings.  Right lower extremity venous duplex was negative for DVT.  Today, Makayla Mason reports feeling well.  The aforementioned sharp chest pain with deep inspiration lasted ~2 days.  It was not exertional.  She was told in the ED that the chest pain was related to her severe right thigh pain, though she questions this.  Her leg pain has also resolved.  Makayla Mason denies exertional chest pain as well as shortness of breath.  She has not had any lightheadedness, edema, or orthopnea.  She notes rare episodes of heart racing at night, which are self-limited and without associated symptoms.  Echocardiogram was performed in 2019 to exclude valvular heart disease, as Makayla Mason's mother has a history of a heart murmur.  Makayla Mason was started on meloxicam in June after she injured her left ankle.  The pain has resolved, though she remains on meloxicam.  She uses omeprazole on an as needed based for indigestion/heartburn.  --------------------------------------------------------------------------------------------------  Cardiovascular History & Procedures: Cardiovascular Problems:  Chest pain  Risk  Factors:  None  Cath/PCI:  None  CV Surgery:  None  EP Procedures and Devices:  None  Non-Invasive Evaluation(s):  TTE (05/15/2018):  Normal LV size with mild LVH.  LVEF 60-65%.  No significant valvular abnormality.  Recent CV Pertinent Labs: Lab Results  Component Value Date   CHOL 187 06/08/2020   HDL 52 06/08/2020   LDLCALC 114 (H) 06/08/2020   LDLCALC 107 (H) 12/04/2018   TRIG 115 06/08/2020   CHOLHDL 3.6 06/08/2020   CHOLHDL 3.4 12/04/2018   K 3.5 06/06/2020   BUN 21 (H) 06/06/2020   BUN 15 03/20/2016   CREATININE 0.77 06/06/2020   CREATININE 0.63 12/04/2018    --------------------------------------------------------------------------------------------------  Past Medical History:  Diagnosis Date  . Allergic rhinitis, seasonal   . Mild scoliosis   . TMJ arthralgia    right side    Past Surgical History:  Procedure Laterality Date  . COLONOSCOPY WITH PROPOFOL N/A 02/15/2018   Procedure: COLONOSCOPY WITH PROPOFOL;  Surgeon: Jonathon Bellows, MD;  Location: [facility] ENDOSCOPY;  Service: Gastroenterology;  Laterality: N/A;    Current Meds  Medication Sig  . meloxicam (MOBIC) 15 MG tablet Take 1 tablet (15 mg total) by mouth daily.  . montelukast (SINGULAIR) 10 MG tablet Take 1 tablet (10 mg total) by mouth at bedtime.  Makayla Mason omeprazole (PRILOSEC) 40 MG capsule Take 1 capsule (40 mg total) by mouth daily.  Makayla Mason triamcinolone (NASACORT ALLERGY 24HR) 55 MCG/ACT AERO nasal inhaler Place 1 spray into the nose daily.    Allergies: Codeine  Social History   Tobacco Use  . Smoking status: Never Smoker  . Smokeless tobacco:  Never Used  Vaping Use  . Vaping Use: Never used  Substance Use Topics  . Alcohol use: Yes    Alcohol/week: 1.0 standard drink    Types: 1 Glasses of wine per week  . Drug use: No    Family History  Problem Relation Age of Onset  . Breast cancer Cousin   . Diabetes Mother   . Heart murmur Mother   . Diabetes Father   . Hypertension Father    . Asthma Daughter   . Anemia Maternal Aunt   . Stroke Maternal Aunt   . Kidney disease Maternal Grandmother   . Hypertension Maternal Grandmother   . Heart failure Maternal Grandmother   . Heart attack Maternal Grandfather     Review of Systems: A 12-system review of systems was performed and was negative except as noted in the HPI.  --------------------------------------------------------------------------------------------------  Physical Exam: BP 118/80 (BP Location: Left Arm, Patient Position: Sitting, Cuff Size: Normal)   Pulse 86   Ht 5\' 1"  (1.549 m)   Wt 140 lb 3.2 oz (63.6 kg)   SpO2 92%   BMI 26.49 kg/m   General:  NAD. HEENT: No conjunctival pallor or scleral icterus. Facemask in place. Neck: Supple without lymphadenopathy, thyromegaly, JVD, or HJR. No carotid bruit. Lungs: Normal work of breathing. Clear to auscultation bilaterally without wheezes or crackles. Heart: Regular rate and rhythm without murmurs, rubs, or gallops. Non-displaced PMI. Abd: Bowel sounds present. Soft, NT/ND without hepatosplenomegaly Ext: No lower extremity edema. Radial, PT, and DP pulses are 2+ bilaterally Skin: Warm and dry without rash. Neuro: CNIII-XII intact. Strength and fine-touch sensation intact in upper and lower extremities bilaterally. Psych: Normal mood and affect.  EKG:  Normal sinus rhythm with short PR interval and nonspecific T wave abnormality.  Heart rate has decreased since 06/06/2020.  Otherwise, there has been no significant interval change.  Lab Results  Component Value Date   WBC 13.9 (H) 06/06/2020   HGB 13.5 06/06/2020   HCT 41.9 06/06/2020   MCV 89.9 06/06/2020   PLT 286 06/06/2020    Lab Results  Component Value Date   NA 140 06/06/2020   K 3.5 06/06/2020   CL 104 06/06/2020   CO2 25 06/06/2020   BUN 21 (H) 06/06/2020   CREATININE 0.77 06/06/2020   GLUCOSE 97 06/06/2020   ALT 10 12/04/2018    Lab Results  Component Value Date   CHOL 187  06/08/2020   HDL 52 06/08/2020   LDLCALC 114 (H) 06/08/2020   TRIG 115 06/08/2020   CHOLHDL 3.6 06/08/2020     --------------------------------------------------------------------------------------------------  ASSESSMENT AND PLAN: Chest pain: Quality of chest pain is not consistent with angina and seems more pleuritic in nature.  It has resolved on its own.  EKG today shows non-specific T wave abnormality.  I have personally reviewed her CTA chest from 06/06/20, which was negative for PE.  I do not see any significant coronary artery calcification or pericardial effusion.  I have recommended discontinuation of meloxicam in case gastritis/esophagitis contributed to her chest pain.  I advised her to take omeprazole on a daily basis.  We will defer ischemia testing, given atypical nature of pain and paucity of risk factors.  If she has recurrent pain, we will need to consider non-invasive ischemia testing.  Follow-up: Return to clinic in 1 month.  Nelva Bush, MD 06/09/2020 3:19 PM

## 2020-06-09 NOTE — Patient Instructions (Signed)
Medication Instructions:  Your physician has recommended you make the following change in your medication:  1- STOP Meloxicam. 2- TAKE Omeprazole on a daily basis instead of as needed.   *If you need a refill on your cardiac medications before your next appointment, please call your pharmacy*  Follow-Up: At [facility], you and your health needs are our priority.  As part of our continuing mission to provide you with exceptional heart care, we have created designated Provider Care Teams.  These Care Teams include your primary Cardiologist (physician) and Advanced Practice Providers (APPs -  Physician Assistants and Nurse Practitioners) who all work together to provide you with the care you need, when you need it.  We recommend signing up for the patient portal called "MyChart".  Sign up information is provided on this After Visit Summary.  MyChart is used to connect with patients for Virtual Visits (Telemedicine).  Patients are able to view lab/test results, encounter notes, upcoming appointments, etc.  Non-urgent messages can be sent to your provider as well.   To learn more about what you can do with MyChart, go to [email or web address].    Your next appointment:   1 month(s)  The format for your next appointment:   In Person  Provider:    You may see DR Harrell Gave END or one of the following Advanced Practice Providers on your designated Care Team:    Murray Hodgkins, NP  Christell Faith, PA-C  Marrianne Mood, PA-C

## 2020-06-10 ENCOUNTER — Encounter: Payer: Self-pay | Admitting: Internal Medicine

## 2020-06-11 LAB — CYTOLOGY - PAP
Comment: NEGATIVE
Diagnosis: NEGATIVE
High risk HPV: NEGATIVE

## 2020-06-18 ENCOUNTER — Ambulatory Visit: Payer: 59 | Admitting: Family Medicine

## 2020-07-12 NOTE — Progress Notes (Deleted)
Office Visit    Patient Name: Makayla Mason Date of Encounter: 07/12/2020  Primary Care Provider:  Steele Sizer, MD Primary Cardiologist:  No primary care provider on file. Electrophysiologist:  None   Chief Complaint    Makayla Mason is a 54 y.o. female with a hx of insulin resistance, GERD, chronic leg/hip/back pain presents today for follow-up of chest pain  Past Medical History    Past Medical History:  Diagnosis Date  . Allergic rhinitis, seasonal   . Mild scoliosis   . TMJ arthralgia    right side   Past Surgical History:  Procedure Laterality Date  . COLONOSCOPY WITH PROPOFOL N/A 02/15/2018   Procedure: COLONOSCOPY WITH PROPOFOL;  Surgeon: Jonathon Bellows, MD;  Location: [facility] ENDOSCOPY;  Service: Gastroenterology;  Laterality: N/A;    Allergies  Allergies  Allergen Reactions  . Codeine Nausea And Vomiting    History of Present Illness    Makayla Mason is a 54 y.o. female with a hx of insulin resistance, GERD, chronic leg/hip/back pain last seen 06/09/2020 by Dr. Saunders Revel.  Previous echocardiogram in 2019 with normal LV size, mild LVH, EF 60 to 65%, no significant valvular abnormality.  She was seen as self-referral for chest pain.  Presented to the [facility] ED 06/25/2020 with right leg/hip pain as well as chest pain.  Leg and hip pain has been a chronic issue.  Chest pain developed that morning was worsened by inspiration.  ED evaluation notable for sinus tachycardia, no ischemic changes, high-sensitivity troponin negative x3, mild leukocytosis, CTA chest negative for PE, RLE duplex negative for DVT.  When seen in clinic she reported no exertional chest pain or shortness of breath.  Dr. Saunders Revel reviewed her CT images and noted no significant coronary artery calcification or pericardial effusion.  She was recommended to discontinue meloxicam in case gastritis/esophagitis was contributory.  Ischemic evaluation was deferred.  ***  EKGs/Labs/Other Studies Reviewed:   The  following studies were reviewed today: ***  EKG:  EKG is ordered today.  The ekg ordered today demonstrates ***  Recent Labs: 06/06/2020: BUN 21; Creatinine, Ser 0.77; Hemoglobin 13.5; Platelets 286; Potassium 3.5; Sodium 140 06/08/2020: TSH 3.700  Recent Lipid Panel    Component Value Date/Time   CHOL 187 06/08/2020 1004   TRIG 115 06/08/2020 1004   HDL 52 06/08/2020 1004   CHOLHDL 3.6 06/08/2020 1004   CHOLHDL 3.4 12/04/2018 1018   VLDL 19 02/01/2017 1404   LDLCALC 114 (H) 06/08/2020 1004   LDLCALC 107 (H) 12/04/2018 1018    Home Medications   No outpatient medications have been marked as taking for the 07/13/20 encounter (Appointment) with Loel Dubonnet, NP.      Review of Systems    ***   ROS All other systems reviewed and are otherwise negative except as noted above.  Physical Exam    VS:  There were no vitals taken for this visit. , BMI There is no height or weight on file to calculate BMI. GEN: Well nourished, well developed, in no acute distress. HEENT: normal. Neck: Supple, no JVD, carotid bruits, or masses. Cardiac: ***RRR, no murmurs, rubs, or gallops. No clubbing, cyanosis, edema.  ***Radials/DP/PT 2+ and equal bilaterally.  Respiratory:  ***Respirations regular and unlabored, clear to auscultation bilaterally. GI: Soft, nontender, nondistended, BS + x 4. MS: No deformity or atrophy. Skin: Warm and dry, no rash. Neuro:  Strength and sensation are intact. Psych: Normal affect.  Assessment & Plan    1.  Chest pain -  2. HLD -lipid panel 06/07/2020 total cholesterol 187, HDL 52, LDL 114, triglycerides 115.  Disposition: Follow up {follow up:15908} with Dr. Saunders Revel or APP   Loel Dubonnet, NP 07/12/2020, 8:08 PM

## 2020-07-13 ENCOUNTER — Ambulatory Visit: Payer: 59 | Admitting: Family

## 2020-07-19 NOTE — Progress Notes (Signed)
Name: Makayla Mason   MRN: [id number]    DOB: February 26, 1966   Date:07/21/2020       Progress Note  Subjective  Chief Complaint  Follow up   HPI    Insulin Resistance: She denies polyphagia, polyuria or polyphagia.Last A1C was down to 5.4 % today is up to 5.8 %, she states has been drinking more sodas lately, 2 cans of Dr. Samson Frederic per day, but she will try to decrease to once daily. She also likes desserts, about twice a day   The 10-year ASCVD risk score Mikey Bussing DC Brooke Bonito., et al., 2013) is: 2.6%   Values used to calculate the score:     Age: 54 years     Sex: Female     Is Non-Hispanic African American: Yes     Diabetic: No     Tobacco smoker: No     Systolic Blood Pressure: 924 mmHg     Is BP treated: No     HDL Cholesterol: 52 mg/dL     Total Cholesterol: 187 mg/dL  GERD: she takes Omeprazole before dinner , and symptoms are controlled, no heartburn or regurgitation, unless she eats spicy food or fried food.   Perennial AR:she started allergy shots Fall 2019 with ENT, sees Dr. Ladene Artist , she took a pause on allergy shots since symptoms under control. She denies nasal congestion, rhinorrhea,  wheezing or SOB  TMJ: pain when she yawns and sometimes when she gets up, she is avoiding chewing gum, and is doing well. Unchanged   OA right knee and hip: seen by Dr. Rudene Christians in the past and had X-rays, she states pain has been getting progressively worse, she went to [facility] with severe pain, negative for DVT, CT angio negative, elevated WBC, we will recheck white count, needs to follow up with Dr. Rudene Christians  Patient Active Problem List   Diagnosis Date Noted  . Sprain of ulnar collateral ligament of metacarpophalangeal (MCP) joint of right thumb 06/08/2020  . Cervical radicular pain 07/15/2018  . Overweight (BMI 25.0-29.9) 01/29/2018  . Right hip pain 01/29/2018  . Insulin resistance 02/28/2017  . Low grade squamous intraepith lesion on cytologic smear cervix (lgsil) 02/28/2017  . GERD without  esophagitis 03/15/2016  . Left ankle pain 03/15/2016  . Allergic rhinitis, seasonal 05/21/2015  . Scoliosis 05/21/2015  . 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 COLONOSCOPY WITH PROPOFOL N/A 02/15/2018   Procedure: COLONOSCOPY WITH PROPOFOL;  Surgeon: Jonathon Bellows, MD;  Location: [facility] ENDOSCOPY;  Service: Gastroenterology;  Laterality: N/A;    Family History  Problem Relation Age of Onset  . Breast cancer Cousin   . Diabetes Mother   . Heart murmur Mother   . Diabetes Father   . Hypertension Father   . Asthma Daughter   . Anemia Maternal Aunt   . Stroke Maternal Aunt   . Kidney disease Maternal Grandmother   . Hypertension Maternal Grandmother   . Heart failure Maternal Grandmother   . Heart attack Maternal Grandfather     Social History   Tobacco Use  . Smoking status: Never Smoker  . Smokeless tobacco: Never Used  Substance Use Topics  . Alcohol use: Yes    Alcohol/week: 1.0 standard drink    Types: 1 Glasses of wine per week     Current Outpatient Medications:  .  montelukast (SINGULAIR) 10 MG tablet, Take 1 tablet (10 mg total) by mouth at bedtime., Disp: 90 tablet,  Rfl: 1 .  omeprazole (PRILOSEC) 40 MG capsule, Take 1 capsule (40 mg total) by mouth daily., Disp: 90 capsule, Rfl: 1 .  triamcinolone (NASACORT ALLERGY 24HR) 55 MCG/ACT AERO nasal inhaler, Place 1 spray into the nose daily., Disp: 16.9 mL, Rfl: 3  Allergies  Allergen Reactions  . Codeine Nausea And Vomiting    I personally reviewed active problem list, medication list, allergies, family history, social history, health maintenance with the patient/caregiver today.   ROS  Constitutional: Negative for fever or weight change.  Respiratory: Negative for cough and shortness of breath.   Cardiovascular: Negative for chest pain or palpitations.  Gastrointestinal: Negative for abdominal pain, no bowel changes.  Musculoskeletal: Negative  for gait problem or joint swelling.  Skin: Negative for rash.  Neurological: Negative for dizziness or headache.  No other specific complaints in a complete review of systems (except as listed in HPI above).  Objective  Vitals:   07/21/20 1045  BP: 116/76  Pulse: 89  Resp: 14  Temp: 98.1 F (36.7 C)  TempSrc: Oral  SpO2: 96%  Weight: 141 lb 1.6 oz (64 kg)  Height: 5\' 1"  (1.549 m)    Body mass index is 26.66 kg/m.  Physical Exam  Constitutional: Patient appears well-developed and well-nourished. Overweight.  No distress.  HEENT: head atraumatic, normocephalic, pupils equal and reactive to light,neck supple Cardiovascular: Normal rate, regular rhythm and normal heart sounds.  No murmur heard. No BLE edema. Pulmonary/Chest: Effort normal and breath sounds normal. No respiratory distress. Abdominal: Soft.  There is no tenderness. Psychiatric: Patient has a normal mood and affect. behavior is normal. Judgment and thought content normal. Muscular Skeletal: positive pain on groin with internal rotation of hip  Recent Results (from the past 2160 hour(s))  Basic metabolic panel     Status: Abnormal   Collection Time: 06/06/20  1:05 AM  Result Value Ref Range   Sodium 140 135 - 145 mmol/L   Potassium 3.5 3.5 - 5.1 mmol/L   Chloride 104 98 - 111 mmol/L   CO2 25 22 - 32 mmol/L   Glucose, Bld 97 70 - 99 mg/dL    Comment: Glucose reference range applies only to samples taken after fasting for at least 8 hours.   BUN 21 (H) 6 - 20 mg/dL   Creatinine, Ser 0.77 0.44 - 1.00 mg/dL   Calcium 9.2 8.9 - 10.3 mg/dL   GFR calc non Af Amer >60 >60 mL/min   GFR calc Af Amer >60 >60 mL/min   Anion gap 11 5 - 15    Comment: Performed at [facility], [address] [place], [place] [address]  CBC     Status: Abnormal   Collection Time: 06/06/20  1:05 AM  Result Value Ref Range   WBC 13.9 (H) 4.0 - 10.5 K/uL   RBC 4.66 3.87 - 5.11 MIL/uL   Hemoglobin 13.5 12.0 - 15.0 g/dL   HCT  41.9 36 - 46 %   MCV 89.9 80.0 - 100.0 fL   MCH 29.0 26.0 - 34.0 pg   MCHC 32.2 30.0 - 36.0 g/dL   RDW 14.2 11.5 - 15.5 %   Platelets 286 150 - 400 K/uL   nRBC 0.0 0.0 - 0.2 %    Comment: Performed at [facility], [address] [place], [place] [address]  Troponin I (High Sensitivity)     Status: None   Collection Time: 06/06/20  1:05 AM  Result Value Ref Range   Troponin  I (High Sensitivity) 4 <18 ng/L    Comment: (NOTE) Elevated high sensitivity troponin I (hsTnI) values and significant  changes across serial measurements may suggest ACS but many other  chronic and acute conditions are known to elevate hsTnI results.  Refer to the "Links" section for chest pain algorithms and additional  guidance. Performed at [facility], [place], [place] [address]   Troponin I (High Sensitivity)     Status: None   Collection Time: 06/06/20  5:35 AM  Result Value Ref Range   Troponin I (High Sensitivity) 3 <18 ng/L    Comment: (NOTE) Elevated high sensitivity troponin I (hsTnI) values and significant  changes across serial measurements may suggest ACS but many other  chronic and acute conditions are known to elevate hsTnI results.  Refer to the "Links" section for chest pain algorithms and additional  guidance. Performed at [facility], [address] [place], [place] [address]   TSH     Status: None   Collection Time: 06/08/20 10:04 AM  Result Value Ref Range   TSH 3.700 0.450 - 4.500 uIU/mL  Lipid panel     Status: Abnormal   Collection Time: 06/08/20 10:04 AM  Result Value Ref Range   Cholesterol, Total 187 100 - 199 mg/dL   Triglycerides 115 0 - 149 mg/dL   HDL 52 >39 mg/dL   VLDL Cholesterol Cal 21 5 - 40 mg/dL   LDL Chol Calc (NIH) 114 (H) 0 - 99 mg/dL   Chol/HDL Ratio 3.6 0.0 - 4.4 ratio    Comment:                                   T. Chol/HDL Ratio                                             Men  Women                                1/2 Avg.Risk  3.4    3.3                                   Avg.Risk  5.0    4.4                                2X Avg.Risk  9.6    7.1                                3X Avg.Risk 23.4   11.0   Cytology - PAP     Status: None   Collection Time: 06/08/20 10:07 AM  Result Value Ref Range   High risk HPV Negative    Adequacy      Satisfactory for evaluation; transformation zone component PRESENT.   Diagnosis      - Negative for intraepithelial lesion or malignancy (NILM)   Comment Normal Reference Range HPV - Negative   POCT HgB A1C     Status: Abnormal   Collection Time: 07/21/20 10:48 AM  Result Value Ref Range   Hemoglobin A1C 5.8 (A) 4.0 - 5.6 %   HbA1c POC (<> result, manual entry)     HbA1c, POC (prediabetic range)     HbA1c, POC (controlled diabetic range)     PHQ2/9: Depression screen [facility] 2/9 07/21/2020 06/08/2020 05/04/2020 12/05/2019 06/10/2019  Decreased Interest 0 0 0 0 0  Down, Depressed, Hopeless 0 0 0 0 0  PHQ - 2 Score 0 0 0 0 0  Altered sleeping - 0 0 0 0  Tired, decreased energy - 0 0 0 0  Change in appetite - 0 0 0 0  Feeling bad or failure about yourself  - 0 0 0 0  Trouble concentrating - 0 0 0 0  Moving slowly or fidgety/restless - 0 0 0 0  Suicidal thoughts - 0 0 0 0  PHQ-9 Score - 0 0 0 0  Difficult doing work/chores - Not difficult at all Not difficult at all - -  Some recent data might be hidden    phq 9 is negative   Fall Risk: Fall Risk  07/21/2020 06/08/2020 05/04/2020 12/05/2019 07/08/2019  Falls in the past year? 0 0 0 0 0  Number falls in past yr: 0 0 0 0 0  Injury with Fall? 0 0 0 0 0  Comment - - - - -  Follow up - - - - -    Functional Status Survey: Is the patient deaf or have difficulty hearing?: No Does the patient have difficulty seeing, even when wearing glasses/contacts?: No Does the patient have difficulty concentrating, remembering, or making decisions?: No Does the patient have difficulty walking or climbing stairs?: No Does  the patient have difficulty dressing or bathing?: No Does the patient have difficulty doing errands alone such as visiting a doctor's office or shopping?: No    Assessment & Plan  1. Prediabetes  - POCT HgB A1C  2. Perennial allergic rhinitis with seasonal variation  - montelukast (SINGULAIR) 10 MG tablet; Take 1 tablet (10 mg total) by mouth at bedtime.  Dispense: 90 tablet; Refill: 1 - triamcinolone (NASACORT ALLERGY 24HR) 55 MCG/ACT AERO nasal inhaler; Place 1 spray into the nose daily.  Dispense: 3 each; Refill: 1  3. Need for immunization against influenza  - Flu Vaccine QUAD 6+ mos PF IM (Fluarix Quad PF)  4. GERD without esophagitis  - omeprazole (PRILOSEC) 40 MG capsule; Take 1 capsule (40 mg total) by mouth daily.  Dispense: 90 capsule; Refill: 1  5. Leukocytosis, unspecified type  - CBC  6. Chronic right hip pain  - Ambulatory referral to Orthopedic Surgery  7. Chronic pain of right knee  - Ambulatory referral to Orthopedic Surgery

## 2020-07-21 ENCOUNTER — Other Ambulatory Visit: Payer: Self-pay

## 2020-07-21 ENCOUNTER — Other Ambulatory Visit: Payer: Self-pay | Admitting: Family Medicine

## 2020-07-21 ENCOUNTER — Ambulatory Visit (INDEPENDENT_AMBULATORY_CARE_PROVIDER_SITE_OTHER): Payer: 59 | Admitting: Family Medicine

## 2020-07-21 ENCOUNTER — Encounter: Payer: Self-pay | Admitting: Family Medicine

## 2020-07-21 VITALS — BP 116/76 | HR 89 | Temp 98.1°F | Resp 14 | Ht 61.0 in | Wt 141.1 lb

## 2020-07-21 DIAGNOSIS — J3089 Other allergic rhinitis: Secondary | ICD-10-CM

## 2020-07-21 DIAGNOSIS — R7303 Prediabetes: Secondary | ICD-10-CM | POA: Diagnosis not present

## 2020-07-21 DIAGNOSIS — Z23 Encounter for immunization: Secondary | ICD-10-CM | POA: Diagnosis not present

## 2020-07-21 DIAGNOSIS — M25561 Pain in right knee: Secondary | ICD-10-CM

## 2020-07-21 DIAGNOSIS — J302 Other seasonal allergic rhinitis: Secondary | ICD-10-CM | POA: Diagnosis not present

## 2020-07-21 DIAGNOSIS — D72829 Elevated white blood cell count, unspecified: Secondary | ICD-10-CM

## 2020-07-21 DIAGNOSIS — M25551 Pain in right hip: Secondary | ICD-10-CM | POA: Diagnosis not present

## 2020-07-21 DIAGNOSIS — G8929 Other chronic pain: Secondary | ICD-10-CM

## 2020-07-21 DIAGNOSIS — K219 Gastro-esophageal reflux disease without esophagitis: Secondary | ICD-10-CM | POA: Diagnosis not present

## 2020-07-21 LAB — POCT GLYCOSYLATED HEMOGLOBIN (HGB A1C): Hemoglobin A1C: 5.8 % — AB (ref 4.0–5.6)

## 2020-07-21 LAB — CBC
HCT: 42.4 % (ref 35.0–45.0)
Hemoglobin: 13.5 g/dL (ref 11.7–15.5)
MCH: 28.3 pg (ref 27.0–33.0)
MCHC: 31.8 g/dL — ABNORMAL LOW (ref 32.0–36.0)
MCV: 88.9 fL (ref 80.0–100.0)
MPV: 10.1 fL (ref 7.5–12.5)
Platelets: 397 10*3/uL (ref 140–400)
RBC: 4.77 10*6/uL (ref 3.80–5.10)
RDW: 13.5 % (ref 11.0–15.0)
WBC: 9.4 10*3/uL (ref 3.8–10.8)

## 2020-07-21 MED ORDER — MONTELUKAST SODIUM 10 MG PO TABS: 10.0000 mg | ORAL_TABLET | Freq: Every day | ORAL | 1 refills | Status: DC

## 2020-07-21 MED ORDER — OMEPRAZOLE 40 MG PO CPDR: 40.0000 mg | DELAYED_RELEASE_CAPSULE | Freq: Every day | ORAL | 1 refills | Status: DC

## 2020-07-21 MED ORDER — TRIAMCINOLONE ACETONIDE 55 MCG/ACT NA AERO: 1.0000 | INHALATION_SPRAY | Freq: Every day | NASAL | 1 refills | Status: DC

## 2020-07-21 NOTE — Patient Instructions (Signed)

## 2020-09-08 ENCOUNTER — Telehealth: Payer: Self-pay

## 2020-09-08 NOTE — Telephone Encounter (Signed)
Copied from [place] [phone]. Topic: General - Inquiry >> Sep 08, 2020  3:42 PM Alease Frame wrote: Reason for CRM: Pt was referred to a Dr Earleen Newport for  Arthritis she mentioned she never received a call or an appt for referral . Please advise

## 2020-09-10 ENCOUNTER — Other Ambulatory Visit: Payer: Self-pay

## 2020-09-10 ENCOUNTER — Ambulatory Visit (INDEPENDENT_AMBULATORY_CARE_PROVIDER_SITE_OTHER): Payer: 59 | Admitting: Family Medicine

## 2020-09-10 ENCOUNTER — Encounter: Payer: Self-pay | Admitting: Family Medicine

## 2020-09-10 VITALS — BP 120/72 | HR 73 | Temp 98.0°F | Resp 16 | Ht 61.0 in | Wt 141.2 lb

## 2020-09-10 DIAGNOSIS — G8929 Other chronic pain: Secondary | ICD-10-CM

## 2020-09-10 DIAGNOSIS — R350 Frequency of micturition: Secondary | ICD-10-CM

## 2020-09-10 DIAGNOSIS — M25551 Pain in right hip: Secondary | ICD-10-CM | POA: Diagnosis not present

## 2020-09-10 DIAGNOSIS — M25561 Pain in right knee: Secondary | ICD-10-CM | POA: Diagnosis not present

## 2020-09-10 DIAGNOSIS — M25521 Pain in right elbow: Secondary | ICD-10-CM | POA: Diagnosis not present

## 2020-09-10 LAB — POCT URINALYSIS DIPSTICK
Bilirubin, UA: NEGATIVE
Blood, UA: NEGATIVE
Glucose, UA: NEGATIVE
Ketones, UA: NEGATIVE
Leukocytes, UA: NEGATIVE
Nitrite, UA: NEGATIVE
Protein, UA: NEGATIVE
Spec Grav, UA: 1.02 (ref 1.010–1.025)
Urobilinogen, UA: 0.2 E.U./dL
pH, UA: 5 (ref 5.0–8.0)

## 2020-09-10 MED ORDER — MELOXICAM 15 MG PO TABS: 15.0000 mg | ORAL_TABLET | Freq: Every day | ORAL | 1 refills | Status: DC

## 2020-09-10 NOTE — Patient Instructions (Addendum)
Referral has been sent to [facility] on 07/21/2020  P: [phone]   Right elbow, rest, avoid strenuous activity, start mobic, ice a few times a day Recommend trying a Adjustable Elbow Support brace or elbow sleeve - can get from [employer] or [employer]   We will contact you about ortho referral- I am going to follow up before I put in a new referral     Elbow Bursitis Bursitis is swelling and pain at the tip of your elbow. This happens when fluid builds up in a sac under your skin (bursa). This may also be called olecranon bursitis. Follow these instructions at home: Medicines  Take over-the-counter and prescription medicines only as told by your doctor.  If you were prescribed an antibiotic, take it exactly as told by your doctor. Do not stop taking it even if you start to feel better. Managing pain, stiffness, and swelling   If told, put ice on your elbow: ? Put ice in a plastic bag. ? Place a towel between your skin and the bag. ? Leave the ice on for 20 minutes, 2-3 times a day.  If your bursitis is caused by an injury, follow instructions from your doctor about: ? Resting your elbow. ? Wearing a bandage.  Wear elbow pads or elbow wraps as needed. These help cushion your elbow. General instructions  Avoid any activities that cause elbow pain. Ask your doctor what activities are safe for you.  Keep all follow-up visits as told by your doctor. This is important. Contact a doctor if you have:  A fever.  Problems that do not get better with treatment.  Pain or swelling that: ? Gets worse. ? Goes away and then comes back.  Pus draining from your elbow. Get help right away if you have:  Trouble moving your arm, hand, or fingers. Summary  Bursitis is swelling and pain at the tip of the elbow.  You may need to take medicine or put ice on your elbow.  Contact your doctor if your problems do not get better with treatment. This information is not  intended to replace advice given to you by your health care provider. Make sure you discuss any questions you have with your health care provider. Document Revised: 10/05/2017 Document Reviewed: 10/02/2017 Elsevier Patient Education  2020 [place] for Routine Care of Injuries Many injuries can be cared for with rest, ice, compression, and elevation (RICE therapy). This includes:  Resting the injured part.  Putting ice on the injury.  Putting pressure (compression) on the injury.  Raising the injured part (elevation). Using RICE therapy can help to lessen pain and swelling. Supplies needed:  Ice.  Plastic bag.  Towel.  Elastic bandage.  Pillow or pillows to raise (elevate) your injured body part. How to care for your injury with RICE therapy Rest Limit your normal activities, and try not to use the injured part of your body. You can go back to your normal activities when your doctor says it is okay to do them and you feel okay. Ask your doctor if you should do exercises to help your injury get better. Ice Put ice on the injured area. Do not put ice on your bare skin.  Put ice in a plastic bag.  Place a towel between your skin and the bag.  Leave the ice on for 20 minutes, 2-3 times a day. Use ice on as many days as told by your doctor.  Compression Compression means putting  pressure on the injured area. This can be done with an elastic bandage. If an elastic bandage has been put on your injury:  Do not wrap the bandage too tight. Wrap the bandage more loosely if part of your body away from the bandage is blue, swollen, cold, painful, or loses feeling (gets numb).  Take off the bandage and put it on again. Do this every 3-4 hours or as told by your doctor.  See your doctor if the bandage seems to make your problems worse.  Elevation Elevation means keeping the injured area raised. If you can, raise the injured area above your heart or the center of  your chest. Contact a doctor if:  You keep having pain and swelling.  Your symptoms get worse. Get help right away if:  You have sudden bad pain at your injury or lower than your injury.  You have redness or more swelling around your injury.  You have tingling or numbness at your injury or lower than your injury, and it does not go away when you take off the bandage. Summary  Many injuries can be cared for using rest, ice, compression, and elevation (RICE therapy).  You can go back to your normal activities when you feel okay and your doctor says it is okay.  Put ice on the injured area as told by your doctor.  Get help if your symptoms get worse or if you keep having pain and swelling. This information is not intended to replace advice given to you by your health care provider. Make sure you discuss any questions you have with your health care provider. Document Revised: 07/13/2017 Document Reviewed: 07/13/2017 Elsevier Patient Education  [place].

## 2020-09-10 NOTE — Progress Notes (Signed)
Patient ID: Makayla Mason, female    DOB: 1966-02-04, 54 y.o.   MRN: [id number]  PCP: Steele Sizer, MD  Chief Complaint  Patient presents with  . Arthritis    bilateral legs right worse then left   . Arm Pain    right arm for 3 weeks    Subjective:   Makayla Mason is a 54 y.o. female, presents to clinic with CC of the following:  HPI  Here for right knee pain and right elbow pain She has seen [facility] for right leg - notes she has arthritis  She complains of right elbow pain, she things its "bursitis" popping, aches all the time, onset 3-4 weeks ago, works as a Quarry manager, right handed a lot of repetitive and strenuous activity with arms, no specific injury, gradual onset of sx, aching constantly   Tried icing - nothing else  Since Sept was waiting for referral to ortho for right knee pain, right hip pain - various other aches and pains  Urinary frequency x 1 week, no dysuria, hematuria, abd pain, flank pain, N, V, wants urine checked and A1C, however A1C just done 5.8 in Sept   Patient Active Problem List   Diagnosis Date Noted  . Sprain of ulnar collateral ligament of metacarpophalangeal (MCP) joint of right thumb 06/08/2020  . Cervical radicular pain 07/15/2018  . Overweight (BMI 25.0-29.9) 01/29/2018  . Right hip pain 01/29/2018  . Insulin resistance 02/28/2017  . Low grade squamous intraepith lesion on cytologic smear cervix (lgsil) 02/28/2017  . GERD without esophagitis 03/15/2016  . Left ankle pain 03/15/2016  . Allergic rhinitis, seasonal 05/21/2015  . Scoliosis 05/21/2015  . Arthralgia of temporomandibular joint 05/21/2015      Current Outpatient Medications:  .  montelukast (SINGULAIR) 10 MG tablet, Take 1 tablet (10 mg total) by mouth at bedtime., Disp: 90 tablet, Rfl: 1 .  omeprazole (PRILOSEC) 40 MG capsule, Take 1 capsule (40 mg total) by mouth daily., Disp: 90 capsule, Rfl: 1 .  triamcinolone (NASACORT ALLERGY 24HR) 55 MCG/ACT AERO nasal inhaler,  Place 1 spray into the nose daily., Disp: 3 each, Rfl: 1   Allergies  Allergen Reactions  . Codeine Nausea And Vomiting     Social History   Tobacco Use  . Smoking status: Never Smoker  . Smokeless tobacco: Never Used  Vaping Use  . Vaping Use: Never used  Substance Use Topics  . Alcohol use: Yes    Alcohol/week: 1.0 standard drink    Types: 1 Glasses of wine per week  . Drug use: No      Chart Review Today: I personally reviewed active problem list, medication list, allergies, family history, social history, health maintenance, notes from last encounter, lab results, imaging with the patient/caregiver today.   Review of Systems 10 Systems reviewed and are negative for acute change except as noted in the HPI.     Objective:   Vitals:   09/10/20 1027  BP: 120/72  Pulse: 73  Resp: 16  Temp: 98 F (36.7 C)  TempSrc: Oral  SpO2: 96%  Weight: 141 lb 3.2 oz (64 kg)  Height: 5\' 1"  (1.549 m)    Body mass index is 26.68 kg/m.  Physical Exam Vitals and nursing note reviewed.  Constitutional:      General: She is not in acute distress.    Appearance: Normal appearance. She is normal weight. She is not ill-appearing, toxic-appearing or diaphoretic.  HENT:     Head:  Normocephalic and atraumatic.  Cardiovascular:     Rate and Rhythm: Normal rate.  Abdominal:     General: There is no distension.     Palpations: Abdomen is soft.     Tenderness: There is no abdominal tenderness. There is no right CVA tenderness, left CVA tenderness, guarding or rebound.  Musculoskeletal:     Right elbow: No swelling, deformity, effusion or lacerations. Normal range of motion. No tenderness.     Comments: Right elbow - no bony or generalized ttp, no erythema, normal ROM, no crepitus  Skin:    General: Skin is warm and dry.     Coloration: Skin is not jaundiced or pale.     Findings: No bruising or erythema.  Neurological:     Mental Status: She is alert. Mental status is at  baseline.     Gait: Gait normal.  Psychiatric:        Mood and Affect: Mood normal.        Behavior: Behavior normal.      Results for orders placed or performed in visit on 07/21/20  CBC  Result Value Ref Range   WBC 9.4 3.8 - 10.8 Thousand/uL   RBC 4.77 3.80 - 5.10 Million/uL   Hemoglobin 13.5 11.7 - 15.5 g/dL   HCT 42.4 35 - 45 %   MCV 88.9 80.0 - 100.0 fL   MCH 28.3 27.0 - 33.0 pg   MCHC 31.8 (L) 32.0 - 36.0 g/dL   RDW 13.5 11.0 - 15.0 %   Platelets 397 140 - 400 Thousand/uL   MPV 10.1 7.5 - 12.5 fL  POCT HgB A1C  Result Value Ref Range   Hemoglobin A1C 5.8 (A) 4.0 - 5.6 %   HbA1c POC (<> result, manual entry)     HbA1c, POC (prediabetic range)     HbA1c, POC (controlled diabetic range)         Assessment & Plan:      ICD-10-CM   1. Right elbow pain  M25.521 meloxicam (MOBIC) 15 MG tablet   onset gradual over few weeks, exam normal, no swelling or redness - see plan below - defer any imaging to specialists  2. Urinary frequency  R35.0 POCT urinalysis dipstick  3. Chronic pain of right knee  M25.561    G89.29    previously referred - f/up on ortho referral  4. Right hip pain  M25.551    previously referred - f/up on ortho referral     Tx conservative, RICE, elbow support brace or sleeve, offered work note, she declined mobic once daily, tylenol prn  Urinary frequency - a week, screen urine - treat pending results May add on testing if dip positive  Delsa Grana, PA-C 09/10/20 10:48 AM

## 2020-09-13 ENCOUNTER — Ambulatory Visit: Payer: Self-pay

## 2020-09-13 NOTE — Telephone Encounter (Signed)
   PF 1  Makayla Mason. [place] Female, 54 y.o., 1965-12-20 MRN:  [id number] Phone:  [phone] [name]) PCP:  Steele Sizer, MD Coverage:  [employer] Next Appt With Internal Medicine 01/24/2021 at 10:40 AM Message from Loma Boston sent at 09/13/2020 10:21 AM EST  Summary: wants nurse cb before 11   Pt going to get booster shot around 11 today wanted to ask a nurse if thought ok [phone]         Call History   Type Contact Phone User  09/13/2020 10:19 AM EST Phone (Incoming) Latitia, [place] (Self) [phone] Lemmie Evens) Loma Boston  09/13/2020 10:17 AM EST Phone (Incoming) Verner Chol M   Pt. Checking to see of she can take her booster for COVID 19. Has been 28 days since last vaccine. Pt. Verbalizes understanding.

## 2020-11-30 ENCOUNTER — Ambulatory Visit: Payer: 59 | Admitting: Family Medicine

## 2021-01-24 ENCOUNTER — Ambulatory Visit: Payer: 59 | Admitting: Family Medicine

## 2021-01-31 DIAGNOSIS — H40013 Open angle with borderline findings, low risk, bilateral: Secondary | ICD-10-CM | POA: Diagnosis not present

## 2021-01-31 DIAGNOSIS — H1045 Other chronic allergic conjunctivitis: Secondary | ICD-10-CM | POA: Diagnosis not present

## 2021-01-31 DIAGNOSIS — H5203 Hypermetropia, bilateral: Secondary | ICD-10-CM | POA: Diagnosis not present

## 2021-02-02 ENCOUNTER — Ambulatory Visit: Payer: 59 | Admitting: Family Medicine

## 2021-03-04 NOTE — Progress Notes (Signed)
Name: Makayla Mason   MRN: [id number]    DOB: 1966-10-04   Date:03/07/2021       Progress Note  Subjective  Chief Complaint  Follow Up  HPI  Insulin Resistance: She denies polyphagia, polyuria or polyphagia.Last A1C was  5.8 %, she has been drinking more water, she is down to one can of Dr. Malachi Bonds per day and drinking diet instead of regular . She still likes desserts but cut down on frequency   Dyslipidemia:   The 10-year ASCVD risk score Mikey Bussing DC Jr., et al., 2013) is: 3.3%   Values used to calculate the score:     Age: 55 years     Sex: Female     Is Non-Hispanic African American: Yes     Diabetic: No     Tobacco smoker: No     Systolic Blood Pressure: 166 mmHg     Is BP treated: No     HDL Cholesterol: 52 mg/dL     Total Cholesterol: 187 mg/dL  GERD: she takes Omeprazole before dinner , and symptoms are controlled, no heartburn or regurgitation. Doing well  Lower abdominal pain: she states she has some lower abdominal pain, described as dull aching when she first gets up in am's, she states it resolves when she stands up. No hematuria, urine odor, no change in activity. She denies fever, chills, change in appetite or bowel movements . She states her mother has the same symptoms.   Perennial AR:she started allergy shots Fall 2019 with ENT, sees Dr. Ladene Artist , she took a pause on allergy shots since symptoms under control, but is going back to see him in June. She is doing well off the shots She denies nasal congestion, rhinorrhea,  wheezing or SOB  TMJ: pain when she yawns and sometimes when she gets up, she is avoiding chewing gum, and is doing well. Unchanged   OA right knee and hip: seen by Dr. Rudene Christians Dorise Hiss PA in the past and had X-rays, she states pain has been getting progressively worse, she went to [facility] with severe pain back in 06/2020 , negative for DVT, CT angio negative, elevated WBC. Her insurance requires a [facility] provider and we will send her to [facility]  for further evaluation   Neck radiculitis: she went to Dermatologist with burning sensation on right scapular area when showering, she was told not from her skin but from a pinched nerve, she changed her posture while looking at the computer and symptoms have improved. Mild pain with rom of neck. Denies weakness , no tingling on arm   Patient Active Problem List   Diagnosis Date Noted  . Sprain of ulnar collateral ligament of metacarpophalangeal (MCP) joint of right thumb 06/08/2020  . Cervical radicular pain 07/15/2018  . Overweight (BMI 25.0-29.9) 01/29/2018  . Right hip pain 01/29/2018  . Insulin resistance 02/28/2017  . Low grade squamous intraepith lesion on cytologic smear cervix (lgsil) 02/28/2017  . GERD without esophagitis 03/15/2016  . Left ankle pain 03/15/2016  . Allergic rhinitis, seasonal 05/21/2015  . Scoliosis 05/21/2015  . 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 COLONOSCOPY WITH PROPOFOL N/A 02/15/2018   Procedure: COLONOSCOPY WITH PROPOFOL;  Surgeon: Jonathon Bellows, MD;  [facility] END[facility]COPY;  Service: Gastroenterology;  Laterality: N/A;    Family History  Problem Relation Age of Onset  . Breast cancer Cousin   . Diabetes Mother   . Heart murmur  Mother   . Diabetes Father   . Hypertension Father   . Asthma Daughter   . Anemia Maternal Aunt   . Stroke Maternal Aunt   . Kidney disease Maternal Grandmother   . Hypertension Maternal Grandmother   . Heart failure Maternal Grandmother   . Heart attack Maternal Grandfather     Social History   Tobacco Use  . Smoking status: Never Smoker  . Smokeless tobacco: Never Used  Substance Use Topics  . Alcohol use: Yes    Alcohol/week: 1.0 standard drink    Types: 1 Glasses of wine per week     Current Outpatient Medications:  .  montelukast (SINGULAIR) 10 MG tablet, TAKE 1 TABLET BY MOUTH AT BEDTIME., Disp: 90 tablet, Rfl: 1 .  omeprazole  (PRILOSEC) 40 MG capsule, TAKE 1 CAPSULE BY MOUTH DAILY., Disp: 90 capsule, Rfl: 1 .  triamcinolone (NASACORT ALLERGY 24HR) 55 MCG/ACT AERO nasal inhaler, Place 1 spray into the nose daily., Disp: 3 each, Rfl: 1 .  meloxicam (MOBIC) 15 MG tablet, Take 1 tablet (15 mg total) by mouth daily. (Patient not taking: Reported on 03/07/2021), Disp: 30 tablet, Rfl: 1  Allergies  Allergen Reactions  . Codeine Nausea And Vomiting    I personally reviewed active problem list, medication list, allergies, family history, social history, health maintenance with the patient/caregiver today.   ROS  Constitutional: Negative for fever or weight change.  Respiratory: Negative for cough and shortness of breath.   Cardiovascular: Negative for chest pain or palpitations.  Gastrointestinal: Negative for abdominal pain, no bowel changes.  Musculoskeletal: Negative for gait problem or joint swelling.  Skin: Negative for rash.  Neurological: Negative for dizziness or headache.  No other specific complaints in a complete review of systems (except as listed in HPI above).  Objective  Vitals:   03/07/21 1119  BP: 122/84  Pulse: 82  Resp: 16  Temp: 98.1 F (36.7 C)  TempSrc: Oral  SpO2: 99%  Weight: 149 lb (67.6 kg)  Height: 5\' 1"  (1.549 m)    Body mass index is 28.15 kg/m.  Physical Exam  Constitutional: Patient appears well-developed and well-nourished.  No distress.  HEENT: head atraumatic, normocephalic, pupils equal and reactive to light, ears normal exam, neck supple, throat within normal limits Cardiovascular: Normal rate, regular rhythm and normal heart sounds.  No murmur heard. No BLE edema. Pulmonary/Chest: Effort normal and breath sounds normal. No respiratory distress. Abdominal: Soft.  There is no tenderness. Pain on outer right hip with rom, but not in her groin  Muscular Skeletal: normal grip and sensation of right trapezium /scapular area, some with extension of neck Psychiatric:  Patient has a normal mood and affect. behavior is normal. Judgment and thought content normal.  PHQ2/9: Depression screen [facility] 2/9 03/07/2021 09/10/2020 07/21/2020 06/08/2020 05/04/2020  Decreased Interest 0 0 0 0 0  Down, Depressed, Hopeless 0 0 0 0 0  PHQ - 2 Score 0 0 0 0 0  Altered sleeping - - - 0 0  Tired, decreased energy - - - 0 0  Change in appetite - - - 0 0  Feeling bad or failure about yourself  - - - 0 0  Trouble concentrating - - - 0 0  Moving slowly or fidgety/restless - - - 0 0  Suicidal thoughts - - - 0 0  PHQ-9 Score - - - 0 0  Difficult doing work/chores - - - Not difficult at all Not difficult at all  Some recent data  might be hidden    phq 9 is negative   Fall Risk: Fall Risk  03/07/2021 09/10/2020 07/21/2020 06/08/2020 05/04/2020  Falls in the past year? 0 0 0 0 0  Number falls in past yr: 0 0 0 0 0  Injury with Fall? 0 0 0 0 0  Comment - - - - -  Follow up - Falls evaluation completed - - -     Functional Status Survey: Is the patient deaf or have difficulty hearing?: No Does the patient have difficulty seeing, even when wearing glasses/contacts?: No Does the patient have difficulty concentrating, remembering, or making decisions?: No Does the patient have difficulty walking or climbing stairs?: Yes Does the patient have difficulty dressing or bathing?: No Does the patient have difficulty doing errands alone such as visiting a doctor's office or shopping?: No    Assessment & Plan  1. Prediabetes   2. Perennial allergic rhinitis with seasonal variation  - montelukast (SINGULAIR) 10 MG tablet; TAKE 1 TABLET BY MOUTH AT BEDTIME.  Dispense: 90 tablet; Refill: 1  3. GERD without esophagitis  - omeprazole (PRILOSEC) 40 MG capsule; TAKE 1 CAPSULE BY MOUTH DAILY.  Dispense: 90 capsule; Refill: 1  4. Need for shingles vaccine  - Varicella-zoster vaccine IM (Shingrix)  5. Primary osteoarthritis of right hip  - Ambulatory referral to Orthopedic Surgery  6.  Radiculitis, cervical  Doing better  7. Lower abdominal pain

## 2021-03-07 ENCOUNTER — Encounter: Payer: Self-pay | Admitting: Family Medicine

## 2021-03-07 ENCOUNTER — Other Ambulatory Visit: Payer: Self-pay

## 2021-03-07 ENCOUNTER — Ambulatory Visit: Payer: 59 | Admitting: Family Medicine

## 2021-03-07 ENCOUNTER — Ambulatory Visit (INDEPENDENT_AMBULATORY_CARE_PROVIDER_SITE_OTHER): Payer: 59 | Admitting: Family Medicine

## 2021-03-07 VITALS — BP 122/84 | HR 82 | Temp 98.1°F | Resp 16 | Ht 61.0 in | Wt 149.0 lb

## 2021-03-07 DIAGNOSIS — J3089 Other allergic rhinitis: Secondary | ICD-10-CM | POA: Diagnosis not present

## 2021-03-07 DIAGNOSIS — J302 Other seasonal allergic rhinitis: Secondary | ICD-10-CM

## 2021-03-07 DIAGNOSIS — K219 Gastro-esophageal reflux disease without esophagitis: Secondary | ICD-10-CM

## 2021-03-07 DIAGNOSIS — R1030 Lower abdominal pain, unspecified: Secondary | ICD-10-CM | POA: Diagnosis not present

## 2021-03-07 DIAGNOSIS — M1611 Unilateral primary osteoarthritis, right hip: Secondary | ICD-10-CM

## 2021-03-07 DIAGNOSIS — Z23 Encounter for immunization: Secondary | ICD-10-CM | POA: Diagnosis not present

## 2021-03-07 DIAGNOSIS — M5412 Radiculopathy, cervical region: Secondary | ICD-10-CM | POA: Diagnosis not present

## 2021-03-07 DIAGNOSIS — R7303 Prediabetes: Secondary | ICD-10-CM | POA: Diagnosis not present

## 2021-03-07 MED ORDER — OMEPRAZOLE 40 MG PO CPDR
DELAYED_RELEASE_CAPSULE | Freq: Every day | ORAL | 1 refills | Status: DC
  Filled 2021-03-07: qty 90, 90d supply, fill #0

## 2021-03-07 MED ORDER — MONTELUKAST SODIUM 10 MG PO TABS
ORAL_TABLET | Freq: Every day | ORAL | 1 refills | Status: DC
  Filled 2021-03-07: qty 90, 90d supply, fill #0

## 2021-03-07 NOTE — Patient Instructions (Signed)
Mediterranean Diet A Mediterranean diet refers to food and lifestyle choices that are based on the traditions of countries located on the [place]. This way of eating has been shown to help prevent certain conditions and improve outcomes for people who have chronic diseases, like kidney disease and heart disease. What are tips for following this plan? Lifestyle  Cook and eat meals together with your family, when possible.  Drink enough fluid to keep your urine clear or pale yellow.  Be physically active every day. This includes: ? Aerobic exercise like running or swimming. ? Leisure activities like gardening, walking, or housework.  Get 7-8 hours of sleep each night.  If recommended by your health care provider, drink red wine in moderation. This means 1 glass a day for nonpregnant women and 2 glasses a day for men. A glass of wine equals 5 oz (150 mL). Reading food labels  Check the serving size of packaged foods. For foods such as rice and pasta, the serving size refers to the amount of cooked product, not dry.  Check the total fat in packaged foods. Avoid foods that have saturated fat or trans fats.  Check the ingredients list for added sugars, such as corn syrup.   Shopping  At the grocery store, buy most of your food from the areas near the walls of the store. This includes: ? Fresh fruits and vegetables (produce). ? Grains, beans, nuts, and seeds. Some of these may be available in unpackaged forms or large amounts (in bulk). ? Fresh seafood. ? Poultry and eggs. ? Low-fat dairy products.  Buy whole ingredients instead of prepackaged foods.  Buy fresh fruits and vegetables in-season from local farmers markets.  Buy frozen fruits and vegetables in resealable bags.  If you do not have access to quality fresh seafood, buy precooked frozen shrimp or canned fish, such as tuna, salmon, or sardines.  Buy small amounts of raw or cooked vegetables, salads, or olives from  the deli or salad bar at your store.  Stock your pantry so you always have certain foods on hand, such as olive oil, canned tuna, canned tomatoes, rice, pasta, and beans. Cooking  Cook foods with extra-virgin olive oil instead of using butter or other vegetable oils.  Have meat as a side dish, and have vegetables or grains as your main dish. This means having meat in small portions or adding small amounts of meat to foods like pasta or stew.  Use beans or vegetables instead of meat in common dishes like chili or lasagna.  Experiment with different cooking methods. Try roasting or broiling vegetables instead of steaming or sauteing them.  Add frozen vegetables to soups, stews, pasta, or rice.  Add nuts or seeds for added healthy fat at each meal. You can add these to yogurt, salads, or vegetable dishes.  Marinate fish or vegetables using olive oil, lemon juice, garlic, and fresh herbs. Meal planning  Plan to eat 1 vegetarian meal one day each week. Try to work up to 2 vegetarian meals, if possible.  Eat seafood 2 or more times a week.  Have healthy snacks readily available, such as: ? Vegetable sticks with hummus. ? Greek yogurt. ? Fruit and nut trail mix.  Eat balanced meals throughout the week. This includes: ? Fruit: 2-3 servings a day ? Vegetables: 4-5 servings a day ? Low-fat dairy: 2 servings a day ? Fish, poultry, or lean meat: 1 serving a day ? Beans and legumes: 2 or more servings a week ?   Nuts and seeds: 1-2 servings a day ? Whole grains: 6-8 servings a day ? Extra-virgin olive oil: 3-4 servings a day  Limit red meat and sweets to only a few servings a month   What are my food choices?  Mediterranean diet ? Recommended  Grains: Whole-grain pasta. Brown rice. Bulgar wheat. Polenta. Couscous. Whole-wheat bread. Oatmeal. Quinoa.  Vegetables: Artichokes. Beets. Broccoli. Cabbage. Carrots. Eggplant. Green beans. Chard. Kale. Spinach. Onions. Leeks. Peas. Squash.  Tomatoes. Peppers. Radishes.  Fruits: Apples. Apricots. Avocado. Berries. Bananas. Cherries. Dates. Figs. Grapes. Lemons. Melon. Oranges. Peaches. Plums. Pomegranate.  Meats and other protein foods: Beans. Almonds. Sunflower seeds. Pine nuts. Peanuts. Cod. Salmon. Scallops. Shrimp. Tuna. Tilapia. Clams. Oysters. Eggs.  Dairy: Low-fat milk. Cheese. Greek yogurt.  Beverages: Water. Red wine. Herbal tea.  Fats and oils: Extra virgin olive oil. Avocado oil. Grape seed oil.  Sweets and desserts: Greek yogurt with honey. Baked apples. Poached pears. Trail mix.  Seasoning and other foods: Basil. Cilantro. Coriander. Cumin. Mint. Parsley. Sage. Rosemary. Tarragon. Garlic. Oregano. Thyme. Pepper. Balsalmic vinegar. Tahini. Hummus. Tomato sauce. Olives. Mushrooms. ? Limit these  Grains: Prepackaged pasta or rice dishes. Prepackaged cereal with added sugar.  Vegetables: Deep fried potatoes (french fries).  Fruits: Fruit canned in syrup.  Meats and other protein foods: Beef. Pork. Lamb. Poultry with skin. Hot dogs. Bacon.  Dairy: Ice cream. Sour cream. Whole milk.  Beverages: Juice. Sugar-sweetened soft drinks. Beer. Liquor and spirits.  Fats and oils: Butter. Canola oil. Vegetable oil. Beef fat (tallow). Lard.  Sweets and desserts: Cookies. Cakes. Pies. Candy.  Seasoning and other foods: Mayonnaise. Premade sauces and marinades. The items listed may not be a complete list. Talk with your dietitian about what dietary choices are right for you. Summary  The Mediterranean diet includes both food and lifestyle choices.  Eat a variety of fresh fruits and vegetables, beans, nuts, seeds, and whole grains.  Limit the amount of red meat and sweets that you eat.  Talk with your health care provider about whether it is safe for you to drink red wine in moderation. This means 1 glass a day for nonpregnant women and 2 glasses a day for men. A glass of wine equals 5 oz (150 mL). This information  is not intended to replace advice given to you by your health care provider. Make sure you discuss any questions you have with your health care provider. Document Revised: 06/22/2016 Document Reviewed: 06/15/2016 Elsevier Patient Education  2020 Elsevier Inc.  

## 2021-03-21 ENCOUNTER — Other Ambulatory Visit: Payer: Self-pay

## 2021-03-29 ENCOUNTER — Encounter: Payer: Self-pay | Admitting: Family Medicine

## 2021-04-06 ENCOUNTER — Encounter: Payer: Self-pay | Admitting: Obstetrics and Gynecology

## 2021-04-06 ENCOUNTER — Other Ambulatory Visit: Payer: Self-pay

## 2021-04-06 ENCOUNTER — Ambulatory Visit (INDEPENDENT_AMBULATORY_CARE_PROVIDER_SITE_OTHER): Payer: 59 | Admitting: Obstetrics and Gynecology

## 2021-04-06 VITALS — BP 108/71 | HR 99 | Ht 61.0 in | Wt 148.9 lb

## 2021-04-06 DIAGNOSIS — R1030 Lower abdominal pain, unspecified: Secondary | ICD-10-CM

## 2021-04-06 DIAGNOSIS — R110 Nausea: Secondary | ICD-10-CM | POA: Diagnosis not present

## 2021-04-06 NOTE — Progress Notes (Signed)
Pt c/o cramping in the abd area and nausea.

## 2021-04-06 NOTE — Progress Notes (Signed)
    GYNECOLOGY PROGRESS NOTE  Subjective:    Patient ID: Makayla Mason, female    DOB: [birth year], 55 y.o.   MRN: [id number]  HPI  Patient is a 55 y.o. G7P2002 female who presents for complaints of lower abdominal pain for the past several weeks.  She notes that the pain feels like cramping with associated nausea.  Symptoms occur mostly in the morning after waking up and getting up out of the bed.  She denies any issues with constipation or diarrhea.  Denies any recent changes in her diet.  Also denies urinary discomfort or hematuria.  States that she has not done any recent heavy lifting.  Notes that her daughter suggested that she may be having sympathy pains as she is currently pregnant and near her due date.   The following portions of the patient's history were reviewed and updated as appropriate: allergies, current medications, past family history, past medical history, past social history, past surgical history and problem list.  Review of Systems Pertinent items noted in HPI and remainder of comprehensive ROS otherwise negative.   Objective:   Blood pressure 108/71, pulse 99, height 5\' 1"  (1.549 m), weight 148 lb 14.4 oz (67.5 kg). General appearance: alert and no distress Abdomen: soft, non-tender; bowel sounds normal; no masses,  no organomegaly Pelvic:external genitalia normal, rectovaginal septum normal.  Vagina without discharge.  Cervix normal appearing, no lesions and no motion tenderness.  Uterus mobile, nontender, normal shape and size.  Adnexae non-palpable, nontender bilaterally.    Assessment:   1. Lower abdominal pain   2. Nausea    Plan:    -No obvious cause of patient's pain.  Advised to keep track of symptoms, to assess if pain is still present after getting up to void in the morning or showering.  Advised that she can utilize warm compresses in the morning before getting up.  Also discussed the sympathy pains could be a possibility as she and her daughter spend  a lot of time together and she has lots of excitement and nervousness as the due date approaches..  To follow-up if symptoms persist or worsen and can pursue further evaluation at that time.   Rubie Maid, MD [facility]

## 2021-04-12 ENCOUNTER — Other Ambulatory Visit: Payer: Self-pay | Admitting: Family Medicine

## 2021-04-12 DIAGNOSIS — Z1231 Encounter for screening mammogram for malignant neoplasm of breast: Secondary | ICD-10-CM

## 2021-05-10 ENCOUNTER — Ambulatory Visit (INDEPENDENT_AMBULATORY_CARE_PROVIDER_SITE_OTHER): Payer: BC Managed Care – PPO | Admitting: Emergency Medicine

## 2021-05-10 ENCOUNTER — Other Ambulatory Visit: Payer: Self-pay

## 2021-05-10 DIAGNOSIS — Z23 Encounter for immunization: Secondary | ICD-10-CM | POA: Diagnosis not present

## 2021-05-11 ENCOUNTER — Ambulatory Visit
Admission: RE | Admit: 2021-05-11 | Discharge: 2021-05-11 | Disposition: A | Discharge status: Home / Self Care | Payer: BC Managed Care – PPO | Source: Ambulatory Visit | Attending: Family Medicine | Admitting: Family Medicine

## 2021-05-11 DIAGNOSIS — Z1231 Encounter for screening mammogram for malignant neoplasm of breast: Secondary | ICD-10-CM | POA: Insufficient documentation

## 2021-05-26 ENCOUNTER — Encounter: Payer: Self-pay | Admitting: Family Medicine

## 2021-06-10 ENCOUNTER — Encounter: Payer: 59 | Admitting: Obstetrics and Gynecology

## 2021-06-20 ENCOUNTER — Encounter: Payer: Self-pay | Admitting: Family Medicine

## 2021-08-10 ENCOUNTER — Ambulatory Visit (INDEPENDENT_AMBULATORY_CARE_PROVIDER_SITE_OTHER): Payer: BC Managed Care – PPO | Admitting: Family Medicine

## 2021-08-10 ENCOUNTER — Encounter: Payer: Self-pay | Admitting: Family Medicine

## 2021-08-10 ENCOUNTER — Other Ambulatory Visit: Payer: Self-pay

## 2021-08-10 VITALS — BP 114/70 | HR 81 | Temp 98.3°F | Resp 16 | Ht 61.0 in | Wt 148.0 lb

## 2021-08-10 DIAGNOSIS — Z13220 Encounter for screening for lipoid disorders: Secondary | ICD-10-CM | POA: Diagnosis not present

## 2021-08-10 DIAGNOSIS — Z0000 Encounter for general adult medical examination without abnormal findings: Secondary | ICD-10-CM

## 2021-08-10 DIAGNOSIS — Z131 Encounter for screening for diabetes mellitus: Secondary | ICD-10-CM | POA: Diagnosis not present

## 2021-08-10 DIAGNOSIS — Z79899 Other long term (current) drug therapy: Secondary | ICD-10-CM

## 2021-08-10 DIAGNOSIS — Z111 Encounter for screening for respiratory tuberculosis: Secondary | ICD-10-CM

## 2021-08-10 DIAGNOSIS — Z23 Encounter for immunization: Secondary | ICD-10-CM | POA: Diagnosis not present

## 2021-08-10 DIAGNOSIS — Z130 Encounter for screening for diseases of the blood and blood-forming organs and certain disorders involving the immune mechanism: Secondary | ICD-10-CM

## 2021-08-10 NOTE — Progress Notes (Signed)
Name: Makayla Mason   MRN: [id number]    DOB: 08/25/1966   Date:08/10/2021       Progress Note  Subjective  Chief Complaint  Chief Complaint  Patient presents with   Annual Exam    HPI  Patient presents for annual CPE.  Diet: packs her lunch and cooks at home  Exercise: continue regular physical activity    [place] Office Visit from 09/10/2020 in [facility]  AUDIT-C Score 0      Depression: Phq 9 is  negative Depression screen [facility] 2/9 08/10/2021 03/07/2021 09/10/2020 07/21/2020 06/08/2020  Decreased Interest 0 0 0 0 0  Down, Depressed, Hopeless 0 0 0 0 0  PHQ - 2 Score 0 0 0 0 0  Altered sleeping - - - - 0  Tired, decreased energy - - - - 0  Change in appetite - - - - 0  Feeling bad or failure about yourself  - - - - 0  Trouble concentrating - - - - 0  Moving slowly or fidgety/restless - - - - 0  Suicidal thoughts - - - - 0  PHQ-9 Score - - - - 0  Difficult doing work/chores - - - - Not difficult at all  Some recent data might be hidden   Hypertension: BP Readings from Last 3 Encounters:  08/10/21 114/70  04/06/21 108/71  03/07/21 122/84   Obesity: Wt Readings from Last 3 Encounters:  08/10/21 148 lb (67.1 kg)  04/06/21 148 lb 14.4 oz (67.5 kg)  03/07/21 149 lb (67.6 kg)   BMI Readings from Last 3 Encounters:  08/10/21 27.96 kg/m  04/06/21 28.13 kg/m  03/07/21 28.15 kg/m     Vaccines:   Shingrix: up to date  Pneumonia: educated and discussed with patient. Flu: educated and discussed with patient.  Hep C Screening: 03/20/16 STD testing and prevention (HIV/chl/gon/syphilis): 02/01/17 Intimate partner violence: negative Sexual History : not sexually active Menstrual History/LMP/Abnormal Bleeding: discussed post-menopausal bleeding  Incontinence Symptoms: no problems.   Breast cancer:  - Last Mammogram: 05/11/21 - BRCA gene screening: N/A  Osteoporosis: Discussed high calcium and vitamin D supplementation, weight bearing  exercises  Cervical cancer screening: 06/08/20  Skin cancer: Discussed monitoring for atypical lesions  Colorectal cancer: 02/15/18   Lung cancer: Low Dose CT Chest recommended if Age 9-80 years, 20 pack-year currently smoking OR have quit w/in 15years. Patient does not qualify.   ECG: 06/09/20  Advanced Care Planning: A voluntary discussion about advance care planning including the explanation and discussion of advance directives.  Discussed health care proxy and Living will, and the patient was able to identify a health care proxy as her daugthers .   Lipids: Lab Results  Component Value Date   CHOL 187 06/08/2020   CHOL 181 12/04/2018   CHOL 164 01/29/2018   Lab Results  Component Value Date   HDL 52 06/08/2020   HDL 53 12/04/2018   HDL 51 01/29/2018   Lab Results  Component Value Date   LDLCALC 114 (H) 06/08/2020   LDLCALC 107 (H) 12/04/2018   LDLCALC 88 01/29/2018   Lab Results  Component Value Date   TRIG 115 06/08/2020   TRIG 114 12/04/2018   TRIG 145 01/29/2018   Lab Results  Component Value Date   CHOLHDL 3.6 06/08/2020   CHOLHDL 3.4 12/04/2018   CHOLHDL 3.2 01/29/2018   No results found for: LDLDIRECT  Glucose: Glucose, Bld  Date Value Ref Range Status  06/06/2020 97 70 -  99 mg/dL Final    Comment:    Glucose reference range applies only to samples taken after fasting for at least 8 hours.  12/04/2018 82 65 - 99 mg/dL Final    Comment:    .            Fasting reference interval .   01/29/2018 75 65 - 99 mg/dL Final    Comment:    .            Fasting reference interval .     Patient Active Problem List   Diagnosis Date Noted   Sprain of ulnar collateral ligament of metacarpophalangeal (MCP) joint of right thumb 06/08/2020   Cervical radicular pain 07/15/2018   Overweight (BMI 25.0-29.9) 01/29/2018   Right hip pain 01/29/2018   Insulin resistance 02/28/2017   Low grade squamous intraepith lesion on cytologic smear cervix (lgsil) 02/28/2017    GERD without esophagitis 03/15/2016   Left ankle pain 03/15/2016   Allergic rhinitis, seasonal 05/21/2015   Scoliosis 05/21/2015   Arthralgia of temporomandibular joint 05/21/2015    Past Surgical History:  Procedure Laterality Date   COLONOSCOPY WITH PROPOFOL N/A 02/15/2018   Procedure: COLONOSCOPY WITH PROPOFOL;  Surgeon: Jonathon Bellows, MD;  Location: [facility] ENDOSCOPY;  Service: Gastroenterology;  Laterality: N/A;    Family History  Problem Relation Age of Onset   Breast cancer Cousin    Diabetes Mother    Heart murmur Mother    Diabetes Father    Hypertension Father    Asthma Daughter    Anemia Maternal Aunt    Stroke Maternal Aunt    Kidney disease Maternal Grandmother    Hypertension Maternal Grandmother    Heart failure Maternal Grandmother    Heart attack Maternal Grandfather     Social History   Socioeconomic History   Marital status: Divorced    Spouse name: Not on file   Number of children: 2   Years of education: Not on file   Highest education level: Bachelor's degree (e.g., BA, AB, BS)  Occupational History   Not on file  Tobacco Use   Smoking status: Never   Smokeless tobacco: Never  Vaping Use   Vaping Use: Never used  Substance and Sexual Activity   Alcohol use: Yes    Alcohol/week: 1.0 standard drink    Types: 1 Glasses of wine per week   Drug use: No   Sexual activity: Not Currently    Birth control/protection: Post-menopausal  Other Topics Concern   Not on file  Social History Narrative   Lives in [place], grown daughters and grandson are at home   Social Determinants of Health   Financial Resource Strain: Medium Risk   Difficulty of Paying Living Expenses: Somewhat hard  Food Insecurity: Food Insecurity Present   Worried About Charity fundraiser in the Last Year: Sometimes true   Arboriculturist in the Last Year: Often true  Transportation Needs: No Transportation Needs   Lack of Transportation (Medical): No   Lack of  Transportation (Non-Medical): No  Physical Activity: Sufficiently Active   Days of Exercise per Week: 5 days   Minutes of Exercise per Session: 60 min  Stress: Stress Concern Present   Feeling of Stress : To some extent  Social Connections: Moderately Integrated   Frequency of Communication with Friends and Family: Three times a week   Frequency of Social Gatherings with Friends and Family: Once a week   Attends Religious Services: More than 4 times  per year   Active Member of Clubs or Organizations: Yes   Attends Archivist Meetings: 1 to 4 times per year   Marital Status: Divorced  Human resources officer Violence: Not At Risk   Fear of Current or Ex-Partner: No   Emotionally Abused: No   Physically Abused: No   Sexually Abused: No     Current Outpatient Medications:    montelukast (SINGULAIR) 10 MG tablet, TAKE 1 TABLET BY MOUTH AT BEDTIME., Disp: 90 tablet, Rfl: 1   omeprazole (PRILOSEC) 40 MG capsule, TAKE 1 CAPSULE BY MOUTH DAILY., Disp: 90 capsule, Rfl: 1   triamcinolone (NASACORT ALLERGY 24HR) 55 MCG/ACT AERO nasal inhaler, Place 1 spray into the nose daily., Disp: 3 each, Rfl: 1  Allergies  Allergen Reactions   Codeine Nausea And Vomiting     ROS  Constitutional: Negative for fever or weight change.  Respiratory: Negative for cough and shortness of breath.   Cardiovascular: Negative for chest pain or palpitations.  Gastrointestinal: Negative for abdominal pain, no bowel changes.  Musculoskeletal: Negative for gait problem or joint swelling.  Skin: Negative for rash.  Neurological: Negative for dizziness or headache.  No other specific complaints in a complete review of systems (except as listed in HPI above).   Objective  Vitals:   08/10/21 0959  BP: 114/70  Pulse: 81  Resp: 16  Temp: 98.3 F (36.8 C)  SpO2: 99%  Weight: 148 lb (67.1 kg)  Height: 5' 1"  (1.549 m)    Body mass index is 27.96 kg/m.  Physical Exam  Constitutional: Patient appears  well-developed and well-nourished. No distress.  HENT: Head: Normocephalic and atraumatic. Ears: B TMs ok, no erythema or effusion; Nose: Nose normal. Mouth/Throat: not done  Eyes: Conjunctivae and EOM are normal. Pupils are equal, round, and reactive to light. No scleral icterus.  Neck: Normal range of motion. Neck supple. No JVD present. No thyromegaly present.  Cardiovascular: Normal rate, regular rhythm and normal heart sounds.  No murmur heard. No BLE edema. Pulmonary/Chest: Effort normal and breath sounds normal. No respiratory distress. Abdominal: Soft. Bowel sounds are normal, no distension. There is no tenderness. no masses Breast: no lumps or masses, no nipple discharge or rashes FEMALE GENITALIA:  Not done  RECTAL: not done  Musculoskeletal: Normal range of motion, no joint effusions. No gross deformities Neurological: he is alert and oriented to person, place, and time. No cranial nerve deficit. Coordination, balance, strength, speech and gait are normal.  Skin: Skin is warm and dry. No rash noted. No erythema.  Psychiatric: Patient has a normal mood and affect. behavior is normal. Judgment and thought content normal.    Fall Risk: Fall Risk  08/10/2021 03/07/2021 09/10/2020 07/21/2020 06/08/2020  Falls in the past year? 0 0 0 0 0  Number falls in past yr: 0 0 0 0 0  Injury with Fall? 0 0 0 0 0  Comment - - - - -  Risk for fall due to : No Fall Risks - - - -  Follow up Falls prevention discussed - Falls evaluation completed - -     Functional Status Survey: Is the patient deaf or have difficulty hearing?: No Does the patient have difficulty seeing, even when wearing glasses/contacts?: No Does the patient have difficulty concentrating, remembering, or making decisions?: No Does the patient have difficulty walking or climbing stairs?: Yes Does the patient have difficulty dressing or bathing?: No Does the patient have difficulty doing errands alone such as visiting a doctor's  office or shopping?: No   Assessment & Plan  1. Well adult exam  - Flu Vaccine QUAD 56moIM (Fluarix, Fluzone & Alfiuria Quad PF) - Lipid panel - CBC with Differential/Platelet - COMPLETE METABOLIC PANEL WITH GFR - Hemoglobin A1c - QuantiFERON-TB Gold Plus  2. Need for immunization against influenza  - Flu Vaccine QUAD 659moM (Fluarix, Fluzone & Alfiuria Quad PF)  3. Screening-pulmonary TB  - QuantiFERON-TB Gold Plus  4. Screening for diabetes mellitus  - Hemoglobin A1c  5. Screening cholesterol level  - Lipid panel  6. Screening for deficiency anemia  - CBC with Differential/Platelet  7. Long-term use of high-risk medication  - COMPLETE METABOLIC PANEL WITH GFR   -USPSTF grade A and B recommendations reviewed with patient; age-appropriate recommendations, preventive care, screening tests, etc discussed and encouraged; healthy living encouraged; see AVS for patient education given to patient -Discussed importance of 150 minutes of physical activity weekly, eat two servings of fish weekly, eat one serving of tree nuts ( cashews, pistachios, pecans, almonds..)[place] other day, eat 6 servings of fruit/vegetables daily and drink plenty of water and avoid sweet beverages.

## 2021-08-10 NOTE — Patient Instructions (Signed)
Preventive Care 40-57 Years Old, Female Preventive care refers to lifestyle choices and visits with your health care provider that can promote health and wellness. This includes: A yearly physical exam. This is also called an annual wellness visit. Regular dental and eye exams. Immunizations. Screening for certain conditions. Healthy lifestyle choices, such as: Eating a healthy diet. Getting regular exercise. Not using drugs or products that contain nicotine and tobacco. Limiting alcohol use. What can I expect for my preventive care visit? Physical exam Your health care provider will check your: Height and weight. These may be used to calculate your BMI (body mass index). BMI is a measurement that tells if you are at a healthy weight. Heart rate and blood pressure. Body temperature. Skin for abnormal spots. Counseling Your health care provider may ask you questions about your: Past medical problems. Family's medical history. Alcohol, tobacco, and drug use. Emotional well-being. Home life and relationship well-being. Sexual activity. Diet, exercise, and sleep habits. Work and work environment. Access to firearms. Method of birth control. Menstrual cycle. Pregnancy history. What immunizations do I need? Vaccines are usually given at various ages, according to a schedule. Your health care provider will recommend vaccines for you based on your age, medical history, and lifestyle or other factors, such as travel or where you work. What tests do I need? Blood tests Lipid and cholesterol levels. These may be checked every 5 years, or more often if you are over 50 years old. Hepatitis C test. Hepatitis B test. Screening Lung cancer screening. You may have this screening every year starting at age 55 if you have a 30-pack-year history of smoking and currently smoke or have quit within the past 15 years. Colorectal cancer screening. All adults should have this screening starting at  age 50 and continuing until age 75. Your health care provider may recommend screening at age 45 if you are at increased risk. You will have tests every 1-10 years, depending on your results and the type of screening test. Diabetes screening. This is done by checking your blood sugar (glucose) after you have not eaten for a while (fasting). You may have this done every 1-3 years. Mammogram. This may be done every 1-2 years. Talk with your health care provider about when you should start having regular mammograms. This may depend on whether you have a family history of breast cancer. BRCA-related cancer screening. This may be done if you have a family history of breast, ovarian, tubal, or peritoneal cancers. Pelvic exam and Pap test. This may be done every 3 years starting at age 21. Starting at age 30, this may be done every 5 years if you have a Pap test in combination with an HPV test. Other tests STD (sexually transmitted disease) testing, if you are at risk. Bone density scan. This is done to screen for osteoporosis. You may have this scan if you are at high risk for osteoporosis. Talk with your health care provider about your test results, treatment options, and if necessary, the need for more tests. Follow these instructions at home: Eating and drinking  Eat a diet that includes fresh fruits and vegetables, whole grains, lean protein, and low-fat dairy products. Take vitamin and mineral supplements as recommended by your health care provider. Do not drink alcohol if: Your health care provider tells you not to drink. You are pregnant, may be pregnant, or are planning to become pregnant. If you drink alcohol: Limit how much you have to 0-1 drink a day. Be   aware of how much alcohol is in your drink. In the [place] one drink equals one 12 oz bottle of beer (355 mL), one 5 oz glass of wine (148 mL), or one 1 oz glass of hard liquor (44 mL). Lifestyle Take daily care of your teeth and  gums. Brush your teeth every morning and night with fluoride toothpaste. Floss one time each day. Stay active. Exercise for at least 30 minutes 5 or more days each week. Do not use any products that contain nicotine or tobacco, such as cigarettes, e-cigarettes, and chewing tobacco. If you need help quitting, ask your health care provider. Do not use drugs. If you are sexually active, practice safe sex. Use a condom or other form of protection to prevent STIs (sexually transmitted infections). If you do not wish to become pregnant, use a form of birth control. If you plan to become pregnant, see your health care provider for a prepregnancy visit. If told by your health care provider, take low-dose aspirin daily starting at age 63. Find healthy ways to cope with stress, such as: Meditation, yoga, or listening to music. Journaling. Talking to a trusted person. Spending time with friends and family. Safety Always wear your seat belt while driving or riding in a vehicle. Do not drive: If you have been drinking alcohol. Do not ride with someone who has been drinking. When you are tired or distracted. While texting. Wear a helmet and other protective equipment during sports activities. If you have firearms in your house, make sure you follow all gun safety procedures. What's next? Visit your health care provider once a year for an annual wellness visit. Ask your health care provider how often you should have your eyes and teeth checked. Stay up to date on all vaccines. This information is not intended to replace advice given to you by your health care provider. Make sure you discuss any questions you have with your health care provider. Document Revised: 12/31/2020 Document Reviewed: 07/04/2018 Elsevier Patient Education  2022 [employer].

## 2021-08-11 ENCOUNTER — Telehealth: Payer: Self-pay

## 2021-08-11 ENCOUNTER — Encounter: Payer: Self-pay | Admitting: Family Medicine

## 2021-08-11 NOTE — Telephone Encounter (Signed)
Copied from [place] [phone]. Topic: General - Other >> Aug 11, 2021  9:54 AM Makayla Mason A wrote: Reason for CRM: The patient would like to be contacted to go over their lab results when available   Please contact further when possible

## 2021-08-11 NOTE — Telephone Encounter (Signed)
Provider has not reviewed yet, will call as soon as provider release results.

## 2021-08-13 LAB — CBC WITH DIFFERENTIAL/PLATELET
Absolute Monocytes: 611 cells/uL (ref 200–950)
Basophils Absolute: 43 cells/uL (ref 0–200)
Basophils Relative: 0.5 %
Eosinophils Absolute: 413 cells/uL (ref 15–500)
Eosinophils Relative: 4.8 %
HCT: 40.1 % (ref 35.0–45.0)
Hemoglobin: 12.7 g/dL (ref 11.7–15.5)
Lymphs Abs: 3311 cells/uL (ref 850–3900)
MCH: 27.9 pg (ref 27.0–33.0)
MCHC: 31.7 g/dL — ABNORMAL LOW (ref 32.0–36.0)
MCV: 87.9 fL (ref 80.0–100.0)
MPV: 10.5 fL (ref 7.5–12.5)
Monocytes Relative: 7.1 %
Neutro Abs: 4223 cells/uL (ref 1500–7800)
Neutrophils Relative %: 49.1 %
Platelets: 364 10*3/uL (ref 140–400)
RBC: 4.56 10*6/uL (ref 3.80–5.10)
RDW: 13.7 % (ref 11.0–15.0)
Total Lymphocyte: 38.5 %
WBC: 8.6 10*3/uL (ref 3.8–10.8)

## 2021-08-13 LAB — COMPLETE METABOLIC PANEL WITH GFR
AG Ratio: 1.4 (calc) (ref 1.0–2.5)
ALT: 12 U/L (ref 6–29)
AST: 14 U/L (ref 10–35)
Albumin: 4 g/dL (ref 3.6–5.1)
Alkaline phosphatase (APISO): 81 U/L (ref 37–153)
BUN: 12 mg/dL (ref 7–25)
CO2: 28 mmol/L (ref 20–32)
Calcium: 9.4 mg/dL (ref 8.6–10.4)
Chloride: 106 mmol/L (ref 98–110)
Creat: 0.58 mg/dL (ref 0.50–1.03)
Globulin: 2.8 g/dL (calc) (ref 1.9–3.7)
Glucose, Bld: 88 mg/dL (ref 65–99)
Potassium: 5 mmol/L (ref 3.5–5.3)
Sodium: 142 mmol/L (ref 135–146)
Total Bilirubin: 0.3 mg/dL (ref 0.2–1.2)
Total Protein: 6.8 g/dL (ref 6.1–8.1)
eGFR: 105 mL/min/{1.73_m2} (ref >=60)

## 2021-08-13 LAB — LIPID PANEL
Cholesterol: 169 mg/dL (ref <200)
HDL: 56 mg/dL (ref >=50)
LDL Cholesterol (Calc): 96 mg/dL (calc)
Non-HDL Cholesterol (Calc): 113 mg/dL (calc) (ref <130)
Total CHOL/HDL Ratio: 3 (calc) (ref <5.0)
Triglycerides: 77 mg/dL (ref <150)

## 2021-08-13 LAB — QUANTIFERON-TB GOLD PLUS
Mitogen-NIL: >10 IU/mL
NIL: 0.05 IU/mL
QuantiFERON-TB Gold Plus: NEGATIVE
TB1-NIL: 0 IU/mL
TB2-NIL: 0 IU/mL

## 2021-08-13 LAB — HEMOGLOBIN A1C
Hgb A1c MFr Bld: 5.7 % of total Hgb — ABNORMAL HIGH (ref <5.7)
Mean Plasma Glucose: 117 mg/dL
eAG (mmol/L): 6.5 mmol/L

## 2021-09-08 ENCOUNTER — Ambulatory Visit: Payer: 59 | Admitting: Family Medicine

## 2021-09-08 NOTE — Progress Notes (Signed)
Name: Makayla Mason   MRN: [id number]    DOB: 11/28/1965   Date:09/09/2021       Progress Note  Subjective  Chief Complaint  Follow Up  HPI  Insulin Resistance: She denies polyphagia, polyuria or polyphagia.Last A1C was  down from 5.8 % to 5.7 %  she has been drinking more water, no longer drinking sodas daily .Cutting down on sweets   Dyslipidemia:   The 10-year ASCVD risk score (Arnett DK, et al., 2019) is: 2.2%   Values used to calculate the score:     Age: 55 years     Sex: Female     Is Non-Hispanic African American: Yes     Diabetic: No     Tobacco smoker: No     Systolic Blood Pressure: 440 mmHg     Is BP treated: No     HDL Cholesterol: 56 mg/dL     Total Cholesterol: 169 mg/dL    GERD: she takes Omeprazole before dinner , and symptoms are controlled, no heartburn or regurgitation. Unchanged   Lower abdominal pain: resolved   Perennial AR: she started allergy shots Fall 2019 with ENT, sees Dr. Ladene Artist , she took a pause on allergy shots since symptoms under control, but is going back to see him in June. She is doing well off the shots  and will go back for re-evaluation Summer 2023 She denies nasal congestion, rhinorrhea,  wheezing or SOB   TMJ: she is doing better since she started using a mouthguard about one year ago   OA right knee and hip: seen by Dr. Rudene Christians Dorise Hiss PA in the past and had X-rays, she states pain has been getting progressively worse, she went to [facility] with severe pain back in 06/2020 , negative for DVT, CT angio negative, elevated WBC. We referred her to [place] but she did not want to go to [place]. She would like a second opinion , she is not sure who is in network   Paresthesia: she continues to have a burning sensation on right side of neck and scapular area when she showers, also has burning sensation on her face when she washes it, started a few weeks ago No weakness or numbness anywhere else. Discussed MS. No change in cosmetics    Patient Active Problem List   Diagnosis Date Noted   Sprain of ulnar collateral ligament of metacarpophalangeal (MCP) joint of right thumb 06/08/2020   Cervical radicular pain 07/15/2018   Overweight (BMI 25.0-29.9) 01/29/2018   Right hip pain 01/29/2018   Insulin resistance 02/28/2017   Low grade squamous intraepith lesion on cytologic smear cervix (lgsil) 02/28/2017   GERD without esophagitis 03/15/2016   Left ankle pain 03/15/2016   Allergic rhinitis, seasonal 05/21/2015   Scoliosis 05/21/2015   Arthralgia of temporomandibular joint 05/21/2015    Past Surgical History:  Procedure Laterality Date   COLONOSCOPY WITH PROPOFOL N/A 02/15/2018   Procedure: COLONOSCOPY WITH PROPOFOL;  Surgeon: Jonathon Bellows, MD;  Location: [facility] ENDOSCOPY;  Service: Gastroenterology;  Laterality: N/A;    Family History  Problem Relation Age of Onset   Breast cancer Cousin    Diabetes Mother    Heart murmur Mother    Diabetes Father    Hypertension Father    Asthma Daughter    Anemia Maternal Aunt    Stroke Maternal Aunt    Kidney disease Maternal Grandmother    Hypertension Maternal Grandmother    Heart failure Maternal Grandmother    Heart  attack Maternal Grandfather     Social History   Tobacco Use   Smoking status: Never   Smokeless tobacco: Never  Substance Use Topics   Alcohol use: Yes    Alcohol/week: 1.0 standard drink    Types: 1 Glasses of wine per week     Current Outpatient Medications:    montelukast (SINGULAIR) 10 MG tablet, TAKE 1 TABLET BY MOUTH AT BEDTIME., Disp: 90 tablet, Rfl: 1   omeprazole (PRILOSEC) 40 MG capsule, TAKE 1 CAPSULE BY MOUTH DAILY., Disp: 90 capsule, Rfl: 1   triamcinolone (NASACORT ALLERGY 24HR) 55 MCG/ACT AERO nasal inhaler, Place 1 spray into the nose daily., Disp: 3 each, Rfl: 1  Allergies  Allergen Reactions   Codeine Nausea And Vomiting    I personally reviewed active problem list, medication list, allergies, family history, social  history, health maintenance with the patient/caregiver today.   ROS  Constitutional: Negative for fever or weight change.  Respiratory: Negative for cough and shortness of breath.   Cardiovascular: Negative for chest pain or palpitations.  Gastrointestinal: Negative for abdominal pain, no bowel changes.  Musculoskeletal: Positive for gait problem  ( chronic due to hop pain) or joint swelling.  Skin: Negative for rash.  Neurological: Negative for dizziness or headache.  No other specific complaints in a complete review of systems (except as listed in HPI above).   Objective  Vitals:   09/09/21 0855  BP: 112/72  Pulse: 94  Resp: 16  Temp: 98.1 F (36.7 C)  SpO2: 99%  Weight: 145 lb (65.8 kg)  Height: $Remove'5\' 1"'tSdPgjx$  (1.549 m)    Body mass index is 27.4 kg/m.  Physical Exam  Constitutional: Patient appears well-developed and well-nourished. Overweight  No distress.  HEENT: head atraumatic, normocephalic, pupils equal and reactive to light, ears normal TM, neck supple Cardiovascular: Normal rate, regular rhythm and normal heart sounds.  No murmur heard. No BLE edema. Pulmonary/Chest: Effort normal and breath sounds normal. No respiratory distress. Abdominal: Soft.  There is no tenderness. Psychiatric: Patient has a normal mood and affect. behavior is normal. Judgment and thought content normal.  Neurological: normal sensation Muscular skeletal: she seems to be weaker on right hip, pain with rom of both hips   Recent Results (from the past 2160 hour(s))  Lipid panel     Status: None   Collection Time: 08/10/21 10:48 AM  Result Value Ref Range   Cholesterol 169 <200 mg/dL   HDL 56 > OR = 50 mg/dL   Triglycerides 77 <150 mg/dL   LDL Cholesterol (Calc) 96 mg/dL (calc)    Comment: Reference range: <100 . Desirable range <100 mg/dL for primary prevention;   <70 mg/dL for patients with CHD or diabetic patients  with > or = 2 CHD risk factors. Marland Kitchen LDL-C is now calculated using the  Martin-Hopkins  calculation, which is a validated novel method providing  better accuracy than the Friedewald equation in the  estimation of LDL-C.  Cresenciano Genre et al. Annamaria Helling. 7703;403(52): 2061-2068  (http://education.[facility].com/faq/FAQ164)    Total CHOL/HDL Ratio 3.0 <5.0 (calc)   Non-HDL Cholesterol (Calc) 113 <130 mg/dL (calc)    Comment: For patients with diabetes plus 1 major ASCVD risk  factor, treating to a non-HDL-C goal of <100 mg/dL  (LDL-C of <70 mg/dL) is considered a therapeutic  option.   CBC with Differential/Platelet     Status: Abnormal   Collection Time: 08/10/21 10:48 AM  Result Value Ref Range   WBC 8.6 3.8 - 10.8 Thousand/uL  RBC 4.56 3.80 - 5.10 Million/uL   Hemoglobin 12.7 11.7 - 15.5 g/dL   HCT 40.1 35.0 - 45.0 %   MCV 87.9 80.0 - 100.0 fL   MCH 27.9 27.0 - 33.0 pg   MCHC 31.7 (L) 32.0 - 36.0 g/dL   RDW 13.7 11.0 - 15.0 %   Platelets 364 140 - 400 Thousand/uL   MPV 10.5 7.5 - 12.5 fL   Neutro Abs 4,223 1,500 - 7,800 cells/uL   Lymphs Abs 3,311 850 - 3,900 cells/uL   Absolute Monocytes 611 200 - 950 cells/uL   Eosinophils Absolute 413 15 - 500 cells/uL   Basophils Absolute 43 0 - 200 cells/uL   Neutrophils Relative % 49.1 %   Total Lymphocyte 38.5 %   Monocytes Relative 7.1 %   Eosinophils Relative 4.8 %   Basophils Relative 0.5 %  COMPLETE METABOLIC PANEL WITH GFR     Status: None   Collection Time: 08/10/21 10:48 AM  Result Value Ref Range   Glucose, Bld 88 65 - 99 mg/dL    Comment: .            Fasting reference interval .    BUN 12 7 - 25 mg/dL   Creat 0.58 0.50 - 1.03 mg/dL   eGFR 105 > OR = 60 mL/min/1.25m2    Comment: The eGFR is based on the CKD-EPI 2021 equation. To calculate  the new eGFR from a previous Creatinine or Cystatin C result, go to https://www.kidney.org/professionals/ kdoqi/gfr%5Fcalculator    BUN/Creatinine Ratio NOT APPLICABLE 6 - 22 (calc)   Sodium 142 135 - 146 mmol/L   Potassium 5.0 3.5 - 5.3 mmol/L    Chloride 106 98 - 110 mmol/L   CO2 28 20 - 32 mmol/L   Calcium 9.4 8.6 - 10.4 mg/dL   Total Protein 6.8 6.1 - 8.1 g/dL   Albumin 4.0 3.6 - 5.1 g/dL   Globulin 2.8 1.9 - 3.7 g/dL (calc)   AG Ratio 1.4 1.0 - 2.5 (calc)   Total Bilirubin 0.3 0.2 - 1.2 mg/dL   Alkaline phosphatase (APISO) 81 37 - 153 U/L   AST 14 10 - 35 U/L   ALT 12 6 - 29 U/L  Hemoglobin A1c     Status: Abnormal   Collection Time: 08/10/21 10:48 AM  Result Value Ref Range   Hgb A1c MFr Bld 5.7 (H) <5.7 % of total Hgb    Comment: For someone without known diabetes, a hemoglobin  A1c value between 5.7% and 6.4% is consistent with prediabetes and should be confirmed with a  follow-up test. . For someone with known diabetes, a value <7% indicates that their diabetes is well controlled. A1c targets should be individualized based on duration of diabetes, age, comorbid conditions, and other considerations. . This assay result is consistent with an increased risk of diabetes. . Currently, no consensus exists regarding use of hemoglobin A1c for diagnosis of diabetes for children. .    Mean Plasma Glucose 117 mg/dL   eAG (mmol/L) 6.5 mmol/L  QuantiFERON-TB Gold Plus     Status: None   Collection Time: 08/10/21 10:48 AM  Result Value Ref Range   QuantiFERON-TB Gold Plus NEGATIVE NEGATIVE    Comment: Negative test result. M. tuberculosis complex  infection unlikely.    NIL 0.05 IU/mL   Mitogen-NIL >10.00 IU/mL   TB1-NIL 0.00 IU/mL   TB2-NIL 0.00 IU/mL    Comment: . The Nil tube value reflects the background interferon gamma immune response of the  patient's blood sample. This value has been subtracted from the patient's displayed TB and Mitogen results. . Lower than expected results with the Mitogen tube prevent false-negative Quantiferon readings by detecting a patient with a potential immune suppressive condition and/or suboptimal pre-analytical specimen handling. . The TB1 Antigen tube is coated with  the M. tuberculosis-specific antigens designed to elicit responses from TB antigen primed CD4+ helper T-lymphocytes. . The TB2 Antigen tube is coated with the M. tuberculosis-specific antigens designed to elicit responses from TB antigen primed CD4+ helper and CD8+ cytotoxic T-lymphocytes. . For additional information, please refer to https://education.[facility].com/faq/FAQ204 (This link is being provided for informational/ educational purposes only.) .      PHQ2/9: Depression screen [facility] 2/9 09/09/2021 08/10/2021 03/07/2021 09/10/2020 07/21/2020  Decreased Interest 0 0 0 0 0  Down, Depressed, Hopeless 0 0 0 0 0  PHQ - 2 Score 0 0 0 0 0  Altered sleeping 0 - - - -  Tired, decreased energy 0 - - - -  Change in appetite 0 - - - -  Feeling bad or failure about yourself  0 - - - -  Trouble concentrating 0 - - - -  Moving slowly or fidgety/restless 0 - - - -  Suicidal thoughts 0 - - - -  PHQ-9 Score 0 - - - -  Difficult doing work/chores - - - - -  Some recent data might be hidden    phq 9 is negative   Fall Risk: Fall Risk  09/09/2021 08/10/2021 03/07/2021 09/10/2020 07/21/2020  Falls in the past year? 0 0 0 0 0  Number falls in past yr: 0 0 0 0 0  Injury with Fall? 0 0 0 0 0  Comment - - - - -  Risk for fall due to : No Fall Risks No Fall Risks - - -  Follow up Falls prevention discussed Falls prevention discussed - Falls evaluation completed -      Functional Status Survey: Is the patient deaf or have difficulty hearing?: No Does the patient have difficulty seeing, even when wearing glasses/contacts?: No Does the patient have difficulty concentrating, remembering, or making decisions?: No Does the patient have difficulty walking or climbing stairs?: No Does the patient have difficulty dressing or bathing?: No Does the patient have difficulty doing errands alone such as visiting a doctor's office or shopping?: No    Assessment & Plan  1. Primary osteoarthritis of  right hip  - Ambulatory referral to Orthopedic Surgery  2. Chronic pain of right knee  - Ambulatory referral to Orthopedic Surgery  3. Chronic right hip pain  - Ambulatory referral to Orthopedic Surgery  4. GERD without esophagitis  - omeprazole (PRILOSEC) 40 MG capsule; TAKE 1 CAPSULE BY MOUTH DAILY.  Dispense: 90 capsule; Refill: 1  5. Perennial allergic rhinitis with seasonal variation  - montelukast (SINGULAIR) 10 MG tablet; TAKE 1 TABLET BY MOUTH AT BEDTIME.  Dispense: 90 tablet; Refill: 1  6. Paresthesia  - MR Brain W Wo Contrast; Future - MR Cervical Spine Wo Contrast; Future

## 2021-09-09 ENCOUNTER — Ambulatory Visit (INDEPENDENT_AMBULATORY_CARE_PROVIDER_SITE_OTHER): Payer: BC Managed Care – PPO | Admitting: Family Medicine

## 2021-09-09 ENCOUNTER — Other Ambulatory Visit: Payer: Self-pay

## 2021-09-09 ENCOUNTER — Telehealth: Payer: Self-pay

## 2021-09-09 ENCOUNTER — Encounter: Payer: Self-pay | Admitting: Family Medicine

## 2021-09-09 VITALS — BP 112/72 | HR 94 | Temp 98.1°F | Resp 16 | Ht 61.0 in | Wt 145.0 lb

## 2021-09-09 DIAGNOSIS — R202 Paresthesia of skin: Secondary | ICD-10-CM

## 2021-09-09 DIAGNOSIS — M25561 Pain in right knee: Secondary | ICD-10-CM

## 2021-09-09 DIAGNOSIS — K219 Gastro-esophageal reflux disease without esophagitis: Secondary | ICD-10-CM

## 2021-09-09 DIAGNOSIS — M1611 Unilateral primary osteoarthritis, right hip: Secondary | ICD-10-CM

## 2021-09-09 DIAGNOSIS — J302 Other seasonal allergic rhinitis: Secondary | ICD-10-CM

## 2021-09-09 DIAGNOSIS — J3089 Other allergic rhinitis: Secondary | ICD-10-CM

## 2021-09-09 DIAGNOSIS — M25551 Pain in right hip: Secondary | ICD-10-CM

## 2021-09-09 DIAGNOSIS — G8929 Other chronic pain: Secondary | ICD-10-CM

## 2021-09-09 MED ORDER — MONTELUKAST SODIUM 10 MG PO TABS
ORAL_TABLET | Freq: Every day | ORAL | 1 refills | Status: DC
  Filled 2021-09-09: qty 90, 90d supply, fill #0
  Filled 2022-02-06 – 2022-03-13 (×2): qty 90, 90d supply, fill #1

## 2021-09-09 MED ORDER — OMEPRAZOLE 40 MG PO CPDR
DELAYED_RELEASE_CAPSULE | Freq: Every day | ORAL | 1 refills | Status: DC
  Filled 2021-09-09: qty 90, 90d supply, fill #0
  Filled 2022-05-29: qty 90, 90d supply, fill #1

## 2021-09-09 NOTE — Telephone Encounter (Signed)
I called to inform this patient that she has been scheduled to have her imaging at [facility] (Since the requesting site was not listed in [employer] profile) and that she is to arrive at 9:30am for a 10am appt on Wednesday, September 21, 2021 then the other one will follow at 11am.   Patient was advised to give [employer]  a call at [phone] if she wanted more information.

## 2021-09-10 ENCOUNTER — Encounter: Payer: Self-pay | Admitting: Family Medicine

## 2021-09-13 ENCOUNTER — Other Ambulatory Visit: Payer: Self-pay

## 2021-09-13 DIAGNOSIS — L2389 Allergic contact dermatitis due to other agents: Secondary | ICD-10-CM | POA: Diagnosis not present

## 2021-09-13 MED ORDER — CLOBETASOL PROPIONATE 0.05 % EX SOLN
CUTANEOUS | 2 refills | Status: DC
  Filled 2021-09-13: qty 50, 30d supply, fill #0

## 2021-09-13 MED ORDER — HYDROCORTISONE 2.5 % EX OINT
TOPICAL_OINTMENT | CUTANEOUS | 0 refills | Status: DC
  Filled 2021-09-13: qty 28.35, 30d supply, fill #0

## 2021-09-16 ENCOUNTER — Other Ambulatory Visit: Payer: Self-pay

## 2021-09-19 NOTE — Patient Instructions (Signed)
Breast Self-Awareness Breast self-awareness is knowing how your breasts look and feel. Doing breast self-awareness is important. It allows you to catch a breast problem early while it is still small and can be treated. All women should do breast self-awareness, including women who have had breast implants. Tell your doctor if you notice a change in your breasts. What you need: A mirror. A well-lit room. How to do a breast self-exam A breast self-exam is one way to learn what is normal for your breasts and to check for changes. To do a breast self-exam: Look for changes  Take off all the clothes above your waist. Stand in front of a mirror in a room with good lighting. Put your hands on your hips. Push your hands down. Look at your breasts and nipples in the mirror to see if one breast or nipple looks different from the other. Check to see if: The shape of one breast is different. The size of one breast is different. There are wrinkles, dips, and bumps in one breast and not the other. Look at each breast for changes in the skin, such as: Redness. Scaly areas. Look for changes in your nipples, such as: Liquid around the nipples. Bleeding. Dimpling. Redness. A change in where the nipples are. Feel for changes  Lie on your back on the floor. Feel each breast. To do this, follow these steps: Pick a breast to feel. Put the arm closest to that breast above your head. Use your other arm to feel the nipple area of your breast. Feel the area with the pads of your three middle fingers by making small circles with your fingers. For the first circle, press lightly. For the second circle, press harder. For the third circle, press even harder. Keep making circles with your fingers at the different pressures as you move down your breast. Stop when you feel your ribs. Move your fingers a little toward the center of your body. Start making circles with your fingers again, this time going up until  you reach your collarbone. Keep making up-and-down circles until you reach your armpit. Remember to keep using the three pressures. Feel the other breast in the same way. Sit or stand in the tub or shower. With soapy water on your skin, feel each breast the same way you did in step 2 when you were lying on the floor. Write down what you find Writing down what you find can help you remember what to tell your doctor. Write down: What is normal for each breast. Any changes you find in each breast, including: The kind of changes you find. Whether you have pain. Size and location of any lumps. When you last had your menstrual period. General tips Check your breasts every month. If you are breastfeeding, the best time to check your breasts is after you feed your baby or after you use a breast pump. If you get menstrual periods, the best time to check your breasts is 5-7 days after your menstrual period is over. With time, you will become comfortable with the self-exam, and you will begin to know if there are changes in your breasts. Contact a doctor if you: See a change in the shape or size of your breasts or nipples. See a change in the skin of your breast or nipples, such as red or scaly skin. Have fluid coming from your nipples that is not normal. Find a lump or thick area that was not there before. Have pain in   your breasts. °Have any concerns about your breast health. °Summary °Breast self-awareness includes looking for changes in your breasts, as well as feeling for changes within your breasts. °Breast self-awareness should be done in front of a mirror in a well-lit room. °You should check your breasts every month. If you get menstrual periods, the best time to check your breasts is 5-7 days after your menstrual period is over. °Let your doctor know of any changes you see in your breasts, including changes in size, changes on the skin, pain or tenderness, or fluid from your nipples that is not  normal. °This information is not intended to replace advice given to you by your health care provider. Make sure you discuss any questions you have with your health care provider. °Document Revised: 06/11/2018 Document Reviewed: 06/11/2018 °Elsevier Patient Education © 2022 [employer] Inc[employer] °Preventive Care 40-64 Years Old, Female °Preventive care refers to lifestyle choices and visits with your health care provider that can promote health and wellness. Preventive care visits are also called wellness exams. °What can I expect for my preventive care visit? °Counseling °Your health care provider may ask you questions about your: °Medical history, including: °Past medical problems. °Family medical history. °Pregnancy history. °Current health, including: °Menstrual cycle. °Method of birth control. °Emotional well-being. °Home life and relationship well-being. °Sexual activity and sexual health. °Lifestyle, including: °Alcohol, nicotine or tobacco, and drug use. °Access to firearms. °Diet, exercise, and sleep habits. °Work and work environment. °Sunscreen use. °Safety issues such as seatbelt and bike helmet use. °Physical exam °Your health care provider will check your: °Height and weight. These may be used to calculate your BMI (body mass index). BMI is a measurement that tells if you are at a healthy weight. °Waist circumference. This measures the distance around your waistline. This measurement also tells if you are at a healthy weight and may help predict your risk of certain diseases, such as type 2 diabetes and high blood pressure. °Heart rate and blood pressure. °Body temperature. °Skin for abnormal spots. °What immunizations do I need? °Vaccines are usually given at various ages, according to a schedule. Your health care provider will recommend vaccines for you based on your age, medical history, and lifestyle or other factors, such as travel or where you work. °What tests do I need? °Screening °Your health care  provider may recommend screening tests for certain conditions. This may include: °Lipid and cholesterol levels. °Diabetes screening. This is done by checking your blood sugar (glucose) after you have not eaten for a while (fasting). °Pelvic exam and Pap test. °Hepatitis B test. °Hepatitis C test. °HIV (human immunodeficiency virus) test. °STI (sexually transmitted infection) testing, if you are at risk. °Lung cancer screening. °Colorectal cancer screening. °Mammogram. Talk with your health care provider about when you should start having regular mammograms. This may depend on whether you have a family history of breast cancer. °BRCA-related cancer screening. This may be done if you have a family history of breast, ovarian, tubal, or peritoneal cancers. °Bone density scan. This is done to screen for osteoporosis. °Talk with your health care provider about your test results, treatment options, and if necessary, the need for more tests. °Follow these instructions at home: °Eating and drinking ° °Eat a diet that includes fresh fruits and vegetables, whole grains, lean protein, and low-fat dairy products. °Take vitamin and mineral supplements as recommended by your health care provider. °Do not drink alcohol if: °Your health care provider tells you not to drink. °You are pregnant,   may be pregnant, or are planning to become pregnant. °If you drink alcohol: °Limit how much you have to 0-1 drink a day. °Know how much alcohol is in your drink. In the [place] one drink equals one 12 oz bottle of beer (355 mL), one 5 oz glass of wine (148 mL), or one 1½ oz glass of hard liquor (44 mL). °Lifestyle °Brush your teeth every morning and night with fluoride toothpaste. Floss one time each day. °Exercise for at least 30 minutes 5 or more days each week. °Do not use any products that contain nicotine or tobacco. These products include cigarettes, chewing tobacco, and vaping devices, such as e-cigarettes. If you need help quitting, ask  your health care provider. °Do not use drugs. °If you are sexually active, practice safe sex. Use a condom or other form of protection to prevent STIs. °If you do not wish to become pregnant, use a form of birth control. If you plan to become pregnant, see your health care provider for a prepregnancy visit. °Take aspirin only as told by your health care provider. Make sure that you understand how much to take and what form to take. Work with your health care provider to find out whether it is safe and beneficial for you to take aspirin daily. °Find healthy ways to manage stress, such as: °Meditation, yoga, or listening to music. °Journaling. °Talking to a trusted person. °Spending time with friends and family. °Minimize exposure to UV radiation to reduce your risk of skin cancer. °Safety °Always wear your seat belt while driving or riding in a vehicle. °Do not drive: °If you have been drinking alcohol. Do not ride with someone who has been drinking. °When you are tired or distracted. °While texting. °If you have been using any mind-altering substances or drugs. °Wear a helmet and other protective equipment during sports activities. °If you have firearms in your house, make sure you follow all gun safety procedures. °Seek help if you have been physically or sexually abused. °What's next? °Visit your health care provider once a year for an annual wellness visit. °Ask your health care provider how often you should have your eyes and teeth checked. °Stay up to date on all vaccines. °This information is not intended to replace advice given to you by your health care provider. Make sure you discuss any questions you have with your health care provider. °Document Revised: 04/20/2021 Document Reviewed: 04/20/2021 °Elsevier Patient Education © 2022 Elsevier Inc. ° °

## 2021-09-20 ENCOUNTER — Ambulatory Visit (INDEPENDENT_AMBULATORY_CARE_PROVIDER_SITE_OTHER): Payer: BC Managed Care – PPO | Admitting: Obstetrics and Gynecology

## 2021-09-20 ENCOUNTER — Other Ambulatory Visit: Payer: Self-pay

## 2021-09-20 ENCOUNTER — Encounter: Payer: Self-pay | Admitting: Obstetrics and Gynecology

## 2021-09-20 ENCOUNTER — Ambulatory Visit (INDEPENDENT_AMBULATORY_CARE_PROVIDER_SITE_OTHER): Payer: BC Managed Care – PPO

## 2021-09-20 ENCOUNTER — Other Ambulatory Visit (HOSPITAL_COMMUNITY)
Admission: RE | Admit: 2021-09-20 | Discharge: 2021-09-20 | Disposition: A | Discharge status: Home / Self Care | Payer: BC Managed Care – PPO | Source: Ambulatory Visit | Attending: Obstetrics and Gynecology | Admitting: Obstetrics and Gynecology

## 2021-09-20 VITALS — BP 123/68 | HR 84 | Ht 61.0 in | Wt 146.7 lb

## 2021-09-20 DIAGNOSIS — N951 Menopausal and female climacteric states: Secondary | ICD-10-CM

## 2021-09-20 DIAGNOSIS — Z124 Encounter for screening for malignant neoplasm of cervix: Secondary | ICD-10-CM | POA: Insufficient documentation

## 2021-09-20 DIAGNOSIS — N952 Postmenopausal atrophic vaginitis: Secondary | ICD-10-CM | POA: Diagnosis not present

## 2021-09-20 DIAGNOSIS — Z01419 Encounter for gynecological examination (general) (routine) without abnormal findings: Secondary | ICD-10-CM | POA: Diagnosis not present

## 2021-09-20 DIAGNOSIS — Z8742 Personal history of other diseases of the female genital tract: Secondary | ICD-10-CM | POA: Insufficient documentation

## 2021-09-20 DIAGNOSIS — Z1231 Encounter for screening mammogram for malignant neoplasm of breast: Secondary | ICD-10-CM

## 2021-09-20 DIAGNOSIS — R7303 Prediabetes: Secondary | ICD-10-CM

## 2021-09-20 DIAGNOSIS — Z23 Encounter for immunization: Secondary | ICD-10-CM | POA: Diagnosis not present

## 2021-09-20 NOTE — Addendum Note (Signed)
Addended by: Edwyna Shell on: 09/20/2021 01:54 PM   Modules accepted: Orders

## 2021-09-20 NOTE — Progress Notes (Signed)
ANNUAL PREVENTATIVE CARE GYNECOLOGY  ENCOUNTER NOTE Subjective:       Makayla Mason is a 55 y.o. [phone] menopausal female here for a routine annual gynecologic exam. The patient is not sexually active. The patient has never taken hormone replacement therapy. Patient denies post-menopausal vaginal bleeding. The patient wears seatbelts: yes. The patient participates in regular exercise: no. Has the patient ever been transfused or tattooed?: no.     Gynecologic History No LMP recorded. Patient is postmenopausal. Contraception: post menopausal status Last Pap: 06/08/2020. Results were: normal.  Has history of abnormal pap smears in the past (LGSIL in 2018, NILM but HPV+ pap smear in 2019). Last mammogram: 05/11/2021. Results were: normal Last Colonoscopy: 02/15/2018.  Results were: normal.   Obstetric History OB History  Gravida Para Term Preterm AB Living  2 2 2     2   SAB IAB Ectopic Multiple Live Births          2    # Outcome Date GA Lbr Len/2nd Weight Sex Delivery Anes PTL Lv  2 Term 1997    F Vag-Spont   LIV  1 Term 1995    F Vag-Spont   LIV    Past Medical History:  Diagnosis Date   Allergic rhinitis, seasonal    Mild scoliosis    TMJ arthralgia    right side    Family History  Problem Relation Age of Onset   Breast cancer Cousin    Diabetes Mother    Heart murmur Mother    Diabetes Father    Hypertension Father    Asthma Daughter    Anemia Maternal Aunt    Stroke Maternal Aunt    Kidney disease Maternal Grandmother    Hypertension Maternal Grandmother    Heart failure Maternal Grandmother    Heart attack Maternal Grandfather     Past Surgical History:  Procedure Laterality Date   COLONOSCOPY WITH PROPOFOL N/A 02/15/2018   Procedure: COLONOSCOPY WITH PROPOFOL;  Surgeon: Jonathon Bellows, MD;  Location: [facility] ENDOSCOPY;  Service: Gastroenterology;  Laterality: N/A;    Social History   Socioeconomic History   Marital status: Divorced    Spouse name: Not on file    Number of children: 2   Years of education: Not on file   Highest education level: Bachelor's degree (e.g., BA, AB, BS)  Occupational History   Not on file  Tobacco Use   Smoking status: Never   Smokeless tobacco: Never  Vaping Use   Vaping Use: Never used  Substance and Sexual Activity   Alcohol use: Yes    Alcohol/week: 1.0 standard drink    Types: 1 Glasses of wine per week   Drug use: No   Sexual activity: Not Currently    Birth control/protection: Post-menopausal  Other Topics Concern   Not on file  Social History Narrative   Lives in [place], grown daughters and grandson are at home   Social Determinants of Health   Financial Resource Strain: Medium Risk   Difficulty of Paying Living Expenses: Somewhat hard  Food Insecurity: Food Insecurity Present   Worried About Charity fundraiser in the Last Year: Sometimes true   Arboriculturist in the Last Year: Often true  Transportation Needs: No Transportation Needs   Lack of Transportation (Medical): No   Lack of Transportation (Non-Medical): No  Physical Activity: Sufficiently Active   Days of Exercise per Week: 5 days   Minutes of Exercise per Session: 60 min  Stress:  Stress Concern Present   Feeling of Stress : To some extent  Social Connections: Moderately Integrated   Frequency of Communication with Friends and Family: Three times a week   Frequency of Social Gatherings with Friends and Family: Once a week   Attends Religious Services: More than 4 times per year   Active Member of [employer] or Organizations: Yes   Attends Archivist Meetings: 1 to 4 times per year   Marital Status: Divorced  Human resources officer Violence: Not At Risk   Fear of Current or Ex-Partner: No   Emotionally Abused: No   Physically Abused: No   Sexually Abused: No    Current Outpatient Medications on File Prior to Visit  Medication Sig Dispense Refill   clobetasol (TEMOVATE) 0.05 % external solution 1(one) application(s)  topical 2(two) times a day 50 mL 2   hydrocortisone 2.5 % ointment 1(one) application(s) topical every day for burning of lips 28.35 g 0   montelukast (SINGULAIR) 10 MG tablet TAKE 1 TABLET BY MOUTH AT BEDTIME. 90 tablet 1   omeprazole (PRILOSEC) 40 MG capsule TAKE 1 CAPSULE BY MOUTH DAILY. 90 capsule 1   No current facility-administered medications on file prior to visit.    Allergies  Allergen Reactions   Codeine Nausea And Vomiting      Review of Systems  General ROS: negative for - chills, fatigue, fever, weight gain or weight loss. Psychological ROS: negative for - anxiety, decreased libido, depression, mood swings, physical abuse or sexual abuse Ophthalmic ROS: negative for - blurry vision, eye pain or loss of vision ENT ROS: negative for - headaches, hearing change, visual changes or vocal changes Allergy and Immunology ROS: negative for - hives, itchy/watery eyes or seasonal allergies Hematological and Lymphatic ROS: negative for - bleeding problems, bruising, swollen lymph nodes or weight loss Endocrine ROS: negative for - galactorrhea, hair pattern changes, hot flashes, malaise/lethargy, mood swings, palpitations, polydipsia/polyuria, skin changes, temperature intolerance or unexpected weight changes Breast ROS: negative for - new or changing breast lumps or nipple discharge Respiratory ROS: negative for - cough or shortness of breath Cardiovascular ROS: negative for - chest pain, irregular heartbeat, palpitations or shortness of breath Gastrointestinal ROS: no abdominal pain, change in bowel habits, or black or bloody stools Genito-Urinary ROS: no dysuria, trouble voiding, or hematuria Musculoskeletal ROS: negative for - joint stiffness.  Positive for neck pain Neurological ROS: negative for - bowel and bladder control changes Dermatological ROS: negative for rash and skin lesion changes   Objective:   BP 123/68 (BP Location: Left Arm, Patient Position: Sitting, Cuff  Size: Normal)   Pulse 84   Ht 5\' 1"  (1.549 m)   Wt 146 lb 11.2 oz (66.5 kg)   BMI 27.72 kg/m  CONSTITUTIONAL: Well-developed, well-nourished female in no acute distress.  PSYCHIATRIC: Normal mood and affect. Normal behavior. Normal judgment and thought content. [place]: Alert and oriented to person, place, and time. Normal muscle tone coordination. No cranial nerve deficit noted. HENT:  Normocephalic, atraumatic, External right and left ear normal. Oropharynx is clear and moist EYES: Conjunctivae and EOM are normal. Pupils are equal, round, and reactive to light. No scleral icterus.  NECK: Normal range of motion, supple, no masses.  Normal thyroid.  SKIN: Skin is warm and dry. No rash noted. Not diaphoretic. No erythema. No pallor. CARDIOVASCULAR: Normal heart rate noted, regular rhythm, no murmur. RESPIRATORY: Clear to auscultation bilaterally. Effort and breath sounds normal, no problems with respiration noted. BREASTS: Symmetric in size. No  masses, skin changes, nipple drainage, or lymphadenopathy. ABDOMEN: Soft, normal bowel sounds, no distention noted.  No tenderness, rebound or guarding.  BLADDER: Normal PELVIC:  Bladder no bladder distension noted  Urethra: normal appearing urethra with no masses, tenderness or lesions  Vulva: not indicated  Vagina: mildly atrophic mucosa, no lesions or vaginal discharge.   Cervix: normal appearing cervix without discharge or lesions  Uterus: uterus is normal size, shape, consistency and nontender  Adnexa: normal adnexa in size, nontender and no masses  RV: External Exam NormaI, No Rectal Masses and Normal Sphincter tone  MUSCULOSKELETAL: Normal range of motion. No tenderness.  No cyanosis, clubbing, or edema.  2+ distal pulses. LYMPHATIC: No Axillary, Supraclavicular, or Inguinal Adenopathy.   Labs: Lab Results  Component Value Date   WBC 8.6 08/10/2021   HGB 12.7 08/10/2021   HCT 40.1 08/10/2021   MCV 87.9 08/10/2021   PLT 364  08/10/2021    Lab Results  Component Value Date   CREATININE 0.58 08/10/2021   BUN 12 08/10/2021   NA 142 08/10/2021   K 5.0 08/10/2021   CL 106 08/10/2021   CO2 28 08/10/2021    Lab Results  Component Value Date   ALT 12 08/10/2021   AST 14 08/10/2021   ALKPHOS 88 02/01/2017   BILITOT 0.3 08/10/2021    Lab Results  Component Value Date   CHOL 169 08/10/2021   HDL 56 08/10/2021   LDLCALC 96 08/10/2021   TRIG 77 08/10/2021   CHOLHDL 3.0 08/10/2021    Lab Results  Component Value Date   TSH 3.700 06/08/2020     Lab Results  Component Value Date   HGBA1C 5.7 (H) 08/10/2021     Assessment:   1. Encounter for well woman exam with routine gynecological exam   2. Menopausal vasomotor syndrome   3. Vaginal atrophy   4. History of abnormal cervical Pap smear   5. Breast cancer screening by mammogram   6. Prediabetes     Plan:  - Pap: Pap Co Test performed in light of history of abnormal pap smears - Mammogram:  Up to date .  - Stool Guaiac Testing:  Not Indicated. Patient up to date with colonoscopy.  - Labs: Reviewed labs from ER visit. Also ordered Lipid panel and TSH.  - Routine preventative health maintenance measures emphasized: Exercise/Diet/Weight control, Tobacco Warnings, Alcohol/Substance use risks, Stress Management, Peer Pressure Issues and Safe Sex - Continue lifestyle interventions.  - Vaginal atrophy not bothersome to patient. No treatment indicated. - COVID vaccination series completed (see Epic documentation)  - Flu vaccine up to date.  - Due for pneumococcal vaccine, to receie with PCP.  - Return to Clinic - 1 Year  Rubie Maid, MD [facility]

## 2021-09-21 ENCOUNTER — Ambulatory Visit: Payer: BC Managed Care – PPO

## 2021-09-23 LAB — CYTOLOGY - PAP: Diagnosis: NEGATIVE

## 2021-10-20 ENCOUNTER — Other Ambulatory Visit: Payer: Self-pay

## 2021-10-20 DIAGNOSIS — M1611 Unilateral primary osteoarthritis, right hip: Secondary | ICD-10-CM | POA: Diagnosis not present

## 2021-10-20 MED ORDER — MELOXICAM 15 MG PO TABS
ORAL_TABLET | ORAL | 0 refills | Status: DC
  Filled 2021-10-20: qty 30, 30d supply, fill #0

## 2021-10-24 NOTE — Progress Notes (Signed)
Name: Makayla Mason   MRN: [id number]    DOB: 09-May-1966   Date:10/25/2021       Progress Note  Subjective  Chief Complaint  Pinched Nerve  HPI    Paresthesia: she continues to have a burning sensation on right side of neck and scapular area when she showers, also has burning sensation on her face when she washes it, started a few weeks ago No weakness or numbness anywhere else. Discussed MS. 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 Explained MS still a possibility,we can try lyrica to see if shoulder sensation resolves and check MRI c-spine or referral psyatrist if needed at later date   URI: symptoms started yesterday with rhinorrhea, post-nasal drainage and nasal congestion, no change in appetite , no fever or chills. She gets checked for COVID-19 every Friday at work and last test was negative. She states daughter has similar symptoms . She took Nygquil and Vicks and mucinex. She refused COVID-19 by PCR and or flu test today, explained risk of transmission at work     Patient Active Problem List   Diagnosis Date Noted   Sprain of ulnar collateral ligament of metacarpophalangeal (MCP) joint of right thumb 06/08/2020   Cervical radicular pain 07/15/2018   Overweight (BMI 25.0-29.9) 01/29/2018   Right hip pain 01/29/2018   Insulin resistance 02/28/2017   Low grade squamous intraepith lesion on cytologic smear cervix (lgsil) 02/28/2017   GERD without esophagitis 03/15/2016   Left ankle pain 03/15/2016   Allergic rhinitis, seasonal 05/21/2015   Scoliosis 05/21/2015   Arthralgia of temporomandibular joint 05/21/2015    Past Surgical History:  Procedure Laterality Date   COLONOSCOPY WITH PROPOFOL N/A 02/15/2018   Procedure: COLONOSCOPY WITH PROPOFOL;  Surgeon: Jonathon Bellows, MD;  Location: [facility] ENDOSCOPY;  Service:  Gastroenterology;  Laterality: N/A;    Family History  Problem Relation Age of Onset   Breast cancer Cousin    Diabetes Mother    Heart murmur Mother    Diabetes Father    Hypertension Father    Asthma Daughter    Anemia Maternal Aunt    Stroke Maternal Aunt    Kidney disease Maternal Grandmother    Hypertension Maternal Grandmother    Heart failure Maternal Grandmother    Heart attack Maternal Grandfather     Social History   Tobacco Use   Smoking status: Never   Smokeless tobacco: Never  Substance Use Topics   Alcohol use: Yes    Alcohol/week: 1.0 standard drink    Types: 1 Glasses of wine per week     Current Outpatient Medications:    clobetasol (TEMOVATE) 0.05 % external solution, 1(one) application(s) topical 2(two) times a day, Disp: 50 mL, Rfl: 2   hydrocortisone 2.5 % ointment, 1(one) application(s) topical every day for burning of lips, Disp: 28.35 g, Rfl: 0   meloxicam (MOBIC) 15 MG tablet, Take 1 tablet (15 mg total) by mouth once daily, Disp: 30 tablet, Rfl: 0   montelukast (SINGULAIR) 10 MG tablet, TAKE 1 TABLET BY MOUTH AT BEDTIME., Disp: 90 tablet, Rfl: 1   omeprazole (PRILOSEC) 40 MG capsule, TAKE 1 CAPSULE BY MOUTH DAILY., Disp: 90 capsule, Rfl: 1  Allergies  Allergen Reactions   Codeine Nausea And Vomiting    I personally reviewed active problem list, medication list, allergies, family history, social  history, health maintenance with the patient/caregiver today.   ROS  Ten systems reviewed and is negative except as mentioned in HPI   Objective  Vitals:   10/25/21 1131  BP: 130/82  Pulse: 100  Resp: 16  Temp: 98.1 F (36.7 C)  SpO2: 99%  Weight: 147 lb (66.7 kg)  Height: 5' 3" (1.6 m)    Body mass index is 26.04 kg/m.  Physical Exam  Constitutional: Patient appears well-developed and well-nourished. No distress.  HEENT: head atraumatic, normocephalic, pupils equal and reactive to light,  neck supple Cardiovascular: Normal rate,  regular rhythm and normal heart sounds.  No murmur heard. No BLE edema. Pulmonary/Chest: Effort normal and breath sounds normal. No respiratory distress. Abdominal: Soft.  There is no tenderness. Psychiatric: Patient has a normal mood and affect. behavior is normal. Judgment and thought content normal.   Recent Results (from the past 2160 hour(s))  Lipid panel     Status: None   Collection Time: 08/10/21 10:48 AM  Result Value Ref Range   Cholesterol 169 <200 mg/dL   HDL 56 > OR = 50 mg/dL   Triglycerides 77 <150 mg/dL   LDL Cholesterol (Calc) 96 mg/dL (calc)    Comment: Reference range: <100 . Desirable range <100 mg/dL for primary prevention;   <70 mg/dL for patients with CHD or diabetic patients  with > or = 2 CHD risk factors. Marland Kitchen LDL-C is now calculated using the Martin-Hopkins  calculation, which is a validated novel method providing  better accuracy than the Friedewald equation in the  estimation of LDL-C.  Cresenciano Genre et al. Annamaria Helling. 2409;735(32): 2061-2068  (http://education.[facility].com/faq/FAQ164)    Total CHOL/HDL Ratio 3.0 <5.0 (calc)   Non-HDL Cholesterol (Calc) 113 <130 mg/dL (calc)    Comment: For patients with diabetes plus 1 major ASCVD risk  factor, treating to a non-HDL-C goal of <100 mg/dL  (LDL-C of <70 mg/dL) is considered a therapeutic  option.   CBC with Differential/Platelet     Status: Abnormal   Collection Time: 08/10/21 10:48 AM  Result Value Ref Range   WBC 8.6 3.8 - 10.8 Thousand/uL   RBC 4.56 3.80 - 5.10 Million/uL   Hemoglobin 12.7 11.7 - 15.5 g/dL   HCT 40.1 35.0 - 45.0 %   MCV 87.9 80.0 - 100.0 fL   MCH 27.9 27.0 - 33.0 pg   MCHC 31.7 (L) 32.0 - 36.0 g/dL   RDW 13.7 11.0 - 15.0 %   Platelets 364 140 - 400 Thousand/uL   MPV 10.5 7.5 - 12.5 fL   Neutro Abs 4,223 1,500 - 7,800 cells/uL   Lymphs Abs 3,311 850 - 3,900 cells/uL   Absolute Monocytes 611 200 - 950 cells/uL   Eosinophils Absolute 413 15 - 500 cells/uL   Basophils Absolute  43 0 - 200 cells/uL   Neutrophils Relative % 49.1 %   Total Lymphocyte 38.5 %   Monocytes Relative 7.1 %   Eosinophils Relative 4.8 %   Basophils Relative 0.5 %  COMPLETE METABOLIC PANEL WITH GFR     Status: None   Collection Time: 08/10/21 10:48 AM  Result Value Ref Range   Glucose, Bld 88 65 - 99 mg/dL    Comment: .            Fasting reference interval .    BUN 12 7 - 25 mg/dL   Creat 0.58 0.50 - 1.03 mg/dL   eGFR 105 > OR = 60 mL/min/1.54m    Comment: The eGFR is based  on the CKD-EPI 2021 equation. To calculate  the new eGFR from a previous Creatinine or Cystatin C result, go to https://www.kidney.org/professionals/ kdoqi/gfr%5Fcalculator    BUN/Creatinine Ratio NOT APPLICABLE 6 - 22 (calc)   Sodium 142 135 - 146 mmol/L   Potassium 5.0 3.5 - 5.3 mmol/L   Chloride 106 98 - 110 mmol/L   CO2 28 20 - 32 mmol/L   Calcium 9.4 8.6 - 10.4 mg/dL   Total Protein 6.8 6.1 - 8.1 g/dL   Albumin 4.0 3.6 - 5.1 g/dL   Globulin 2.8 1.9 - 3.7 g/dL (calc)   AG Ratio 1.4 1.0 - 2.5 (calc)   Total Bilirubin 0.3 0.2 - 1.2 mg/dL   Alkaline phosphatase (APISO) 81 37 - 153 U/L   AST 14 10 - 35 U/L   ALT 12 6 - 29 U/L  Hemoglobin A1c     Status: Abnormal   Collection Time: 08/10/21 10:48 AM  Result Value Ref Range   Hgb A1c MFr Bld 5.7 (H) <5.7 % of total Hgb    Comment: For someone without known diabetes, a hemoglobin  A1c value between 5.7% and 6.4% is consistent with prediabetes and should be confirmed with a  follow-up test. . For someone with known diabetes, a value <7% indicates that their diabetes is well controlled. A1c targets should be individualized based on duration of diabetes, age, comorbid conditions, and other considerations. . This assay result is consistent with an increased risk of diabetes. . Currently, no consensus exists regarding use of hemoglobin A1c for diagnosis of diabetes for children. .    Mean Plasma Glucose 117 mg/dL   eAG (mmol/L) 6.5 mmol/L   QuantiFERON-TB Gold Plus     Status: None   Collection Time: 08/10/21 10:48 AM  Result Value Ref Range   QuantiFERON-TB Gold Plus NEGATIVE NEGATIVE    Comment: Negative test result. M. tuberculosis complex  infection unlikely.    NIL 0.05 IU/mL   Mitogen-NIL >10.00 IU/mL   TB1-NIL 0.00 IU/mL   TB2-NIL 0.00 IU/mL    Comment: . The Nil tube value reflects the background interferon gamma immune response of the patient's blood sample. This value has been subtracted from the patient's displayed TB and Mitogen results. . Lower than expected results with the Mitogen tube prevent false-negative Quantiferon readings by detecting a patient with a potential immune suppressive condition and/or suboptimal pre-analytical specimen handling. . The TB1 Antigen tube is coated with the M. tuberculosis-specific antigens designed to elicit responses from TB antigen primed CD4+ helper T-lymphocytes. . The TB2 Antigen tube is coated with the M. tuberculosis-specific antigens designed to elicit responses from TB antigen primed CD4+ helper and CD8+ cytotoxic T-lymphocytes. . For additional information, please refer to https://education.[facility].com/faq/FAQ204 (This link is being provided for informational/ educational purposes only.) .   Cytology - PAP     Status: None   Collection Time: 09/20/21  1:53 PM  Result Value Ref Range   Adequacy      Satisfactory for evaluation; transformation zone component PRESENT.   Diagnosis      - Negative for intraepithelial lesion or malignancy (NILM)     PHQ2/9: Depression screen [facility] 2/9 10/25/2021 09/09/2021 08/10/2021 03/07/2021 09/10/2020  Decreased Interest 0 0 0 0 0  Down, Depressed, Hopeless 0 0 0 0 0  PHQ - 2 Score 0 0 0 0 0  Altered sleeping 0 0 - - -  Tired, decreased energy 0 0 - - -  Change in appetite 0 0 - - -  Feeling bad or failure about yourself  0 0 - - -  Trouble concentrating 0 0 - - -  Moving slowly or fidgety/restless 0 0  - - -  Suicidal thoughts 0 0 - - -  PHQ-9 Score 0 0 - - -  Difficult doing work/chores - - - - -  Some recent data might be hidden    phq 9 is negative   Fall Risk: Fall Risk  10/25/2021 09/09/2021 08/10/2021 03/07/2021 09/10/2020  Falls in the past year? 0 0 0 0 0  Number falls in past yr: 0 0 0 0 0  Injury with Fall? 0 0 0 0 0  Comment - - - - -  Risk for fall due to : No Fall Risks No Fall Risks No Fall Risks - -  Follow up Falls prevention discussed Falls prevention discussed Falls prevention discussed - Falls evaluation completed      Functional Status Survey: Is the patient deaf or have difficulty hearing?: No Does the patient have difficulty seeing, even when wearing glasses/contacts?: No Does the patient have difficulty concentrating, remembering, or making decisions?: No Does the patient have difficulty walking or climbing stairs?: No Does the patient have difficulty dressing or bathing?: No Does the patient have difficulty doing errands alone such as visiting a doctor's office or shopping?: No    Assessment & Plan  1. Nasal congestion  - fluticasone (FLONASE) 50 MCG/ACT nasal spray; Place 2 sprays into both nostrils daily.  Dispense: 16 g; Refill: 0 - loratadine (CLARITIN) 10 MG tablet; Take 1 tablet (10 mg total) by mouth daily.  Dispense: 30 tablet; Refill: 0  2. Cervical radicular pain  - pregabalin (LYRICA) 25 MG capsule; Take 1-4 capsules (25-100 mg total) by mouth at bedtime. Go up every 3 days to max of 4 per night  Dispense: 100 capsule; Refill: 0

## 2021-10-25 ENCOUNTER — Other Ambulatory Visit: Payer: Self-pay

## 2021-10-25 ENCOUNTER — Encounter: Payer: Self-pay | Admitting: Family Medicine

## 2021-10-25 ENCOUNTER — Ambulatory Visit (INDEPENDENT_AMBULATORY_CARE_PROVIDER_SITE_OTHER): Payer: BC Managed Care – PPO | Admitting: Family Medicine

## 2021-10-25 VITALS — BP 130/82 | HR 100 | Temp 98.1°F | Resp 16 | Ht 63.0 in | Wt 147.0 lb

## 2021-10-25 DIAGNOSIS — R0981 Nasal congestion: Secondary | ICD-10-CM | POA: Diagnosis not present

## 2021-10-25 DIAGNOSIS — M5412 Radiculopathy, cervical region: Secondary | ICD-10-CM

## 2021-10-25 MED ORDER — FLUTICASONE PROPIONATE 50 MCG/ACT NA SUSP
2.0000 | Freq: Every day | NASAL | 0 refills | Status: DC
Start: 2021-10-25 — End: 2022-06-02
  Filled 2021-10-25: qty 16, 30d supply, fill #0

## 2021-10-25 MED ORDER — PREGABALIN 25 MG PO CAPS
25.0000 mg | ORAL_CAPSULE | Freq: Every day | ORAL | 0 refills | Status: DC
Start: 2021-10-25 — End: 2021-12-23
  Filled 2021-10-25: qty 100, 25d supply, fill #0

## 2021-10-25 MED ORDER — LORATADINE 10 MG PO TABS
10.0000 mg | ORAL_TABLET | Freq: Every day | ORAL | 0 refills | Status: DC
  Filled 2021-10-25 – 2022-04-24 (×2): qty 30, 30d supply, fill #0

## 2021-11-03 ENCOUNTER — Other Ambulatory Visit: Payer: Self-pay

## 2021-11-03 ENCOUNTER — Ambulatory Visit (INDEPENDENT_AMBULATORY_CARE_PROVIDER_SITE_OTHER): Payer: BC Managed Care – PPO | Admitting: Obstetrics and Gynecology

## 2021-11-03 ENCOUNTER — Encounter: Payer: Self-pay | Admitting: Obstetrics and Gynecology

## 2021-11-03 VITALS — BP 125/64 | HR 75 | Resp 16 | Ht 63.0 in | Wt 147.9 lb

## 2021-11-03 DIAGNOSIS — N951 Menopausal and female climacteric states: Secondary | ICD-10-CM

## 2021-11-03 NOTE — Progress Notes (Signed)
° ° °  GYNECOLOGY PROGRESS NOTE  Subjective:    Patient ID: Makayla Mason, female    DOB: May 04, 1966, 55 y.o.   MRN: [id number]  HPI  Patient is a 55 y.o. G52P2002 female who presents for discussion of menopausal symptoms.  States that she feels like her head and feet are "on fire".  Has multiple hot flushes throughout the day.  Had issues with menopausal symptoms several years ago but notes that they were manageable and then eventually resolved but now have returned. Also notes that family members have noted a change in her mood.   The following portions of the patient's history were reviewed and updated as appropriate: allergies, current medications, past family history, past medical history, past social history, past surgical history, and problem list.  Review of Systems Pertinent items noted in HPI and remainder of comprehensive ROS otherwise negative.   Objective:   Blood pressure 125/64, pulse 75, resp. rate 16, height 5\' 3"  (1.6 m), weight 147 lb 14.4 oz (67.1 kg).  Body mass index is 26.2 kg/m. General appearance: alert and no distress Remainder of exam deferred.    Assessment:   1. Menopausal vasomotor syndrome     Plan:   1. Menopausal vasomotor syndrome - Discussed option of natural remedies vs HRT vs non-hormonal prescription medications. Discussed risks/beneftis of each method.  Patient ok to try hormonal regimen. Given samples of Premarin tablets for 10 days, if prescription desired, will prescribe Prempro as patient retains her uterus.  To notify provider at completion of samples if prescription is desired. If no improvement, can schedule f/u visit to revisit other treatment options.    Rubie Maid, MD [facility]

## 2021-11-04 ENCOUNTER — Encounter: Payer: Self-pay | Admitting: Obstetrics and Gynecology

## 2021-11-04 ENCOUNTER — Telehealth: Payer: Self-pay | Admitting: Obstetrics and Gynecology

## 2021-11-04 LAB — PROGESTERONE: Progesterone: <0.1 ng/mL

## 2021-11-04 LAB — ESTRADIOL: Estradiol: <5 pg/mL

## 2021-11-04 NOTE — Telephone Encounter (Signed)
Pt called asking for a call back to review recent test results. Please Advise.

## 2021-11-04 NOTE — Telephone Encounter (Signed)
LM w. pt

## 2021-11-14 NOTE — Telephone Encounter (Signed)
Patient has been advised of lab results via mychart.

## 2021-12-06 ENCOUNTER — Ambulatory Visit: Payer: BC Managed Care – PPO | Admitting: Family Medicine

## 2021-12-06 ENCOUNTER — Other Ambulatory Visit: Payer: Self-pay

## 2021-12-06 DIAGNOSIS — J301 Allergic rhinitis due to pollen: Secondary | ICD-10-CM | POA: Diagnosis not present

## 2021-12-06 DIAGNOSIS — J019 Acute sinusitis, unspecified: Secondary | ICD-10-CM | POA: Diagnosis not present

## 2021-12-06 MED ORDER — AMOXICILLIN-POT CLAVULANATE 875-125 MG PO TABS
ORAL_TABLET | ORAL | 0 refills | Status: DC
  Filled 2021-12-06: qty 20, 10d supply, fill #0

## 2021-12-06 MED ORDER — MONTELUKAST SODIUM 10 MG PO TABS
10.0000 mg | ORAL_TABLET | Freq: Every day | ORAL | 3 refills | Status: DC
  Filled 2021-12-06: qty 90, 90d supply, fill #0

## 2021-12-06 MED ORDER — TRIAMCINOLONE ACETONIDE 55 MCG/ACT NA AERO
INHALATION_SPRAY | NASAL | 11 refills | Status: DC
  Filled 2021-12-06: qty 16.9, 30d supply, fill #0

## 2021-12-07 ENCOUNTER — Other Ambulatory Visit: Payer: Self-pay

## 2021-12-15 ENCOUNTER — Encounter: Payer: BC Managed Care – PPO | Admitting: Obstetrics and Gynecology

## 2021-12-22 NOTE — Progress Notes (Addendum)
Name: Makayla Mason   MRN: [id number]    DOB: 11/22/1965   Date:12/23/2021       Progress Note  Subjective  Chief Complaint  Follow Up  HPI  Paresthesia: she continues to have a burning sensation on right side of neck and scapular area when she showers, also has burning sensation on her face when she washes it, symptoms started in Dec 22. No weakness or numbness anywhere else. Discussed MS. She came in today and states a physical therapist that  goes by the nursing home discussed pinched nerve, explained she has a long history of cervical radiculitis but the burning / numbness on her face is unrelated, she states burning on the face can happen any time now . She went to GYN for vasomotor symptoms and was given estrogen supplementation but she is afraid to take it. She continues to refuse MRI of brain and c-spine  She never took Lyrica   Patient Active Problem List   Diagnosis Date Noted   Sprain of ulnar collateral ligament of metacarpophalangeal (MCP) joint of right thumb 06/08/2020   Cervical radicular pain 07/15/2018   Overweight (BMI 25.0-29.9) 01/29/2018   Right hip pain 01/29/2018   Insulin resistance 02/28/2017   Low grade squamous intraepith lesion on cytologic smear cervix (lgsil) 02/28/2017   GERD without esophagitis 03/15/2016   Left ankle pain 03/15/2016   Allergic rhinitis, seasonal 05/21/2015   Scoliosis 05/21/2015   Arthralgia of temporomandibular joint 05/21/2015    Past Surgical History:  Procedure Laterality Date   COLONOSCOPY WITH PROPOFOL N/A 02/15/2018   Procedure: COLONOSCOPY WITH PROPOFOL;  Surgeon: Jonathon Bellows, MD;  Location: [facility] ENDOSCOPY;  Service: Gastroenterology;  Laterality: N/A;    Family History  Problem Relation Age of Onset   Breast cancer Cousin    Diabetes Mother    Heart murmur Mother    Diabetes Father    Hypertension Father    Asthma Daughter    Anemia Maternal Aunt    Stroke Maternal Aunt    Kidney disease Maternal Grandmother     Hypertension Maternal Grandmother    Heart failure Maternal Grandmother    Heart attack Maternal Grandfather     Social History   Tobacco Use   Smoking status: Never   Smokeless tobacco: Never  Substance Use Topics   Alcohol use: Yes    Alcohol/week: 1.0 standard drink    Types: 1 Glasses of wine per week     Current Outpatient Medications:    fluticasone (FLONASE) 50 MCG/ACT nasal spray, Place 2 sprays into both nostrils daily., Disp: 16 g, Rfl: 0   loratadine (CLARITIN) 10 MG tablet, Take 1 tablet (10 mg total) by mouth daily., Disp: 30 tablet, Rfl: 0   montelukast (SINGULAIR) 10 MG tablet, TAKE 1 TABLET BY MOUTH AT BEDTIME., Disp: 90 tablet, Rfl: 1   montelukast (SINGULAIR) 10 MG tablet, Take one tablet daily., Disp: 90 tablet, Rfl: 3   omeprazole (PRILOSEC) 40 MG capsule, TAKE 1 CAPSULE BY MOUTH DAILY., Disp: 90 capsule, Rfl: 1   triamcinolone (NASACORT) 55 MCG/ACT AERO nasal inhaler, 2 spray in each nostril daily, Disp: 16.9 mL, Rfl: 11   clobetasol (TEMOVATE) 0.05 % external solution, 1(one) application(s) topical 2(two) times a day (Patient not taking: Reported on 12/23/2021), Disp: 50 mL, Rfl: 2   hydrocortisone 2.5 % ointment, 1(one) application(s) topical every day for burning of lips (Patient not taking: Reported on 12/23/2021), Disp: 28.35 g, Rfl: 0  Allergies  Allergen Reactions   Codeine  Nausea And Vomiting    I personally reviewed active problem list, medication list, allergies, family history, social history, health maintenance with the patient/caregiver today.   ROS  Ten systems reviewed and is negative except as mentioned in HPI   Objective  Vitals:   12/23/21 0943  BP: 112/68  Pulse: 91  Resp: 16  SpO2: 99%  Weight: 145 lb (65.8 kg)  Height: 5\' 3"  (1.6 m)    Body mass index is 25.69 kg/m.  Physical Exam  Constitutional: Patient appears well-developed and well-nourished.  No distress.  HEENT: head atraumatic, normocephalic, pupils equal and  reactive to light, neck supple Cardiovascular: Normal rate, regular rhythm and normal heart sounds.  No murmur heard. No BLE edema. Pulmonary/Chest: Effort normal and breath sounds normal. No respiratory distress. Abdominal: Soft.  There is no tenderness. Psychiatric: Patient has a normal mood and affect. behavior is normal. Judgment and thought content normal.  Neurological exam: normal balanced, cranial nerves, sensation, romberg negative   Recent Results (from the past 2160 hour(s))  Estradiol     Status: None   Collection Time: 11/03/21 11:24 AM  Result Value Ref Range   Estradiol <5.0 pg/mL    Comment:                     Adult Female:                       Follicular phase   46.6 -   166.0                       Ovulation phase    85.8 -   498.0                       Luteal phase       43.8 -   211.0                       Postmenopausal     <6.0 -    54.7                     Pregnancy                       1st trimester     215.0 - >4300.0 Roche ECLIA methodology   Progesterone     Status: None   Collection Time: 11/03/21 11:24 AM  Result Value Ref Range   Progesterone <0.1 ng/mL    Comment:                      Follicular phase       0.1 -   0.9                      Luteal phase           1.8 -  23.9                      Ovulation phase        0.1 -  12.0                      Pregnant                         First trimester  11.0 -  44.3                         Second trimester   25.4 -  83.3                         Third trimester    58.7 - 214.0                      Postmenopausal         0.0 -   0.1     PHQ2/9: Depression screen [facility] 2/9 12/23/2021 11/03/2021 10/25/2021 09/09/2021 08/10/2021  Decreased Interest 0 0 0 0 0  Down, Depressed, Hopeless 0 0 0 0 0  PHQ - 2 Score 0 0 0 0 0  Altered sleeping 0 - 0 0 -  Tired, decreased energy 0 - 0 0 -  Change in appetite 0 - 0 0 -  Feeling bad or failure about yourself  0 - 0 0 -  Trouble concentrating 0 - 0 0 -  Moving  slowly or fidgety/restless 0 - 0 0 -  Suicidal thoughts 0 - 0 0 -  PHQ-9 Score 0 - 0 0 -  Difficult doing work/chores - - - - -  Some recent data might be hidden    phq 9 is negative   Fall Risk: Fall Risk  12/23/2021 11/03/2021 10/25/2021 09/09/2021 08/10/2021  Falls in the past year? 0 0 0 0 0  Number falls in past yr: 0 0 0 0 0  Injury with Fall? 0 0 0 0 0  Comment - - - - -  Risk for fall due to : No Fall Risks - No Fall Risks No Fall Risks No Fall Risks  Follow up Falls prevention discussed - Falls prevention discussed Falls prevention discussed Falls prevention discussed      Functional Status Survey: Is the patient deaf or have difficulty hearing?: No Does the patient have difficulty seeing, even when wearing glasses/contacts?: No Does the patient have difficulty concentrating, remembering, or making decisions?: No Does the patient have difficulty walking or climbing stairs?: No Does the patient have difficulty dressing or bathing?: No Does the patient have difficulty doing errands alone such as visiting a doctor's office or shopping?: No    Assessment & Plan  1. Cervical radicular pain   2. Paresthesia  She wants to hold off on studies    3. Menopausal vasomotor syndrome  She will try the medication given by Dr. Marcelline Mates

## 2021-12-23 ENCOUNTER — Other Ambulatory Visit: Payer: Self-pay

## 2021-12-23 ENCOUNTER — Ambulatory Visit (INDEPENDENT_AMBULATORY_CARE_PROVIDER_SITE_OTHER): Payer: BC Managed Care – PPO | Admitting: Family Medicine

## 2021-12-23 ENCOUNTER — Encounter: Payer: Self-pay | Admitting: Family Medicine

## 2021-12-23 ENCOUNTER — Telehealth: Payer: Self-pay

## 2021-12-23 VITALS — BP 112/68 | HR 91 | Resp 16 | Ht 63.0 in | Wt 145.0 lb

## 2021-12-23 DIAGNOSIS — N951 Menopausal and female climacteric states: Secondary | ICD-10-CM | POA: Diagnosis not present

## 2021-12-23 DIAGNOSIS — M5412 Radiculopathy, cervical region: Secondary | ICD-10-CM

## 2021-12-23 DIAGNOSIS — R202 Paresthesia of skin: Secondary | ICD-10-CM

## 2021-12-23 NOTE — Telephone Encounter (Signed)
Patient called she is just now starting on her Premarin. She started taking the 0.625 mg first and then she will take the 0.3. She could not remember the instructions for this. I told her per your notes to take medication for 10 days and if she wants to continue call and let us know. She has concerns about her levels and what the ranges mean and how she can improve the levels.

## 2021-12-26 ENCOUNTER — Other Ambulatory Visit: Payer: Self-pay

## 2021-12-26 MED ORDER — PREMPRO 0.625-2.5 MG PO TABS
1.0000 | ORAL_TABLET | Freq: Every day | ORAL | 3 refills | Status: DC
  Filled 2021-12-26: qty 84, 84d supply, fill #0

## 2021-12-26 NOTE — Telephone Encounter (Signed)
She was supposed to begin with the 0.3, then move up to the 0.625 if her symptoms were not controlled.  If she felt like either of these doses helped her symptoms she was supposed to call back for a prescription. Her levels won't change much just being on the medication for a few days. We reviewed her labs last time which just noted that she was in menopause.  These levels really won't change much without intervention, but intervention is only needed if she is having symptoms.

## 2021-12-27 ENCOUNTER — Other Ambulatory Visit: Payer: Self-pay

## 2021-12-28 ENCOUNTER — Encounter: Payer: BC Managed Care – PPO | Admitting: Obstetrics and Gynecology

## 2021-12-28 ENCOUNTER — Other Ambulatory Visit: Payer: Self-pay

## 2021-12-28 MED ORDER — NORETHINDRONE-ETH ESTRADIOL 1-5 MG-MCG PO TABS
1.0000 | ORAL_TABLET | Freq: Every day | ORAL | 3 refills | Status: DC
  Filled 2021-12-28: qty 90, 90d supply, fill #0
  Filled 2022-01-11: qty 30, 30d supply, fill #0

## 2021-12-29 ENCOUNTER — Other Ambulatory Visit: Payer: Self-pay

## 2022-01-06 ENCOUNTER — Other Ambulatory Visit: Payer: Self-pay

## 2022-01-10 NOTE — Progress Notes (Signed)
? ? ?  GYNECOLOGY PROGRESS NOTE ? ?Subjective:  ? ? Patient ID: Makayla Mason, female    DOB: 1966/10/27, 56 y.o.   MRN: [id number] ? ?HPI ? Patient is a 56 y.o. G66P2002 female who presents for follow up on menopausal symptoms. She stopped taking medication samples (Premarin) that were given after the 8th tablet. She said she started experiencing some mild chest pain after taking the medication. Chest pain is has been intermittent, has decreased in intensity for the past for 4 days. She continues to have facial burning/pain. ? ?The following portions of the patient's history were reviewed and updated as appropriate: allergies, current medications, past family history, past medical history, past social history, past surgical history, and problem list. ? ?Review of Systems ?Pertinent items noted in HPI and remainder of comprehensive ROS otherwise negative.  ? ?Objective:  ? Blood pressure 123/78, pulse 81, resp. rate 16, height '5\' 3"'$  (1.6 m), weight 145 lb 6.4 oz (66 kg). Body mass index is 25.76 kg/m?. ?General appearance: alert and no distress ?Remainder of exam deferred.  ? ? ?Assessment:  ? ?1. Menopausal vasomotor syndrome   ?2. History of chest pain at rest   ?  ? ?Plan:  ? ?1. Menopausal vasomotor syndrome ?Discussed other options for management of menopausal symptoms, including herbal supplements, non-hormonal prescription medications (I.e. Paxil, Effexor, Gabapentin, Clonidine). Lengthy discussion had on pros and cons of each option.  Patient desires to try both.  Will prescribe  Gabpentin (may also help with facial tingling). Given list of herbal supplements.  ? ?2. History of chest pain at rest ?- Advised that if chest pain continues despite discontinuation of medication, would recommend f/u with her PCP or Cardiology.  Patient notes negative EKG ~ 2 years ago. Does have a previous h/o chest pain at rest.  Also with family history of cardiovascular disease.  ? ? ?A total of 22 minutes were spent face-to-face  with the patient during this encounter and over half of that time involved counseling and coordination of care. ? ? ? ?Rubie Maid, MD ?[facility] ? ?

## 2022-01-11 ENCOUNTER — Encounter: Payer: Self-pay | Admitting: Obstetrics and Gynecology

## 2022-01-11 ENCOUNTER — Ambulatory Visit: Payer: BC Managed Care – PPO | Admitting: Obstetrics and Gynecology

## 2022-01-11 ENCOUNTER — Other Ambulatory Visit: Payer: Self-pay

## 2022-01-11 VITALS — BP 123/78 | HR 81 | Resp 16 | Ht 63.0 in | Wt 145.4 lb

## 2022-01-11 DIAGNOSIS — N951 Menopausal and female climacteric states: Secondary | ICD-10-CM

## 2022-01-11 DIAGNOSIS — Z87898 Personal history of other specified conditions: Secondary | ICD-10-CM

## 2022-01-11 MED ORDER — GABAPENTIN 600 MG PO TABS
600.0000 mg | ORAL_TABLET | Freq: Every day | ORAL | 0 refills | Status: DC
  Filled 2022-01-11: qty 30, 30d supply, fill #0

## 2022-01-19 ENCOUNTER — Other Ambulatory Visit: Payer: Self-pay

## 2022-01-31 DIAGNOSIS — J301 Allergic rhinitis due to pollen: Secondary | ICD-10-CM | POA: Diagnosis not present

## 2022-02-01 ENCOUNTER — Other Ambulatory Visit: Payer: Self-pay

## 2022-02-01 MED ORDER — EPINEPHRINE 0.3 MG/0.3ML IJ SOAJ
INTRAMUSCULAR | 0 refills | Status: AC
  Filled 2022-02-01: qty 2, 30d supply, fill #0

## 2022-02-02 DIAGNOSIS — J301 Allergic rhinitis due to pollen: Secondary | ICD-10-CM | POA: Diagnosis not present

## 2022-02-06 ENCOUNTER — Other Ambulatory Visit: Payer: Self-pay

## 2022-02-06 DIAGNOSIS — J301 Allergic rhinitis due to pollen: Secondary | ICD-10-CM | POA: Diagnosis not present

## 2022-02-09 DIAGNOSIS — J301 Allergic rhinitis due to pollen: Secondary | ICD-10-CM | POA: Diagnosis not present

## 2022-02-13 DIAGNOSIS — J301 Allergic rhinitis due to pollen: Secondary | ICD-10-CM | POA: Diagnosis not present

## 2022-02-14 ENCOUNTER — Other Ambulatory Visit: Payer: Self-pay

## 2022-02-16 DIAGNOSIS — J301 Allergic rhinitis due to pollen: Secondary | ICD-10-CM | POA: Diagnosis not present

## 2022-02-20 DIAGNOSIS — J301 Allergic rhinitis due to pollen: Secondary | ICD-10-CM | POA: Diagnosis not present

## 2022-02-23 DIAGNOSIS — J301 Allergic rhinitis due to pollen: Secondary | ICD-10-CM | POA: Diagnosis not present

## 2022-02-27 DIAGNOSIS — J301 Allergic rhinitis due to pollen: Secondary | ICD-10-CM | POA: Diagnosis not present

## 2022-03-02 DIAGNOSIS — J301 Allergic rhinitis due to pollen: Secondary | ICD-10-CM | POA: Diagnosis not present

## 2022-03-06 DIAGNOSIS — J301 Allergic rhinitis due to pollen: Secondary | ICD-10-CM | POA: Diagnosis not present

## 2022-03-07 ENCOUNTER — Telehealth: Payer: Self-pay

## 2022-03-07 NOTE — Telephone Encounter (Signed)
Patient states she called our office several times and left a message for call and no one has called her back. She called around 12:15 to 12:30 and she said we were close and she pressed the option for the operated to call the on call doctor. The operator page Dr. Marius Ditch and she did not get what she was calling about. Dr Marius Ditch asked we give her a call back. Called patient and left a message for call back. Gave patient my direct number to call  ?

## 2022-03-08 NOTE — Progress Notes (Deleted)
Name: [facility]   MRN: [id number]    DOB: May 01, 1966   Date:03/08/2022       Progress Note  Subjective  Chief Complaint  Follow up   HPI  Paresthesia: she continues to have a burning sensation on right side of neck and scapular area when she showers, also has burning sensation on her face when she washes it, symptoms started in Dec 22. No weakness or numbness anywhere else. Discussed MS. She came in today and states a physical therapist that  goes by the nursing home discussed pinched nerve, explained she has a long history of cervical radiculitis but the burning / numbness on her face is unrelated, she states burning on the face can happen any time now . She went to GYN for vasomotor symptoms and was given estrogen supplementation but she is afraid to take it. She continues to refuse MRI of brain and c-spine  She never took Lyrica   Patient Active Problem List   Diagnosis Date Noted   Sprain of ulnar collateral ligament of metacarpophalangeal (MCP) joint of right thumb 06/08/2020   Cervical radicular pain 07/15/2018   Overweight (BMI 25.0-29.9) 01/29/2018   Right hip pain 01/29/2018   Insulin resistance 02/28/2017   Low grade squamous intraepith lesion on cytologic smear cervix (lgsil) 02/28/2017   GERD without esophagitis 03/15/2016   Left ankle pain 03/15/2016   Allergic rhinitis, seasonal 05/21/2015   Scoliosis 05/21/2015   Arthralgia of temporomandibular joint 05/21/2015    Past Surgical History:  Procedure Laterality Date   COLONOSCOPY WITH PROPOFOL N/A 02/15/2018   Procedure: COLONOSCOPY WITH PROPOFOL;  Surgeon: Makayla Bellows, MD;  [facility];  Service: Gastroenterology;  Laterality: N/A;    Family History  Problem Relation Age of Onset   Breast cancer Cousin    Diabetes Mother    Heart murmur Mother    Diabetes Father    Hypertension Father    Asthma Daughter    Anemia Maternal Aunt    Stroke Maternal Aunt    Kidney disease Maternal Grandmother     Hypertension Maternal Grandmother    Heart failure Maternal Grandmother    Heart attack Maternal Grandfather     Social History   Tobacco Use   Smoking status: Never   Smokeless tobacco: Never  Substance Use Topics   Alcohol use: Yes    Alcohol/week: 1.0 standard drink    Types: 1 Glasses of wine per week     Current Outpatient Medications:    EPINEPHrine (EPIPEN 2-PAK) 0.3 mg/0.3 mL IJ SOAJ injection, USE AS DIRECTED, Disp: 2 each, Rfl: 0   fluticasone (FLONASE) 50 MCG/ACT nasal spray, Place 2 sprays into both nostrils daily., Disp: 16 g, Rfl: 0   gabapentin (NEURONTIN) 600 MG tablet, Take 1 tablet (600 mg total) by mouth at bedtime., Disp: 30 tablet, Rfl: 0   loratadine (CLARITIN) 10 MG tablet, Take 1 tablet (10 mg total) by mouth daily., Disp: 30 tablet, Rfl: 0   montelukast (SINGULAIR) 10 MG tablet, TAKE 1 TABLET BY MOUTH AT BEDTIME., Disp: 90 tablet, Rfl: 1   norethindrone-ethinyl estradiol (FEMHRT 1/5) 1-5 MG-MCG TABS tablet, Take 1 tablet by mouth daily. (Patient not taking: Reported on 01/11/2022), Disp: 90 tablet, Rfl: 3   omeprazole (PRILOSEC) 40 MG capsule, TAKE 1 CAPSULE BY MOUTH DAILY., Disp: 90 capsule, Rfl: 1   triamcinolone (NASACORT) 55 MCG/ACT AERO nasal inhaler, 2 spray in each nostril daily, Disp: 16.9 mL, Rfl: 11  Allergies  Allergen Reactions  Codeine Nausea And Vomiting    I personally reviewed {Reviewed:14835} with the patient/caregiver today.   ROS  ***  Objective  There were no vitals filed for this visit.  There is no height or weight on file to calculate BMI.  Physical Exam ***  No results found for this or any previous visit (from the past 2160 hour(s)).  Diabetic Foot Exam: Diabetic Foot Exam - Simple   No data filed    ***  PHQ2/9:    12/23/2021    9:42 AM 11/03/2021   12:38 PM 10/25/2021   11:31 AM 09/09/2021    8:56 AM 08/10/2021    9:59 AM  Depression screen PHQ 2/9  Decreased Interest 0 0 0 0 0  Down, Depressed,  Hopeless 0 0 0 0 0  PHQ - 2 Score 0 0 0 0 0  Altered sleeping 0  0 0   Tired, decreased energy 0  0 0   Change in appetite 0  0 0   Feeling bad or failure about yourself  0  0 0   Trouble concentrating 0  0 0   Moving slowly or fidgety/restless 0  0 0   Suicidal thoughts 0  0 0   PHQ-9 Score 0  0 0     phq 9 is {gen pos [id number]} ***  Fall Risk:    01/11/2022   10:21 AM 12/23/2021    9:42 AM 11/03/2021   12:38 PM 10/25/2021   11:31 AM 09/09/2021    8:56 AM  Fall Risk   Falls in the past year? 0 0 0 0 0  Number falls in past yr: 0 0 0 0 0  Injury with Fall? 0 0 0 0 0  Risk for fall due to :  No Fall Risks  No Fall Risks No Fall Risks  Follow up  Falls prevention discussed  Falls prevention discussed Falls prevention discussed   ***   Functional Status Survey:   ***   Assessment & Plan  *** There are no diagnoses linked to this encounter.

## 2022-03-09 ENCOUNTER — Ambulatory Visit: Payer: BC Managed Care – PPO | Admitting: Family Medicine

## 2022-03-09 DIAGNOSIS — J301 Allergic rhinitis due to pollen: Secondary | ICD-10-CM | POA: Diagnosis not present

## 2022-03-09 DIAGNOSIS — R7303 Prediabetes: Secondary | ICD-10-CM

## 2022-03-09 NOTE — Progress Notes (Signed)
? ? ?  GYNECOLOGY PROGRESS NOTE ? ?Subjective:  ? ? Patient ID: [facility], female    DOB: [birth year], 56 y.o.   MRN: [id number] ? ?HPI ? Patient is a 56 y.o. G74P2002 female who presents to discuss concerns about menopause. She continues to have facial burning. Someone told her that there was a cream she could use topically on her face to help with the burning. Also notes that she plans to go to the herbal store to purchase some OTC remedies.  ? ?The following portions of the patient's history were reviewed and updated as appropriate: allergies, current medications, past family history, past medical history, past social history, past surgical history, and problem list. ? ?Review of Systems ?Pertinent items noted in HPI and remainder of comprehensive ROS otherwise negative.  ? ?Objective:  ?Blood pressure 122/66, pulse 70, resp. rate 16, height '5\' 4"'$  (1.626 m), weight 147 lb 12.8 oz (67 kg).  Body mass index is 25.37 kg/m?. ?General appearance: alert, cooperative, and no distress ?Remainder of exam deferred.  ? ?Assessment:  ? ?1. Menopausal vasomotor syndrome   ?  ? ?Plan:  ? ?- Advised that I was unaware of any cream that could help with facial burning, but if patient knew name of cream, I could look into the ingredients.  ?- Discussed other natural remedies for menopausal syndrome including black cohosh, evening primrose oil, or use of Estroven OTC. Also could consider use of Clonidine for hot flushes/facial burning.  ? ?RTC if no improvement in symptoms.  ? ?Rubie Maid, MD ?[facility]  ?

## 2022-03-10 DIAGNOSIS — J301 Allergic rhinitis due to pollen: Secondary | ICD-10-CM | POA: Diagnosis not present

## 2022-03-13 ENCOUNTER — Other Ambulatory Visit: Payer: Self-pay

## 2022-03-13 DIAGNOSIS — J301 Allergic rhinitis due to pollen: Secondary | ICD-10-CM | POA: Diagnosis not present

## 2022-03-15 ENCOUNTER — Encounter: Payer: Self-pay | Admitting: Obstetrics and Gynecology

## 2022-03-15 ENCOUNTER — Ambulatory Visit: Payer: BC Managed Care – PPO | Admitting: Obstetrics and Gynecology

## 2022-03-15 VITALS — BP 122/66 | HR 70 | Resp 16 | Ht 64.0 in | Wt 147.8 lb

## 2022-03-15 DIAGNOSIS — N951 Menopausal and female climacteric states: Secondary | ICD-10-CM

## 2022-03-16 DIAGNOSIS — J301 Allergic rhinitis due to pollen: Secondary | ICD-10-CM | POA: Diagnosis not present

## 2022-03-20 DIAGNOSIS — J301 Allergic rhinitis due to pollen: Secondary | ICD-10-CM | POA: Diagnosis not present

## 2022-03-22 ENCOUNTER — Other Ambulatory Visit: Payer: Self-pay | Admitting: Family Medicine

## 2022-03-22 DIAGNOSIS — Z1231 Encounter for screening mammogram for malignant neoplasm of breast: Secondary | ICD-10-CM

## 2022-03-23 DIAGNOSIS — J301 Allergic rhinitis due to pollen: Secondary | ICD-10-CM | POA: Diagnosis not present

## 2022-03-24 ENCOUNTER — Ambulatory Visit: Payer: BC Managed Care – PPO | Admitting: Family Medicine

## 2022-03-27 DIAGNOSIS — J301 Allergic rhinitis due to pollen: Secondary | ICD-10-CM | POA: Diagnosis not present

## 2022-03-30 DIAGNOSIS — J301 Allergic rhinitis due to pollen: Secondary | ICD-10-CM | POA: Diagnosis not present

## 2022-04-06 DIAGNOSIS — J301 Allergic rhinitis due to pollen: Secondary | ICD-10-CM | POA: Diagnosis not present

## 2022-04-10 DIAGNOSIS — J301 Allergic rhinitis due to pollen: Secondary | ICD-10-CM | POA: Diagnosis not present

## 2022-04-13 DIAGNOSIS — J301 Allergic rhinitis due to pollen: Secondary | ICD-10-CM | POA: Diagnosis not present

## 2022-04-17 DIAGNOSIS — J301 Allergic rhinitis due to pollen: Secondary | ICD-10-CM | POA: Diagnosis not present

## 2022-04-20 DIAGNOSIS — J301 Allergic rhinitis due to pollen: Secondary | ICD-10-CM | POA: Diagnosis not present

## 2022-04-21 ENCOUNTER — Other Ambulatory Visit: Payer: Self-pay

## 2022-04-21 DIAGNOSIS — N951 Menopausal and female climacteric states: Secondary | ICD-10-CM | POA: Diagnosis not present

## 2022-04-21 DIAGNOSIS — R55 Syncope and collapse: Secondary | ICD-10-CM | POA: Diagnosis not present

## 2022-04-21 MED ORDER — COMBIPATCH 0.05-0.14 MG/DAY TD PTTW
MEDICATED_PATCH | TRANSDERMAL | 11 refills | Status: DC
  Filled 2022-04-21: qty 8, 28d supply, fill #0
  Filled 2022-05-29: qty 8, 28d supply, fill #1

## 2022-04-24 ENCOUNTER — Other Ambulatory Visit: Payer: Self-pay

## 2022-04-24 DIAGNOSIS — J301 Allergic rhinitis due to pollen: Secondary | ICD-10-CM | POA: Diagnosis not present

## 2022-04-25 ENCOUNTER — Other Ambulatory Visit: Payer: Self-pay

## 2022-04-28 ENCOUNTER — Other Ambulatory Visit: Payer: Self-pay

## 2022-05-04 ENCOUNTER — Ambulatory Visit: Payer: BC Managed Care – PPO | Admitting: Family Medicine

## 2022-05-04 DIAGNOSIS — J301 Allergic rhinitis due to pollen: Secondary | ICD-10-CM | POA: Diagnosis not present

## 2022-05-12 ENCOUNTER — Ambulatory Visit
Admission: RE | Admit: 2022-05-12 | Discharge: 2022-05-12 | Disposition: A | Discharge status: Home / Self Care | Payer: BC Managed Care – PPO | Source: Ambulatory Visit | Attending: Family Medicine | Admitting: Family Medicine

## 2022-05-12 DIAGNOSIS — Z1231 Encounter for screening mammogram for malignant neoplasm of breast: Secondary | ICD-10-CM | POA: Diagnosis not present

## 2022-05-15 DIAGNOSIS — J301 Allergic rhinitis due to pollen: Secondary | ICD-10-CM | POA: Diagnosis not present

## 2022-05-22 DIAGNOSIS — J301 Allergic rhinitis due to pollen: Secondary | ICD-10-CM | POA: Diagnosis not present

## 2022-05-29 ENCOUNTER — Other Ambulatory Visit: Payer: Self-pay

## 2022-05-29 DIAGNOSIS — J301 Allergic rhinitis due to pollen: Secondary | ICD-10-CM | POA: Diagnosis not present

## 2022-05-30 ENCOUNTER — Other Ambulatory Visit: Payer: Self-pay

## 2022-05-31 DIAGNOSIS — J301 Allergic rhinitis due to pollen: Secondary | ICD-10-CM | POA: Diagnosis not present

## 2022-06-01 ENCOUNTER — Other Ambulatory Visit: Payer: Self-pay

## 2022-06-01 NOTE — Progress Notes (Signed)
Name: [facility]   MRN: [id number]    DOB: 1966-03-28   Date:06/02/2022       Progress Note  Subjective  Chief Complaint  Follow Up  HPI  Paresthesia: she continues to have a burning sensation on right side of neck and scapular area when she showers, also has burning sensation on her face when she washes it, symptoms started in Dec 22. No weakness or numbness anywhere else. Discussed MS. She came in today and states a physical therapist that  goes by the nursing home discussed pinched nerve, explained she has a long history of cervical radiculitis but the burning / numbness on her face is unrelated, she states burning on the face can happen any time now .  She continues to refuse MRI of brain and c-spine.   Vasomotor symptoms : she states it has helped with paresthesia   Insulin Resistance: She denies polyphagia, polyuria or polyphagia.Last A1C was  down from 5.8 % to 5.7 %  she has been drinking more water, eating more fruit and avoiding sodas    Dyslipidemia:    The 10-year ASCVD risk score (Arnett DK, et al., 2019) is: 2.2%   Values used to calculate the score:     Age: 56 years     Sex: Female     Is Non-Hispanic African American: Yes     Diabetic: No     Tobacco smoker: No     Systolic Blood Pressure: 416 mmHg     Is BP treated: No     HDL Cholesterol: 56 mg/dL     Total Cholesterol: 169 mg/dL    GERD: she takes Omeprazole before dinner , and symptoms are controlled, no heartburn, indigestion  or regurgitation.   Perennial AR: she started allergy shots Fall 2019 with ENT, sees Dr. Ladene Artist , she took a pause on allergy shots since symptoms under control, she went back on allergy shots this Spring. 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 She is currently having nasal congestion, post-nasal drainage that is clear.She denies pruritis but eyes are watery.     TMJ: she is doing better since she started using a mouthguard . No pain at this time    OA right knee and hip: seen by Dr. Rudene Christians Dorise Hiss PA in the past and had X-rays, she states pain has been getting progressively worse, she went to [facility] with severe pain back in 06/2020 , negative for DVT, CT angio negative, elevated WBC. We referred her to [place] but she did not want to go to [place]. Eventually symptoms resolved    Patient Active Problem List   Diagnosis Date Noted   Sprain of ulnar collateral ligament of metacarpophalangeal (MCP) joint of right thumb 06/08/2020   Cervical radicular pain 07/15/2018   Overweight (BMI 25.0-29.9) 01/29/2018   Right hip pain 01/29/2018   Insulin resistance 02/28/2017   Low grade squamous intraepith lesion on cytologic smear cervix (lgsil) 02/28/2017   GERD without esophagitis 03/15/2016   Left ankle pain 03/15/2016   Allergic rhinitis, seasonal 05/21/2015   Scoliosis 05/21/2015   Arthralgia of temporomandibular joint 05/21/2015    Past Surgical History:  Procedure Laterality Date   COLONOSCOPY WITH PROPOFOL N/A 02/15/2018   Procedure: COLONOSCOPY WITH PROPOFOL;  Surgeon: Jonathon Bellows, MD;  Location: [facility] END[facility]COPY;  Service: Gastroenterology;  Laterality: N/A;  Family History  Problem Relation Age of Onset   Breast cancer Cousin    Diabetes Mother    Heart murmur Mother    Diabetes Father    Hypertension Father    Asthma Daughter    Anemia Maternal Aunt    Stroke Maternal Aunt    Kidney disease Maternal Grandmother    Hypertension Maternal Grandmother    Heart failure Maternal Grandmother    Heart attack Maternal Grandfather     Social History   Tobacco Use   Smoking status: Never   Smokeless tobacco: Never  Substance Use Topics   Alcohol use: Yes    Alcohol/week: 1.0 standard drink of alcohol    Types: 1 Glasses of wine per week     Current Outpatient Medications:    EPINEPHrine (EPIPEN 2-PAK) 0.3 mg/0.3 mL IJ  SOAJ injection, USE AS DIRECTED, Disp: 2 each, Rfl: 0   estradiol-norethindrone (COMBIPATCH) 0.05-0.14 MG/DAY, Place 1 patch onto the skin twice a week, Disp: 8 patch, Rfl: 11   loratadine (CLARITIN) 10 MG tablet, Take 1 tablet (10 mg total) by mouth daily., Disp: 30 tablet, Rfl: 0   montelukast (SINGULAIR) 10 MG tablet, TAKE 1 TABLET BY MOUTH AT BEDTIME., Disp: 90 tablet, Rfl: 1   omeprazole (PRILOSEC) 40 MG capsule, TAKE 1 CAPSULE BY MOUTH DAILY., Disp: 90 capsule, Rfl: 1   triamcinolone (NASACORT) 55 MCG/ACT AERO nasal inhaler, 2 spray in each nostril daily, Disp: 16.9 mL, Rfl: 11   norethindrone-ethinyl estradiol (FEMHRT 1/5) 1-5 MG-MCG TABS tablet, Take 1 tablet by mouth daily. (Patient not taking: Reported on 06/02/2022), Disp: 90 tablet, Rfl: 3  Allergies  Allergen Reactions   Codeine Nausea And Vomiting    I personally reviewed active problem list, medication list, allergies, family history, social history with the patient/caregiver today.   ROS  Constitutional: Negative for fever or weight change.  Respiratory: Negative for cough and shortness of breath.   Cardiovascular: Negative for chest pain or palpitations.  Gastrointestinal: Negative for abdominal pain, no bowel changes.  Musculoskeletal: Negative for gait problem or joint swelling.  Skin: Negative for rash.  Neurological: Negative for dizziness or headache.  No other specific complaints in a complete review of systems (except as listed in HPI above).   Objective  Vitals:   06/02/22 1057  BP: 116/72  Pulse: 93  Resp: 16  SpO2: 97%  Weight: 147 lb (66.7 kg)  Height: '5\' 3"'$  (1.6 m)    Body mass index is 26.04 kg/m.  Physical Exam  Constitutional: Patient appears well-developed and well-nourished.  No distress.  HEENT: head atraumatic, normocephalic, pupils equal and reactive to light, ears normal TM bilateral , boggy turbinates  neck supple, throat within normal limits Cardiovascular: Normal rate, regular  rhythm and normal heart sounds.  No murmur heard. No BLE edema. Pulmonary/Chest: Effort normal and breath sounds normal. No respiratory distress. Abdominal: Soft.  There is no tenderness. Psychiatric: Patient has a normal mood and affect. behavior is normal. Judgment and thought content normal.    PHQ2/9:    06/02/2022   10:57 AM 12/23/2021    9:42 AM 11/03/2021   12:38 PM 10/25/2021   11:31 AM 09/09/2021    8:56 AM  Depression screen PHQ 2/9  Decreased Interest 0 0 0 0 0  Down, Depressed, Hopeless 0 0 0 0 0  PHQ - 2 Score 0 0 0 0 0  Altered sleeping 0 0  0 0  Tired, decreased energy 0 0  0 0  Change in appetite 0 0  0 0  Feeling bad or failure about yourself  0 0  0 0  Trouble concentrating 0 0  0 0  Moving slowly or fidgety/restless 0 0  0 0  Suicidal thoughts 0 0  0 0  PHQ-9 Score 0 0  0 0    phq 9 is negative   Fall Risk:    06/02/2022   10:56 AM 03/15/2022    8:49 AM 01/11/2022   10:21 AM 12/23/2021    9:42 AM 11/03/2021   12:38 PM  Fall Risk   Falls in the past year? 0 0 0 0 0  Number falls in past yr: 0 0 0 0 0  Injury with Fall? 0 0 0 0 0  Risk for fall due to : No Fall Risks   No Fall Risks   Follow up Falls prevention discussed   Falls prevention discussed       Functional Status Survey: Is the patient deaf or have difficulty hearing?: No Does the patient have difficulty seeing, even when wearing glasses/contacts?: No Does the patient have difficulty concentrating, remembering, or making decisions?: No Does the patient have difficulty walking or climbing stairs?: No Does the patient have difficulty dressing or bathing?: No Does the patient have difficulty doing errands alone such as visiting a doctor's office or shopping?: No    Assessment & Plan  1. Paresthesia  Slightly better with estrogen patch   2. Menopausal vasomotor syndrome   3. GERD without esophagitis  Doing well   4. Perennial allergic rhinitis with seasonal variation  -  levocetirizine (XYZAL) 5 MG tablet; Take 1 tablet (5 mg total) by mouth every evening.  Dispense: 90 tablet; Refill: 1 - azelastine (ASTELIN) 0.1 % nasal spray; Place 2 sprays into both nostrils 2 (two) times daily. Use in each nostril as directed  Dispense: 30 mL; Refill: 2  5. Prediabetes  On life style modification  6. Dyslipidemia

## 2022-06-02 ENCOUNTER — Other Ambulatory Visit: Payer: Self-pay

## 2022-06-02 ENCOUNTER — Encounter: Payer: Self-pay | Admitting: Family Medicine

## 2022-06-02 ENCOUNTER — Ambulatory Visit (INDEPENDENT_AMBULATORY_CARE_PROVIDER_SITE_OTHER): Payer: BC Managed Care – PPO | Admitting: Family Medicine

## 2022-06-02 VITALS — BP 116/72 | HR 93 | Resp 16 | Ht 63.0 in | Wt 147.0 lb

## 2022-06-02 DIAGNOSIS — N951 Menopausal and female climacteric states: Secondary | ICD-10-CM | POA: Diagnosis not present

## 2022-06-02 DIAGNOSIS — R202 Paresthesia of skin: Secondary | ICD-10-CM | POA: Diagnosis not present

## 2022-06-02 DIAGNOSIS — J3089 Other allergic rhinitis: Secondary | ICD-10-CM | POA: Diagnosis not present

## 2022-06-02 DIAGNOSIS — K219 Gastro-esophageal reflux disease without esophagitis: Secondary | ICD-10-CM

## 2022-06-02 DIAGNOSIS — J302 Other seasonal allergic rhinitis: Secondary | ICD-10-CM

## 2022-06-02 DIAGNOSIS — E785 Hyperlipidemia, unspecified: Secondary | ICD-10-CM

## 2022-06-02 DIAGNOSIS — R7303 Prediabetes: Secondary | ICD-10-CM

## 2022-06-02 MED ORDER — AZELASTINE HCL 0.1 % NA SOLN
2.0000 | Freq: Two times a day (BID) | NASAL | 2 refills | Status: DC
  Filled 2022-06-02: qty 30, 30d supply, fill #0
  Filled 2022-09-05 – 2022-10-02 (×2): qty 30, 30d supply, fill #1

## 2022-06-02 MED ORDER — LEVOCETIRIZINE DIHYDROCHLORIDE 5 MG PO TABS
5.0000 mg | ORAL_TABLET | Freq: Every evening | ORAL | 1 refills | Status: DC
  Filled 2022-06-02: qty 90, 90d supply, fill #0
  Filled 2022-09-05: qty 90, 90d supply, fill #1

## 2022-06-05 DIAGNOSIS — J301 Allergic rhinitis due to pollen: Secondary | ICD-10-CM | POA: Diagnosis not present

## 2022-06-12 DIAGNOSIS — J301 Allergic rhinitis due to pollen: Secondary | ICD-10-CM | POA: Diagnosis not present

## 2022-06-19 DIAGNOSIS — J301 Allergic rhinitis due to pollen: Secondary | ICD-10-CM | POA: Diagnosis not present

## 2022-06-22 ENCOUNTER — Other Ambulatory Visit: Payer: Self-pay

## 2022-06-22 DIAGNOSIS — R55 Syncope and collapse: Secondary | ICD-10-CM | POA: Diagnosis not present

## 2022-06-22 MED ORDER — PREMPRO 0.625-5 MG PO TABS
1.0000 | ORAL_TABLET | Freq: Every day | ORAL | 11 refills | Status: AC
  Filled 2022-06-22 – 2022-12-08 (×2): qty 28, 28d supply, fill #0

## 2022-06-23 ENCOUNTER — Other Ambulatory Visit: Payer: Self-pay

## 2022-06-23 ENCOUNTER — Ambulatory Visit: Payer: BC Managed Care – PPO | Admitting: Family Medicine

## 2022-06-26 DIAGNOSIS — J301 Allergic rhinitis due to pollen: Secondary | ICD-10-CM | POA: Diagnosis not present

## 2022-07-03 DIAGNOSIS — J301 Allergic rhinitis due to pollen: Secondary | ICD-10-CM | POA: Diagnosis not present

## 2022-07-17 DIAGNOSIS — J301 Allergic rhinitis due to pollen: Secondary | ICD-10-CM | POA: Diagnosis not present

## 2022-07-20 ENCOUNTER — Ambulatory Visit: Payer: BC Managed Care – PPO | Admitting: Nurse Practitioner

## 2022-07-20 ENCOUNTER — Other Ambulatory Visit: Payer: Self-pay

## 2022-07-20 ENCOUNTER — Encounter: Payer: Self-pay | Admitting: Nurse Practitioner

## 2022-07-20 VITALS — BP 128/72 | HR 92 | Temp 98.3°F | Resp 18 | Ht 63.0 in | Wt 153.8 lb

## 2022-07-20 DIAGNOSIS — Z09 Encounter for follow-up examination after completed treatment for conditions other than malignant neoplasm: Secondary | ICD-10-CM

## 2022-07-20 DIAGNOSIS — L03012 Cellulitis of left finger: Secondary | ICD-10-CM

## 2022-07-20 DIAGNOSIS — R7303 Prediabetes: Secondary | ICD-10-CM

## 2022-07-20 DIAGNOSIS — Z23 Encounter for immunization: Secondary | ICD-10-CM | POA: Diagnosis not present

## 2022-07-20 LAB — GLUCOSE, POCT (MANUAL RESULT ENTRY): POC Glucose: 79 mg/dl (ref 70–99)

## 2022-07-20 MED ORDER — CEPHALEXIN 500 MG PO CAPS
500.0000 mg | ORAL_CAPSULE | Freq: Four times a day (QID) | ORAL | 0 refills | Status: AC
  Filled 2022-07-20: qty 20, 5d supply, fill #0

## 2022-07-20 NOTE — Progress Notes (Signed)
BP 128/72   Pulse 92   Temp 98.3 F (36.8 C) (Oral)   Resp 18   Ht '5\' 3"'$  (1.6 m)   Wt 153 lb 12.8 oz (69.8 kg)   SpO2 98%   BMI 27.24 kg/m    Subjective:    Patient ID: [facility], female    DOB: 1966-07-14, 56 y.o.   MRN: [id number]  HPI: [facility] is a 56 y.o. female  Chief Complaint  Patient presents with   Hand Pain    Left hand finger injury   Paronychia: Patient states that yesterday she noticed that her left middle finger with swelling.  Patient denies any trauma.  Patient states she does push back her cuticles.  She does have a paronychia in the left middle finger.  Discussed doing incision and drainage, obtaining a wound culture, soaking finger at least 4 times a day in warm water may use with Epsom salt.  We will start patient on Keflex.  Patient is agreeable to plan.  Diagnosis: paronychia- Location: left 3rd fingernail Procedure: Incision & drainage Informed consent:  Discussed risks (permanent loss of nail, permanent irregular growth of nail, infection, pain, bleeding, bruising, numbness, and recurrence of the condition) and benefits of the procedure, as well as the alternatives.  Informed consent was obtained. Anesthesia: none Type: none The area was prepared and draped in a standard fashion. The lesion drained blood. The patient tolerated the procedure well. The patient was instructed on post-op care.   Relevant past medical, surgical, family and social history reviewed and updated as indicated. Interim medical history since our last visit reviewed. Allergies and medications reviewed and updated.  Review of Systems  Constitutional: Negative for fever or weight change.  Respiratory: Negative for cough and shortness of breath.   Cardiovascular: Negative for chest pain or palpitations.  Gastrointestinal: Negative for abdominal pain, no bowel changes.  Musculoskeletal: Negative for gait problem or joint swelling.  Skin: Negative for rash.  Positive  swelling to left distal middle finger Neurological: Negative for dizziness or headache.  No other specific complaints in a complete review of systems (except as listed in HPI above).      Objective:    BP 128/72   Pulse 92   Temp 98.3 F (36.8 C) (Oral)   Resp 18   Ht '5\' 3"'$  (1.6 m)   Wt 153 lb 12.8 oz (69.8 kg)   SpO2 98%   BMI 27.24 kg/m   Wt Readings from Last 3 Encounters:  07/20/22 153 lb 12.8 oz (69.8 kg)  06/02/22 147 lb (66.7 kg)  03/15/22 147 lb 12.8 oz (67 kg)    Physical Exam  Constitutional: Patient appears well-developed and well-nourished. Obese  No distress.  HEENT: head atraumatic, normocephalic, pupils equal and reactive to light, neck supple Cardiovascular: Normal rate, regular rhythm and normal heart sounds.  No murmur heard. No BLE edema. Pulmonary/Chest: Effort normal and breath sounds normal. No respiratory distress. Abdominal: Soft.  There is no tenderness. Left middle finger: Swelling noted to left middle finger, paronychia present Psychiatric: Patient has a normal mood and affect. behavior is normal. Judgment and thought content normal.  Results for orders placed or performed in visit on 07/20/22  POCT Glucose (CBG)  Result Value Ref Range   POC Glucose 79 70 - 99 mg/dl      Assessment & Plan:   Problem List Items Addressed This Visit   None Visit Diagnoses     Paronychia of left middle  finger    -  Primary   I&D performed, culture obtained, start taking Keflex and soak finger at least 4 times a day in warm water with Epsom salt   Relevant Medications   cephALEXin (KEFLEX) 500 MG capsule   Other Relevant Orders   Wound culture   Encounter for recheck of abscess following incision and drainage       Relevant Medications   cephALEXin (KEFLEX) 500 MG capsule   Other Relevant Orders   Wound culture   Need for influenza vaccination       Prediabetes       Patient requested to have her glucose checked.  glucose was 79   Relevant Orders    POCT Glucose (CBG) (Completed)       Diagnosis: paronychia- Location: left 3rd fingernail Procedure: Incision & drainage Informed consent:  Discussed risks (permanent loss of nail, permanent irregular growth of nail, infection, pain, bleeding, bruising, numbness, and recurrence of the condition) and benefits of the procedure, as well as the alternatives.  Informed consent was obtained. Anesthesia: none Type: none The area was prepared and draped in a standard fashion. The lesion drained blood. The patient tolerated the procedure well. The patient was instructed on post-op care.   Follow up plan: Return if symptoms worsen or fail to improve.

## 2022-07-24 ENCOUNTER — Telehealth: Payer: Self-pay | Admitting: Family Medicine

## 2022-07-24 ENCOUNTER — Encounter: Payer: Self-pay | Admitting: Family Medicine

## 2022-07-24 ENCOUNTER — Ambulatory Visit (INDEPENDENT_AMBULATORY_CARE_PROVIDER_SITE_OTHER): Payer: BC Managed Care – PPO | Admitting: Family Medicine

## 2022-07-24 VITALS — BP 132/80 | HR 99 | Resp 16 | Ht 63.0 in | Wt 153.0 lb

## 2022-07-24 DIAGNOSIS — L03019 Cellulitis of unspecified finger: Secondary | ICD-10-CM | POA: Diagnosis not present

## 2022-07-24 DIAGNOSIS — L03012 Cellulitis of left finger: Secondary | ICD-10-CM

## 2022-07-24 DIAGNOSIS — J301 Allergic rhinitis due to pollen: Secondary | ICD-10-CM | POA: Diagnosis not present

## 2022-07-24 LAB — WOUND CULTURE
MICRO NUMBER:: 13922876
RESULT:: NO GROWTH
SPECIMEN QUALITY:: ADEQUATE

## 2022-07-24 NOTE — Telephone Encounter (Signed)
Pt is scheduled for today Copied from [place] [id number]. Topic: Appointment Scheduling - Scheduling Inquiry for Clinic >> Jul 24, 2022  8:27 AM Janett Billow I wrote: Reason for CRM: Pt called and stated that she was seen in the office Paronychia of left middle finger last week and it is worse than before. She would like to know if she can be possible worked in today. She has allergy shots upstairs at 10. Please advise. She states that she is still in a lot of pain.

## 2022-07-24 NOTE — Progress Notes (Signed)
Name: Makayla Mason   MRN: [id number]    DOB: [birth year]   Date:07/24/2022       Progress Note  Subjective  Chief Complaint  Increased Finger Pain/Swelling  HPI  Paronychia- L middle finger, she was seen in our office on Friday and is today because even though she had an I&D and is taking antibiotics finger is more swollen . She has a ring on and is now stuck. She denies fever or chills.    Patient Active Problem List   Diagnosis Date Noted   Sprain of ulnar collateral ligament of metacarpophalangeal (MCP) joint of right thumb 06/08/2020   Cervical radicular pain 07/15/2018   Overweight (BMI 25.0-29.9) 01/29/2018   Right hip pain 01/29/2018   Insulin resistance 02/28/2017   Low grade squamous intraepith lesion on cytologic smear cervix (lgsil) 02/28/2017   GERD without esophagitis 03/15/2016   Left ankle pain 03/15/2016   Allergic rhinitis, seasonal 05/21/2015   Scoliosis 05/21/2015   Arthralgia of temporomandibular joint 05/21/2015    Past Surgical History:  Procedure Laterality Date   COLONOSCOPY WITH PROPOFOL N/A 02/15/2018   Procedure: COLONOSCOPY WITH PROPOFOL;  Surgeon: Jonathon Bellows, MD;  Location: [facility] ENDOSCOPY;  Service: Gastroenterology;  Laterality: N/A;    Family History  Problem Relation Age of Onset   Breast cancer Cousin    Diabetes Mother    Heart murmur Mother    Diabetes Father    Hypertension Father    Asthma Daughter    Anemia Maternal Aunt    Stroke Maternal Aunt    Kidney disease Maternal Grandmother    Hypertension Maternal Grandmother    Heart failure Maternal Grandmother    Heart attack Maternal Grandfather     Social History   Tobacco Use   Smoking status: Never   Smokeless tobacco: Never  Substance Use Topics   Alcohol use: Yes    Alcohol/week: 1.0 standard drink of alcohol    Types: 1 Glasses of wine per week     Current Outpatient Medications:    azelastine (ASTELIN) 0.1 % nasal spray, Place 2 sprays into both nostrils 2 (two)  times daily. Use in each nostril as directed, Disp: 30 mL, Rfl: 2   cephALEXin (KEFLEX) 500 MG capsule, Take 1 capsule (500 mg total) by mouth 4 (four) times daily for 5 days., Disp: 20 capsule, Rfl: 0   EPINEPHrine (EPIPEN 2-PAK) 0.3 mg/0.3 mL IJ SOAJ injection, USE AS DIRECTED, Disp: 2 each, Rfl: 0   estradiol-norethindrone (COMBIPATCH) 0.05-0.14 MG/DAY, Place 1 patch onto the skin twice a week, Disp: 8 patch, Rfl: 11   estrogen, conjugated,-medroxyprogesterone (PREMPRO) 0.625-5 MG tablet, Take 1 tablet by mouth once daily, Disp: 28 tablet, Rfl: 11   levocetirizine (XYZAL) 5 MG tablet, Take 1 tablet (5 mg total) by mouth every evening., Disp: 90 tablet, Rfl: 1   montelukast (SINGULAIR) 10 MG tablet, TAKE 1 TABLET BY MOUTH AT BEDTIME., Disp: 90 tablet, Rfl: 1   omeprazole (PRILOSEC) 40 MG capsule, TAKE 1 CAPSULE BY MOUTH DAILY., Disp: 90 capsule, Rfl: 1   triamcinolone (NASACORT) 55 MCG/ACT AERO nasal inhaler, 2 spray in each nostril daily, Disp: 16.9 mL, Rfl: 11  Allergies  Allergen Reactions   Codeine Nausea And Vomiting    I personally reviewed active problem list, medication list, allergies, family history, social history, health maintenance with the patient/caregiver today.   ROS  Ten systems reviewed and is negative except as mentioned in HPI   Objective  Vitals:   07/24/22 0921  BP: 132/80  Pulse: 99  Resp: 16  SpO2: 99%  Weight: 153 lb (69.4 kg)  Height: '5\' 3"'$  (1.6 m)    Body mass index is 27.1 kg/m.  Physical Exam  Constitutional: Patient appears well-developed and well-nourished. Overweight No distress.  HEENT: head atraumatic, normocephalic, pupils equal and reactive to light, neck supple Cardiovascular: Normal rate, regular rhythm and normal heart sounds.  No murmur heard. No BLE edema. Pulmonary/Chest: Effort normal and breath sounds normal. No respiratory distress. Abdominal: Soft.  There is no tenderness. Muscular Skeletal: see picture of left middle  finger, erythema, swollen, tender to touch.  Psychiatric: Patient has a normal mood and affect. behavior is normal. Judgment and thought content normal.   Recent Results (from the past 2160 hour(s))  Wound culture     Status: None (Preliminary result)   Collection Time: 07/20/22 11:49 AM   Specimen: Wound  Result Value Ref Range   MICRO NUMBER: [id number]    SPECIMEN QUALITY: Adequate    SOURCE: WOUND (SITE NOT SPECIFIED)    STATUS: PRELIMINARY    GRAM STAIN:      Rare White blood cells seen No epithelial cells seen No organisms seen   RESULT: No Growth   POCT Glucose (CBG)     Status: None   Collection Time: 07/20/22 11:56 AM  Result Value Ref Range   POC Glucose 79 70 - 99 mg/dl    PHQ2/9:    07/24/2022    9:21 AM 07/20/2022   11:32 AM 06/02/2022   10:57 AM 12/23/2021    9:42 AM 11/03/2021   12:38 PM  Depression screen PHQ 2/9  Decreased Interest 0 0 0 0 0  Down, Depressed, Hopeless 0 0 0 0 0  PHQ - 2 Score 0 0 0 0 0  Altered sleeping 0  0 0   Tired, decreased energy 0  0 0   Change in appetite 0  0 0   Feeling bad or failure about yourself  0  0 0   Trouble concentrating 0  0 0   Moving slowly or fidgety/restless 0  0 0   Suicidal thoughts 0  0 0   PHQ-9 Score 0  0 0     phq 9 is negative   Fall Risk:    07/24/2022    9:21 AM 07/20/2022   11:32 AM 06/02/2022   10:56 AM 03/15/2022    8:49 AM 01/11/2022   10:21 AM  Fall Risk   Falls in the past year? 0 0 0 0 0  Number falls in past yr: 0 0 0 0 0  Injury with Fall? 0 0 0 0 0  Risk for fall due to : No Fall Risks  No Fall Risks    Follow up Falls prevention discussed Falls evaluation completed Falls prevention discussed        Functional Status Survey: Is the patient deaf or have difficulty hearing?: No Does the patient have difficulty seeing, even when wearing glasses/contacts?: No Does the patient have difficulty concentrating, remembering, or making decisions?: No Does the patient have difficulty walking or  climbing stairs?: No Does the patient have difficulty dressing or bathing?: No Does the patient have difficulty doing errands alone such as visiting a doctor's office or shopping?: No    Assessment & Plan  1. Paronychia of left middle finger  - Ambulatory referral to Orthopedic Surgery

## 2022-07-25 ENCOUNTER — Telehealth: Payer: Self-pay | Admitting: Family Medicine

## 2022-07-25 NOTE — Telephone Encounter (Signed)
Medication Refill - Medication: cephALEXin (KEFLEX) 500 MG capsule  Has the patient contacted their pharmacy? No.  Preferred Pharmacy (with phone number or street name):  [facility] Phone:  [phone]  Fax:  [phone]     Has the patient been seen for an appointment in the last year OR does the patient have an upcoming appointment? Yes.    Agent: Please be advised that RX refills may take up to 3 business days. We ask that you follow-up with your pharmacy.

## 2022-07-26 NOTE — Telephone Encounter (Signed)
Not authorized

## 2022-07-31 DIAGNOSIS — J301 Allergic rhinitis due to pollen: Secondary | ICD-10-CM | POA: Diagnosis not present

## 2022-08-07 DIAGNOSIS — J301 Allergic rhinitis due to pollen: Secondary | ICD-10-CM | POA: Diagnosis not present

## 2022-08-10 NOTE — Patient Instructions (Signed)
Preventive Care 40-58 Years Old, Female Preventive care refers to lifestyle choices and visits with your health care provider that can promote health and wellness. Preventive care visits are also called wellness exams. What can I expect for my preventive care visit? Counseling Your health care provider may ask you questions about your: Medical history, including: Past medical problems. Family medical history. Pregnancy history. Current health, including: Menstrual cycle. Method of birth control. Emotional well-being. Home life and relationship well-being. Sexual activity and sexual health. Lifestyle, including: Alcohol, nicotine or tobacco, and drug use. Access to firearms. Diet, exercise, and sleep habits. Work and work environment. Sunscreen use. Safety issues such as seatbelt and bike helmet use. Physical exam Your health care provider will check your: Height and weight. These may be used to calculate your BMI (body mass index). BMI is a measurement that tells if you are at a healthy weight. Waist circumference. This measures the distance around your waistline. This measurement also tells if you are at a healthy weight and may help predict your risk of certain diseases, such as type 2 diabetes and high blood pressure. Heart rate and blood pressure. Body temperature. Skin for abnormal spots. What immunizations do I need?  Vaccines are usually given at various ages, according to a schedule. Your health care provider will recommend vaccines for you based on your age, medical history, and lifestyle or other factors, such as travel or where you work. What tests do I need? Screening Your health care provider may recommend screening tests for certain conditions. This may include: Lipid and cholesterol levels. Diabetes screening. This is done by checking your blood sugar (glucose) after you have not eaten for a while (fasting). Pelvic exam and Pap test. Hepatitis B test. Hepatitis C  test. HIV (human immunodeficiency virus) test. STI (sexually transmitted infection) testing, if you are at risk. Lung cancer screening. Colorectal cancer screening. Mammogram. Talk with your health care provider about when you should start having regular mammograms. This may depend on whether you have a family history of breast cancer. BRCA-related cancer screening. This may be done if you have a family history of breast, ovarian, tubal, or peritoneal cancers. Bone density scan. This is done to screen for osteoporosis. Talk with your health care provider about your test results, treatment options, and if necessary, the need for more tests. Follow these instructions at home: Eating and drinking  Eat a diet that includes fresh fruits and vegetables, whole grains, lean protein, and low-fat dairy products. Take vitamin and mineral supplements as recommended by your health care provider. Do not drink alcohol if: Your health care provider tells you not to drink. You are pregnant, may be pregnant, or are planning to become pregnant. If you drink alcohol: Limit how much you have to 0-1 drink a day. Know how much alcohol is in your drink. In the [place] one drink equals one 12 oz bottle of beer (355 mL), one 5 oz glass of wine (148 mL), or one 1 oz glass of hard liquor (44 mL). Lifestyle Brush your teeth every morning and night with fluoride toothpaste. Floss one time each day. Exercise for at least 30 minutes 5 or more days each week. Do not use any products that contain nicotine or tobacco. These products include cigarettes, chewing tobacco, and vaping devices, such as e-cigarettes. If you need help quitting, ask your health care provider. Do not use drugs. If you are sexually active, practice safe sex. Use a condom or other form of protection to   prevent STIs. If you do not wish to become pregnant, use a form of birth control. If you plan to become pregnant, see your health care provider for a  prepregnancy visit. Take aspirin only as told by your health care provider. Make sure that you understand how much to take and what form to take. Work with your health care provider to find out whether it is safe and beneficial for you to take aspirin daily. Find healthy ways to manage stress, such as: Meditation, yoga, or listening to music. Journaling. Talking to a trusted person. Spending time with friends and family. Minimize exposure to UV radiation to reduce your risk of skin cancer. Safety Always wear your seat belt while driving or riding in a vehicle. Do not drive: If you have been drinking alcohol. Do not ride with someone who has been drinking. When you are tired or distracted. While texting. If you have been using any mind-altering substances or drugs. Wear a helmet and other protective equipment during sports activities. If you have firearms in your house, make sure you follow all gun safety procedures. Seek help if you have been physically or sexually abused. What's next? Visit your health care provider once a year for an annual wellness visit. Ask your health care provider how often you should have your eyes and teeth checked. Stay up to date on all vaccines. This information is not intended to replace advice given to you by your health care provider. Make sure you discuss any questions you have with your health care provider. Document Revised: 04/20/2021 Document Reviewed: 04/20/2021 Elsevier Patient Education  [place].

## 2022-08-10 NOTE — Progress Notes (Signed)
Name: Makayla Mason   MRN: [id number]    DOB: May 17, 1966   Date:08/11/2022       Progress Note  Subjective  Chief Complaint  Annual Exam  HPI  Patient presents for annual CPE.  Diet: she is trying to eat more fruit , she likes vegetables Exercise: discussed importance of increasing physical activity   Last Eye Exam: yearly - Dr. Ellin Mayhew  Last Dental Exam: every 6 months - Dr Francina Ames Office Visit from 07/20/2022 in [facility]  AUDIT-C Score 0      Depression: Phq 9 is  negative    08/11/2022    9:52 AM 07/24/2022    9:21 AM 07/20/2022   11:32 AM 06/02/2022   10:57 AM 12/23/2021    9:42 AM  Depression screen PHQ 2/9  Decreased Interest 0 0 0 0 0  Down, Depressed, Hopeless 0 0 0 0 0  PHQ - 2 Score 0 0 0 0 0  Altered sleeping 0 0  0 0  Tired, decreased energy 0 0  0 0  Change in appetite 0 0  0 0  Feeling bad or failure about yourself  0 0  0 0  Trouble concentrating 0 0  0 0  Moving slowly or fidgety/restless 0 0  0 0  Suicidal thoughts 0 0  0 0  PHQ-9 Score 0 0  0 0   Hypertension: BP Readings from Last 3 Encounters:  08/11/22 118/72  07/24/22 132/80  07/20/22 128/72   Obesity: Wt Readings from Last 3 Encounters:  08/11/22 153 lb (69.4 kg)  07/24/22 153 lb (69.4 kg)  07/20/22 153 lb 12.8 oz (69.8 kg)   BMI Readings from Last 3 Encounters:  08/11/22 27.98 kg/m  07/24/22 27.10 kg/m  07/20/22 27.24 kg/m     Vaccines:   HPV: N/A Tdap: up to date Shingrix: up to date Pneumonia: N/A Flu: up to date COVID-19: discussed new booster    Hep C Screening: 03/20/16 STD testing and prevention (HIV/chl/gon/syphilis): 02/01/17 Intimate partner violence: negative screen  Sexual History :not in a while  Menstrual History/LMP/Abnormal Bleeding: s/p menopause - she has hot flashes  Discussed importance of follow up if any post-menopausal bleeding: yes  Incontinence Symptoms: negative for symptoms   Breast cancer:  - Last  Mammogram: 05/12/22 - BRCA gene screening: N/A  Osteoporosis Prevention : Discussed high calcium and vitamin D supplementation, weight bearing exercises Bone density: 12/13/10   Cervical cancer screening: 09/20/21  Skin cancer: Discussed monitoring for atypical lesions  Colorectal cancer: 02/15/18   Lung cancer:  Low Dose CT Chest recommended if Age 57-80 years, 20 pack-year currently smoking OR have quit w/in 15years. Patient does not qualify for screen   ECG: 06/09/20  Advanced Care Planning: A voluntary discussion about advance care planning including the explanation and discussion of advance directives.  Discussed health care proxy and Living will, and the patient was able to identify a health care proxy as both daughters .  Patient does have a living will and power of attorney of health care   Lipids: Lab Results  Component Value Date   CHOL 169 08/10/2021   CHOL 187 06/08/2020   CHOL 181 12/04/2018   Lab Results  Component Value Date   HDL 56 08/10/2021   HDL 52 06/08/2020   HDL 53 12/04/2018   Lab Results  Component Value Date   LDLCALC 96 08/10/2021   LDLCALC 114 (H) 06/08/2020   LDLCALC 107 (H)  12/04/2018   Lab Results  Component Value Date   TRIG 77 08/10/2021   TRIG 115 06/08/2020   TRIG 114 12/04/2018   Lab Results  Component Value Date   CHOLHDL 3.0 08/10/2021   CHOLHDL 3.6 06/08/2020   CHOLHDL 3.4 12/04/2018   No results found for: "LDLDIRECT"  Glucose: Glucose, Bld  Date Value Ref Range Status  08/10/2021 88 65 - 99 mg/dL Final    Comment:    .            Fasting reference interval .   06/06/2020 97 70 - 99 mg/dL Final    Comment:    Glucose reference range applies only to samples taken after fasting for at least 8 hours.  12/04/2018 82 65 - 99 mg/dL Final    Comment:    .            Fasting reference interval .     Patient Active Problem List   Diagnosis Date Noted   Sprain of ulnar collateral ligament of metacarpophalangeal (MCP) joint  of right thumb 06/08/2020   Cervical radicular pain 07/15/2018   Overweight (BMI 25.0-29.9) 01/29/2018   Right hip pain 01/29/2018   Insulin resistance 02/28/2017   Low grade squamous intraepith lesion on cytologic smear cervix (lgsil) 02/28/2017   GERD without esophagitis 03/15/2016   Left ankle pain 03/15/2016   Allergic rhinitis, seasonal 05/21/2015   Scoliosis 05/21/2015   Arthralgia of temporomandibular joint 05/21/2015    Past Surgical History:  Procedure Laterality Date   COLONOSCOPY WITH PROPOFOL N/A 02/15/2018   Procedure: COLONOSCOPY WITH PROPOFOL;  Surgeon: Jonathon Bellows, MD;  Location: [facility] END[facility];  Service: Gastroenterology;  Laterality: N/A;    Family History  Problem Relation Age of Onset   Breast cancer Cousin    Diabetes Mother    Heart murmur Mother    Diabetes Father    Hypertension Father    Asthma Daughter    Anemia Maternal Aunt    Stroke Maternal Aunt    Kidney disease Maternal Grandmother    Hypertension Maternal Grandmother    Heart failure Maternal Grandmother    Heart attack Maternal Grandfather     Social History   Socioeconomic History   Marital status: Divorced    Spouse name: Not on file   Number of children: 2   Years of education: Not on file   Highest education level: Bachelor's degree (e.g., BA, AB, BS)  Occupational History   Not on file  Tobacco Use   Smoking status: Never   Smokeless tobacco: Never  Vaping Use   Vaping Use: Never used  Substance and Sexual Activity   Alcohol use: Yes    Alcohol/week: 1.0 standard drink of alcohol    Types: 1 Glasses of wine per week   Drug use: No   Sexual activity: Not Currently    Birth control/protection: Post-menopausal  Other Topics Concern   Not on file  Social History Narrative   Lives in [place], grown daughters and grandson are at home   Social Determinants of Health   Financial Resource Strain: Medium Risk (08/11/2022)   Overall Financial Resource Strain (CARDIA)     Difficulty of Paying Living Expenses: Somewhat hard  Food Insecurity: Food Insecurity Present (08/11/2022)   Hunger Vital Sign    Worried About Running Out of Food in the Last Year: Sometimes true    Ran Out of Food in the Last Year: Sometimes true  Transportation Needs: No Transportation Needs (08/11/2022)  PRAPARE - Hydrologist (Medical): No    Lack of Transportation (Non-Medical): No  Physical Activity: Insufficiently Active (08/11/2022)   Exercise Vital Sign    Days of Exercise per Week: 3 days    Minutes of Exercise per Session: 30 min  Stress: No Stress Concern Present (08/11/2022)   [place]    Feeling of Stress : Only a little  Social Connections: Moderately Integrated (08/11/2022)   Social Connection and Isolation Panel [NHANES]    Frequency of Communication with Friends and Family: More than three times a week    Frequency of Social Gatherings with Friends and Family: More than three times a week    Attends Religious Services: More than 4 times per year    Active Member of [employer] or Organizations: Yes    Attends Archivist Meetings: 1 to 4 times per year    Marital Status: Divorced  Intimate Partner Violence: Not At Risk (08/11/2022)   Humiliation, Afraid, Rape, and Kick questionnaire    Fear of Current or Ex-Partner: No    Emotionally Abused: No    Physically Abused: No    Sexually Abused: No     Current Outpatient Medications:    azelastine (ASTELIN) 0.1 % nasal spray, Place 2 sprays into both nostrils 2 (two) times daily. Use in each nostril as directed, Disp: 30 mL, Rfl: 2   EPINEPHrine (EPIPEN 2-PAK) 0.3 mg/0.3 mL IJ SOAJ injection, USE AS DIRECTED, Disp: 2 each, Rfl: 0   estradiol-norethindrone (COMBIPATCH) 0.05-0.14 MG/DAY, Place 1 patch onto the skin twice a week, Disp: 8 patch, Rfl: 11   estrogen, conjugated,-medroxyprogesterone (PREMPRO) 0.625-5 MG tablet, Take  1 tablet by mouth once daily, Disp: 28 tablet, Rfl: 11   levocetirizine (XYZAL) 5 MG tablet, Take 1 tablet (5 mg total) by mouth every evening., Disp: 90 tablet, Rfl: 1   montelukast (SINGULAIR) 10 MG tablet, TAKE 1 TABLET BY MOUTH AT BEDTIME., Disp: 90 tablet, Rfl: 1   omeprazole (PRILOSEC) 40 MG capsule, TAKE 1 CAPSULE BY MOUTH DAILY., Disp: 90 capsule, Rfl: 1   triamcinolone (NASACORT) 55 MCG/ACT AERO nasal inhaler, 2 spray in each nostril daily, Disp: 16.9 mL, Rfl: 11  Allergies  Allergen Reactions   Codeine Nausea And Vomiting     ROS  Constitutional: Negative for fever or weight change.  Respiratory: Negative for cough and shortness of breath.   Cardiovascular: Negative for chest pain or palpitations.  Gastrointestinal: Negative for abdominal pain, no bowel changes.  Musculoskeletal: Negative for gait problem or joint swelling.  Skin: Negative for rash.  Neurological: Negative for dizziness or headache.  No other specific complaints in a complete review of systems (except as listed in HPI above).   Objective  Vitals:   08/11/22 0952  BP: 118/72  Pulse: 94  Resp: 16  SpO2: 98%  Weight: 153 lb (69.4 kg)  Height: _0  (1.575 m)    Body mass index is 27.98 kg/m.  Physical Exam  Constitutional: Patient appears well-developed and well-nourished. No distress.  HENT: Head: Normocephalic and atraumatic. Ears: B TMs ok, no erythema or effusion; Nose: Nose normal. Mouth/Throat: Oropharynx is clear and moist. No oropharyngeal exudate.  Eyes: Conjunctivae and EOM are normal. Pupils are equal, round, and reactive to light. No scleral icterus.  Neck: Normal range of motion. Neck supple. No JVD present. No thyromegaly present.  Cardiovascular: Normal rate, regular rhythm and normal heart sounds.  No murmur  heard. No BLE edema. Pulmonary/Chest: Effort normal and breath sounds normal. No respiratory distress. Abdominal: Soft. Bowel sounds are normal, no distension. There is no  tenderness. no masses Breast: no lumps or masses, no nipple discharge or rashes FEMALE GENITALIA:  Not done  RECTAL: not done  Musculoskeletal: Normal range of motion, no joint effusions. No gross deformities Neurological: he is alert and oriented to person, place, and time. No cranial nerve deficit. Coordination, balance, strength, speech and gait are normal.  Skin: Skin is warm and dry. No rash noted. No erythema.  Psychiatric: Patient has a normal mood and affect. behavior is normal. Judgment and thought content normal.   Recent Results (from the past 2160 hour(s))  Wound culture     Status: None   Collection Time: 07/20/22 11:49 AM   Specimen: Wound  Result Value Ref Range   MICRO NUMBER: [id number]    SPECIMEN QUALITY: Adequate    SOURCE: WOUND (SITE NOT SPECIFIED)    STATUS: FINAL    GRAM STAIN:      Rare White blood cells seen No epithelial cells seen No organisms seen   RESULT: No Growth   POCT Glucose (CBG)     Status: None   Collection Time: 07/20/22 11:56 AM  Result Value Ref Range   POC Glucose 79 70 - 99 mg/dl     Fall Risk:    08/11/2022    9:52 AM 07/24/2022    9:21 AM 07/20/2022   11:32 AM 06/02/2022   10:56 AM 03/15/2022    8:49 AM  Fall Risk   Falls in the past year? 0 0 0 0 0  Number falls in past yr: 0 0 0 0 0  Injury with Fall? 0 0 0 0 0  Risk for fall due to : No Fall Risks No Fall Risks  No Fall Risks   Follow up Falls prevention discussed Falls prevention discussed Falls evaluation completed Falls prevention discussed      Functional Status Survey: Is the patient deaf or have difficulty hearing?: No Does the patient have difficulty seeing, even when wearing glasses/contacts?: No Does the patient have difficulty concentrating, remembering, or making decisions?: No Does the patient have difficulty walking or climbing stairs?: Yes Does the patient have difficulty dressing or bathing?: No Does the patient have difficulty doing errands alone such as  visiting a doctor's office or shopping?: No   Assessment & Plan  1. Well adult exam  - Lipid panel - CBC with Differential/Platelet - COMPLETE METABOLIC PANEL WITH GFR - Hemoglobin A1c  2. Prediabetes  - Hemoglobin A1c  3. Screening for deficiency anemia  - CBC with Differential/Platelet  4. Long-term use of high-risk medication  - COMPLETE METABOLIC PANEL WITH GFR  5. Screening cholesterol level  - Lipid panel  6. Vasomotor symptoms due to menopause  - venlafaxine XR (EFFEXOR XR) 37.5 MG 24 hr capsule; Take 1 capsule (37.5 mg total) by mouth daily with breakfast.  Dispense: 30 capsule; Refill: 0 - venlafaxine XR (EFFEXOR XR) 75 MG 24 hr capsule; Take 1 capsule (75 mg total) by mouth daily with breakfast.  Dispense: 90 capsule; Refill: 1   -USPSTF grade A and B recommendations reviewed with patient; age-appropriate recommendations, preventive care, screening tests, etc discussed and encouraged; healthy living encouraged; see AVS for patient education given to patient -Discussed importance of 150 minutes of physical activity weekly, eat two servings of fish weekly, eat one serving of tree nuts ( cashews, pistachios, pecans, almonds.Marland Kitchen) every other  day, eat 6 servings of fruit/vegetables daily and drink plenty of water and avoid sweet beverages.   -Reviewed Health Maintenance: Yes.

## 2022-08-11 ENCOUNTER — Encounter: Payer: Self-pay | Admitting: Family Medicine

## 2022-08-11 ENCOUNTER — Ambulatory Visit (INDEPENDENT_AMBULATORY_CARE_PROVIDER_SITE_OTHER): Payer: BC Managed Care – PPO | Admitting: Family Medicine

## 2022-08-11 ENCOUNTER — Other Ambulatory Visit: Payer: Self-pay

## 2022-08-11 VITALS — BP 118/72 | HR 94 | Resp 16 | Ht 62.0 in | Wt 153.0 lb

## 2022-08-11 DIAGNOSIS — Z79899 Other long term (current) drug therapy: Secondary | ICD-10-CM

## 2022-08-11 DIAGNOSIS — R7303 Prediabetes: Secondary | ICD-10-CM

## 2022-08-11 DIAGNOSIS — Z130 Encounter for screening for diseases of the blood and blood-forming organs and certain disorders involving the immune mechanism: Secondary | ICD-10-CM | POA: Diagnosis not present

## 2022-08-11 DIAGNOSIS — Z0000 Encounter for general adult medical examination without abnormal findings: Secondary | ICD-10-CM | POA: Diagnosis not present

## 2022-08-11 DIAGNOSIS — Z13220 Encounter for screening for lipoid disorders: Secondary | ICD-10-CM | POA: Diagnosis not present

## 2022-08-11 DIAGNOSIS — N951 Menopausal and female climacteric states: Secondary | ICD-10-CM

## 2022-08-11 MED ORDER — VENLAFAXINE HCL ER 37.5 MG PO CP24
37.5000 mg | ORAL_CAPSULE | Freq: Every day | ORAL | 0 refills | Status: AC
  Filled 2022-08-11: qty 30, 30d supply, fill #0

## 2022-08-11 MED ORDER — VENLAFAXINE HCL ER 75 MG PO CP24
75.0000 mg | ORAL_CAPSULE | Freq: Every day | ORAL | 1 refills | Status: AC
  Filled 2022-08-11 – 2022-09-11 (×3): qty 90, 90d supply, fill #0
  Filled 2022-09-11 (×4): qty 30, 30d supply, fill #0
  Filled 2022-12-02: qty 30, 30d supply, fill #1
  Filled 2023-05-30: qty 90, 90d supply, fill #2

## 2022-08-12 LAB — CBC WITH DIFFERENTIAL/PLATELET
Absolute Monocytes: 593 cells/uL (ref 200–950)
Basophils Absolute: 41 cells/uL (ref 0–200)
Basophils Relative: 0.6 %
Eosinophils Absolute: 414 cells/uL (ref 15–500)
Eosinophils Relative: 6 %
HCT: 40.3 % (ref 35.0–45.0)
Hemoglobin: 13.1 g/dL (ref 11.7–15.5)
Lymphs Abs: 2877 cells/uL (ref 850–3900)
MCH: 28.9 pg (ref 27.0–33.0)
MCHC: 32.5 g/dL (ref 32.0–36.0)
MCV: 88.8 fL (ref 80.0–100.0)
MPV: 9.9 fL (ref 7.5–12.5)
Monocytes Relative: 8.6 %
Neutro Abs: 2974 cells/uL (ref 1500–7800)
Neutrophils Relative %: 43.1 %
Platelets: 334 10*3/uL (ref 140–400)
RBC: 4.54 10*6/uL (ref 3.80–5.10)
RDW: 13.2 % (ref 11.0–15.0)
Total Lymphocyte: 41.7 %
WBC: 6.9 10*3/uL (ref 3.8–10.8)

## 2022-08-12 LAB — COMPLETE METABOLIC PANEL WITH GFR
AG Ratio: 1.3 (calc) (ref 1.0–2.5)
ALT: 16 U/L (ref 6–29)
AST: 15 U/L (ref 10–35)
Albumin: 4 g/dL (ref 3.6–5.1)
Alkaline phosphatase (APISO): 80 U/L (ref 37–153)
BUN: 16 mg/dL (ref 7–25)
CO2: 29 mmol/L (ref 20–32)
Calcium: 9.5 mg/dL (ref 8.6–10.4)
Chloride: 107 mmol/L (ref 98–110)
Creat: 0.73 mg/dL (ref 0.50–1.03)
Globulin: 3.1 g/dL (calc) (ref 1.9–3.7)
Glucose, Bld: 75 mg/dL (ref 65–99)
Potassium: 4.1 mmol/L (ref 3.5–5.3)
Sodium: 142 mmol/L (ref 135–146)
Total Bilirubin: 0.3 mg/dL (ref 0.2–1.2)
Total Protein: 7.1 g/dL (ref 6.1–8.1)
eGFR: 95 mL/min/{1.73_m2} (ref >=60)

## 2022-08-12 LAB — LIPID PANEL
Cholesterol: 186 mg/dL (ref <200)
HDL: 53 mg/dL (ref >=50)
LDL Cholesterol (Calc): 112 mg/dL (calc) — ABNORMAL HIGH
Non-HDL Cholesterol (Calc): 133 mg/dL (calc) — ABNORMAL HIGH (ref <130)
Total CHOL/HDL Ratio: 3.5 (calc) (ref <5.0)
Triglycerides: 106 mg/dL (ref <150)

## 2022-08-12 LAB — HEMOGLOBIN A1C
Hgb A1c MFr Bld: 5.8 % of total Hgb — ABNORMAL HIGH (ref <5.7)
Mean Plasma Glucose: 120 mg/dL
eAG (mmol/L): 6.6 mmol/L

## 2022-08-14 DIAGNOSIS — J301 Allergic rhinitis due to pollen: Secondary | ICD-10-CM | POA: Diagnosis not present

## 2022-08-21 DIAGNOSIS — J301 Allergic rhinitis due to pollen: Secondary | ICD-10-CM | POA: Diagnosis not present

## 2022-08-23 DIAGNOSIS — J301 Allergic rhinitis due to pollen: Secondary | ICD-10-CM | POA: Diagnosis not present

## 2022-08-28 DIAGNOSIS — J301 Allergic rhinitis due to pollen: Secondary | ICD-10-CM | POA: Diagnosis not present

## 2022-09-04 DIAGNOSIS — J301 Allergic rhinitis due to pollen: Secondary | ICD-10-CM | POA: Diagnosis not present

## 2022-09-05 ENCOUNTER — Other Ambulatory Visit: Payer: Self-pay | Admitting: Family Medicine

## 2022-09-05 ENCOUNTER — Other Ambulatory Visit: Payer: Self-pay

## 2022-09-05 DIAGNOSIS — J302 Other seasonal allergic rhinitis: Secondary | ICD-10-CM

## 2022-09-06 ENCOUNTER — Other Ambulatory Visit: Payer: Self-pay

## 2022-09-06 MED FILL — Montelukast Sodium Tab 10 MG (Base Equiv): ORAL | 90 days supply | Qty: 90 | Fill #0 | Status: AC

## 2022-09-06 NOTE — Telephone Encounter (Signed)
Requested Prescriptions  Pending Prescriptions Disp Refills  . montelukast (SINGULAIR) 10 MG tablet 90 tablet 3    Sig: TAKE 1 TABLET BY MOUTH AT BEDTIME.     Pulmonology:  Leukotriene Inhibitors Passed - 09/05/2022  5:53 PM      Passed - Valid encounter within last 12 months    Recent Outpatient Visits          3 weeks ago Well adult exam   [facility] Steele Sizer, MD   1 month ago Paronychia of left middle finger   [facility] Steele Sizer, MD   1 month ago Paronychia of left middle finger   [place], FNP   3 months ago [facility] Steele Sizer, MD   8 months ago Cervical radicular pain   [facility] Steele Sizer, MD      Future Appointments            In 22 months Steele Sizer, MD [facility], [facility]

## 2022-09-08 ENCOUNTER — Ambulatory Visit: Payer: BC Managed Care – PPO | Admitting: Family Medicine

## 2022-09-11 ENCOUNTER — Other Ambulatory Visit: Payer: Self-pay | Admitting: Family Medicine

## 2022-09-11 ENCOUNTER — Other Ambulatory Visit: Payer: Self-pay

## 2022-09-11 DIAGNOSIS — J301 Allergic rhinitis due to pollen: Secondary | ICD-10-CM | POA: Diagnosis not present

## 2022-09-11 DIAGNOSIS — K219 Gastro-esophageal reflux disease without esophagitis: Secondary | ICD-10-CM

## 2022-09-12 MED FILL — Omeprazole Cap Delayed Release 40 MG: ORAL | 90 days supply | Qty: 90 | Fill #0 | Status: CN

## 2022-09-13 ENCOUNTER — Other Ambulatory Visit: Payer: Self-pay

## 2022-09-15 ENCOUNTER — Other Ambulatory Visit: Payer: Self-pay

## 2022-09-18 ENCOUNTER — Other Ambulatory Visit: Payer: Self-pay

## 2022-09-18 DIAGNOSIS — J301 Allergic rhinitis due to pollen: Secondary | ICD-10-CM | POA: Diagnosis not present

## 2022-09-25 DIAGNOSIS — J301 Allergic rhinitis due to pollen: Secondary | ICD-10-CM | POA: Diagnosis not present

## 2022-09-29 ENCOUNTER — Other Ambulatory Visit: Payer: Self-pay

## 2022-10-02 ENCOUNTER — Other Ambulatory Visit: Payer: Self-pay

## 2022-10-02 DIAGNOSIS — J301 Allergic rhinitis due to pollen: Secondary | ICD-10-CM | POA: Diagnosis not present

## 2022-10-02 MED FILL — Omeprazole Cap Delayed Release 40 MG: ORAL | 90 days supply | Qty: 90 | Fill #0 | Status: AC

## 2022-10-09 DIAGNOSIS — J301 Allergic rhinitis due to pollen: Secondary | ICD-10-CM | POA: Diagnosis not present

## 2022-10-16 DIAGNOSIS — J301 Allergic rhinitis due to pollen: Secondary | ICD-10-CM | POA: Diagnosis not present

## 2022-10-23 DIAGNOSIS — J301 Allergic rhinitis due to pollen: Secondary | ICD-10-CM | POA: Diagnosis not present

## 2022-10-25 ENCOUNTER — Ambulatory Visit: Payer: BC Managed Care – PPO

## 2022-10-25 ENCOUNTER — Ambulatory Visit: Payer: BC Managed Care – PPO | Admitting: Family Medicine

## 2022-11-01 ENCOUNTER — Ambulatory Visit
Admission: RE | Admit: 2022-11-01 | Discharge: 2022-11-01 | Disposition: A | Discharge status: Home / Self Care | Payer: BC Managed Care – PPO | Attending: Physician Assistant | Admitting: Physician Assistant

## 2022-11-01 ENCOUNTER — Encounter: Payer: Self-pay | Admitting: Physician Assistant

## 2022-11-01 ENCOUNTER — Ambulatory Visit
Admission: RE | Admit: 2022-11-01 | Discharge: 2022-11-01 | Disposition: A | Discharge status: Home / Self Care | Payer: BC Managed Care – PPO | Source: Ambulatory Visit | Attending: Physician Assistant | Admitting: Physician Assistant

## 2022-11-01 ENCOUNTER — Ambulatory Visit (INDEPENDENT_AMBULATORY_CARE_PROVIDER_SITE_OTHER): Payer: BC Managed Care – PPO | Admitting: Physician Assistant

## 2022-11-01 ENCOUNTER — Telehealth: Payer: Self-pay | Admitting: Family Medicine

## 2022-11-01 VITALS — BP 118/74 | HR 100 | Temp 97.8°F | Resp 18 | Ht 63.0 in | Wt 147.7 lb

## 2022-11-01 DIAGNOSIS — R109 Unspecified abdominal pain: Secondary | ICD-10-CM | POA: Insufficient documentation

## 2022-11-01 DIAGNOSIS — R300 Dysuria: Secondary | ICD-10-CM

## 2022-11-01 LAB — POCT URINALYSIS DIPSTICK
Bilirubin, UA: NEGATIVE
Blood, UA: NEGATIVE
Glucose, UA: NEGATIVE
Ketones, UA: NEGATIVE
Leukocytes, UA: NEGATIVE
Nitrite, UA: NEGATIVE
Odor: NORMAL
Protein, UA: NEGATIVE
Spec Grav, UA: 1.02 (ref 1.010–1.025)
Urobilinogen, UA: 0.2 E.U./dL
pH, UA: 6.5 (ref 5.0–8.0)

## 2022-11-01 NOTE — Patient Instructions (Signed)
Please go here for your  xray   [place] [address], [place] [id number]

## 2022-11-01 NOTE — Telephone Encounter (Signed)
Called patient and reviewed labs. I informed her all that was resulted was the in-office urine dip. She is aware we are waiting for the cervicovaginal swab to come back.

## 2022-11-01 NOTE — Telephone Encounter (Signed)
Pt called to discuss lab results she sees in her [place], please advise   Best contact: [phone]

## 2022-11-01 NOTE — Progress Notes (Signed)
Acute Office Visit   Patient: Makayla Mason   DOB: 29-Dec-1965   56 y.o. Female  MRN: [id number] Visit Date: 11/01/2022  Today's healthcare provider: Dani Gobble Shephanie Romas, PA-C  Introduced myself to the patient as a Journalist, newspaper and provided education on APPs in clinical practice.    Chief Complaint  Patient presents with   Urinary Tract Infection    Dysuria and low back pain   Subjective    HPI HPI     Urinary Tract Infection    Additional comments: Dysuria and low back pain      Last edited by Cathrine Muster, CMA on 11/01/2022 10:32 AM.        Concern for UTI Onset: sudden  Duration: 2 days Associated Symptoms: Reports pressure and mild discomfort along lower abdomen, dark urine, increased urinary frequency, burning with urination  Pain location and character: colicky pain along flanks  Radiation: some flank pain  Fever:no  Interventions: none     She denies previous hx of kidney stones and gross hematuria      Medications: Outpatient Medications Prior to Visit  Medication Sig   azelastine (ASTELIN) 0.1 % nasal spray Place 2 sprays into both nostrils 2 (two) times daily. Use in each nostril as directed   EPINEPHrine (EPIPEN 2-PAK) 0.3 mg/0.3 mL IJ SOAJ injection USE AS DIRECTED   estradiol-norethindrone (COMBIPATCH) 0.05-0.14 MG/DAY Place 1 patch onto the skin twice a week   estrogen, conjugated,-medroxyprogesterone (PREMPRO) 0.625-5 MG tablet Take 1 tablet by mouth once daily   levocetirizine (XYZAL) 5 MG tablet Take 1 tablet (5 mg total) by mouth every evening.   montelukast (SINGULAIR) 10 MG tablet TAKE 1 TABLET BY MOUTH AT BEDTIME.   omeprazole (PRILOSEC) 40 MG capsule TAKE 1 CAPSULE BY MOUTH DAILY.   triamcinolone (NASACORT) 55 MCG/ACT AERO nasal inhaler 2 spray in each nostril daily   venlafaxine XR (EFFEXOR XR) 37.5 MG 24 hr capsule Take 1 capsule (37.5 mg total) by mouth daily with breakfast.   venlafaxine XR (EFFEXOR XR) 75 MG 24 hr capsule Take 1 capsule  (75 mg total) by mouth daily with breakfast.   No facility-administered medications prior to visit.    Review of Systems  Genitourinary:  Positive for dysuria, flank pain and frequency. Negative for decreased urine volume, difficulty urinating, enuresis, hematuria, urgency, vaginal bleeding, vaginal discharge and vaginal pain.       Objective    BP 118/74   Pulse 100   Temp 97.8 F (36.6 C)   Resp 18   Ht '5\' 3"'$  (1.6 m)   Wt 147 lb 11.2 oz (67 kg)   SpO2 99%   BMI 26.16 kg/m    Physical Exam Vitals reviewed.  Constitutional:      General: She is awake.     Appearance: Normal appearance. She is well-developed and well-groomed.  HENT:     Head: Normocephalic and atraumatic.  Eyes:     Extraocular Movements: Extraocular movements intact.     Conjunctiva/sclera: Conjunctivae normal.     Pupils: Pupils are equal, round, and reactive to light.  Cardiovascular:     Rate and Rhythm: Normal rate and regular rhythm.     Heart sounds: Normal heart sounds. No murmur heard.    No friction rub. No gallop.  Pulmonary:     Effort: Pulmonary effort is normal.     Breath sounds: Normal breath sounds. No decreased air movement. No decreased breath sounds, wheezing, rhonchi  or rales.  Abdominal:     General: Abdomen is flat. Bowel sounds are normal.     Palpations: Abdomen is soft.     Tenderness: There is abdominal tenderness in the suprapubic area. There is no right CVA tenderness, left CVA tenderness, guarding or rebound.  Musculoskeletal:     Cervical back: Normal range of motion.  Skin:    General: Skin is warm and moist.  Neurological:     General: No focal deficit present.     Mental Status: She is alert and oriented to person, place, and time. Mental status is at baseline.  Psychiatric:        Attention and Perception: Attention and perception normal.        Mood and Affect: Mood and affect normal.        Speech: Speech normal.        Behavior: Behavior normal. Behavior is  cooperative.        Thought Content: Thought content normal.        Judgment: Judgment normal.       Results for orders placed or performed in visit on 11/01/22  POCT Urinalysis Dipstick  Result Value Ref Range   Color, UA yellow    Clarity, UA clear    Glucose, UA Negative Negative   Bilirubin, UA neg    Ketones, UA neg    Spec Grav, UA 1.020 1.010 - 1.025   Blood, UA neg    pH, UA 6.5 5.0 - 8.0   Protein, UA Negative Negative   Urobilinogen, UA 0.2 0.2 or 1.0 E.U./dL   Nitrite, UA neg    Leukocytes, UA Negative Negative   Appearance clear    Odor normal     Assessment & Plan      No follow-ups on file.       Problem List Items Addressed This Visit   None Visit Diagnoses     Dysuria    -  Primary Acute, new concern, ongoing for 2 days Reports increased urinary frequency and discomfort with urination along with mild flank pain UA was negative today - results discussed with patient during apt Will order Cervicovaginal swab to rule out yeast, BV and trich KUB to assist with kidney stone rule out Results to dictate further management Follow up as needed for persistent or progressing symptoms    Relevant Orders   POCT Urinalysis Dipstick (Completed)   DG Abd 1 View   Cervicovaginal ancillary only   Acute flank pain     See Dysuria A&P above for management plan    Relevant Orders   POCT Urinalysis Dipstick (Completed)   DG Abd 1 View        No follow-ups on file.   I, Josee Speece E Daquann Merriott, PA-C, have reviewed all documentation for this visit. The documentation on 11/01/22 for the exam, diagnosis, procedures, and orders are all accurate and complete.   Talitha Givens, MHS, PA-C [facility]

## 2022-11-02 ENCOUNTER — Ambulatory Visit: Payer: BC Managed Care – PPO | Admitting: Family Medicine

## 2022-11-10 ENCOUNTER — Other Ambulatory Visit: Payer: Self-pay

## 2022-11-10 ENCOUNTER — Ambulatory Visit (INDEPENDENT_AMBULATORY_CARE_PROVIDER_SITE_OTHER): Payer: BC Managed Care – PPO | Admitting: Family Medicine

## 2022-11-10 ENCOUNTER — Encounter: Payer: Self-pay | Admitting: Family Medicine

## 2022-11-10 VITALS — BP 114/68 | HR 86 | Temp 98.3°F | Resp 16 | Ht 63.0 in | Wt 152.3 lb

## 2022-11-10 DIAGNOSIS — R040 Epistaxis: Secondary | ICD-10-CM

## 2022-11-10 DIAGNOSIS — J329 Chronic sinusitis, unspecified: Secondary | ICD-10-CM | POA: Diagnosis not present

## 2022-11-10 MED ORDER — TRIAMCINOLONE ACETONIDE 55 MCG/ACT NA AERO
INHALATION_SPRAY | NASAL | 11 refills | Status: AC
  Filled 2022-11-10: qty 16.9, fill #0

## 2022-11-10 NOTE — Patient Instructions (Addendum)
Recommend taking your prescribed allergy medication more regularly - especially if you are unable to get your shots done - you should keep the other medication in your system as regularly as possible Add saline nasal spray, a cool mister to add moisture to nasal mucosa Restart the intranasal steroid spray to help reduce swelling and inflamation  Follow up if there is any worsening  I do recommend an ENT follow up if you continue to have some nose bleeding here or there.  Nosebleed, Adult A nosebleed is when blood comes out of the nose. Nosebleeds are common and can be caused by many things. They are usually not a sign of a serious medical problem. Follow these instructions at home: When you have a nosebleed:  Sit down. Tilt your head forward a little. Follow these steps: Pinch your nose with a clean towel or tissue. Keep pinching your nose for 5 minutes. Do not let go. After 5 minutes, let go of your nose. Keep doing these steps until the bleeding stops. Do not put tissues or other things in your nose to stop the bleeding. Avoid lying down or putting your head back. Use a nose spray decongestant as told by your doctor. After a nosebleed: Try not to blow your nose or sniffle for several hours. Try not to strain, lift, or bend at the waist for several days. Aspirin and medicines that thin your blood make bleeding more likely. If you take these medicines: Ask your doctor if you should stop taking them or if you should change how much you take. Do not stop taking the medicine unless your doctor tells you to. If your nosebleed was caused by dryness, use over-the-counter saline nasal spray or gel and a humidifier as told by your doctor. This will keep the inside of your nose moist and allow it to heal. If you need to use nasal spray or gel: Choose one that is water-soluble. Use only as much as you need and use it only as often as needed. Do not lie down right away after you use it. If you  get nosebleeds often, talk with your doctor about treatments. These may include: Nasal cautery. A chemical swab or electrical device is used to lightly burn tiny blood vessels inside the nose. This helps stop or prevent nosebleeds. Nasal packing. A gauze or other material is placed in the nose to keep constant pressure on the bleeding area. Contact a doctor if: You have a fever. You get nosebleeds often. You get nosebleeds more often than usual. You bruise very easily. You have something stuck in your nose. You are bleeding in your mouth. You vomit or cough up brown material. You get a nosebleed after you start a new medicine. Get help right away if: You have a nosebleed after you fall or hurt your head. Your nosebleed does not go away after 20 minutes. You feel dizzy or weak. You have unusual bleeding from other parts of your body. You have unusual bruising on other parts of your body. You get sweaty. You vomit blood. Summary Nosebleeds are common. They are usually not a sign of a serious medical problem. When you have a nosebleed, sit down and tilt your head a little forward. Pinch your nose with a clean tissue for 5 minutes. Use saline spray or saline gel and a humidifier as told by your doctor. Get help right away if your nosebleed does not go away after 20 minutes. This information is not intended to replace advice  given to you by your health care provider. Make sure you discuss any questions you have with your health care provider. Document Revised: 11/01/2021 Document Reviewed: 11/01/2021 Elsevier Patient Education  [place].

## 2022-11-10 NOTE — Progress Notes (Signed)
Patient ID: Makayla Mason, female    DOB: [birth year], 57 y.o.   MRN: [id number]  PCP: Steele Sizer, MD  Chief Complaint  Patient presents with   Epistaxis    Pt states sneezing/ blowing her nose blood comes out.   Nasal Congestion    Subjective:   Makayla Mason is a 57 y.o. female, presents to clinic with CC of the following:  HPI  Patient presents with increased nasal and sinus symptoms for the past couple weeks with nosebleeds She will have drops of blood sometimes drip out her nose or sometimes she can taste blood in the back of her throat.  Nasal discharge is sometimes brown No bleeding where she has had to hold pressure She does have allergies and gets weekly allergy shots however the past 2 weeks she has been without that treatment due to the Monday holidays for Christmas and New Year's She has prescribed nasal sprays Astelin, Nasacort, and allergy medication Xyzal and Singulair which she states that she does not use regularly, and with recent symptoms she has not increased use     Patient Active Problem List   Diagnosis Date Noted   Vasomotor symptoms due to menopause 08/11/2022   Sprain of ulnar collateral ligament of metacarpophalangeal (MCP) joint of right thumb 06/08/2020   Cervical radicular pain 07/15/2018   Overweight (BMI 25.0-29.9) 01/29/2018   Right hip pain 01/29/2018   Insulin resistance 02/28/2017   Low grade squamous intraepith lesion on cytologic smear cervix (lgsil) 02/28/2017   GERD without esophagitis 03/15/2016   Left ankle pain 03/15/2016   Allergic rhinitis, seasonal 05/21/2015   Scoliosis 05/21/2015   Arthralgia of temporomandibular joint 05/21/2015      Current Outpatient Medications:    azelastine (ASTELIN) 0.1 % nasal spray, Place 2 sprays into both nostrils 2 (two) times daily. Use in each nostril as directed, Disp: 30 mL, Rfl: 2   EPINEPHrine (EPIPEN 2-PAK) 0.3 mg/0.3 mL IJ SOAJ injection, USE AS DIRECTED, Disp: 2 each, Rfl: 0    estradiol-norethindrone (COMBIPATCH) 0.05-0.14 MG/DAY, Place 1 patch onto the skin twice a week, Disp: 8 patch, Rfl: 11   estrogen, conjugated,-medroxyprogesterone (PREMPRO) 0.625-5 MG tablet, Take 1 tablet by mouth once daily, Disp: 28 tablet, Rfl: 11   levocetirizine (XYZAL) 5 MG tablet, Take 1 tablet (5 mg total) by mouth every evening., Disp: 90 tablet, Rfl: 1   montelukast (SINGULAIR) 10 MG tablet, TAKE 1 TABLET BY MOUTH AT BEDTIME., Disp: 90 tablet, Rfl: 3   omeprazole (PRILOSEC) 40 MG capsule, TAKE 1 CAPSULE BY MOUTH DAILY., Disp: 90 capsule, Rfl: 1   triamcinolone (NASACORT) 55 MCG/ACT AERO nasal inhaler, 2 spray in each nostril daily, Disp: 16.9 mL, Rfl: 11   venlafaxine XR (EFFEXOR XR) 37.5 MG 24 hr capsule, Take 1 capsule (37.5 mg total) by mouth daily with breakfast., Disp: 30 capsule, Rfl: 0   venlafaxine XR (EFFEXOR XR) 75 MG 24 hr capsule, Take 1 capsule (75 mg total) by mouth daily with breakfast., Disp: 90 capsule, Rfl: 1   Allergies  Allergen Reactions   Codeine Nausea And Vomiting     Social History   Tobacco Use   Smoking status: Never   Smokeless tobacco: Never  Vaping Use   Vaping Use: Never used  Substance Use Topics   Alcohol use: Yes    Alcohol/week: 1.0 standard drink of alcohol    Types: 1 Glasses of wine per week   Drug use: No  Chart Review Today: I personally reviewed active problem list, medication list, allergies, family history, social history, health maintenance, notes from last encounter, lab results, imaging with the patient/caregiver today.   Review of Systems  Constitutional: Negative.   HENT: Negative.    Eyes: Negative.   Respiratory: Negative.    Cardiovascular: Negative.   Gastrointestinal: Negative.   Endocrine: Negative.   Genitourinary: Negative.   Musculoskeletal: Negative.   Skin: Negative.   Allergic/Immunologic: Negative.   Neurological: Negative.   Hematological: Negative.   Psychiatric/Behavioral: Negative.     All other systems reviewed and are negative.      Objective:   Vitals:   11/10/22 0814  BP: 114/68  Pulse: 86  Resp: 16  Temp: 98.3 F (36.8 C)  TempSrc: Oral  SpO2: 99%  Weight: 152 lb 4.8 oz (69.1 kg)  Height: '5\' 3"'$  (1.6 m)    Body mass index is 26.98 kg/m.  Physical Exam Vitals and nursing note reviewed.  Constitutional:      General: She is not in acute distress.    Appearance: Normal appearance. She is well-developed. She is not ill-appearing, toxic-appearing or diaphoretic.  HENT:     Head: Normocephalic and atraumatic.     Right Ear: Hearing, tympanic membrane, ear canal and external ear normal.     Left Ear: Hearing, tympanic membrane, ear canal and external ear normal.     Nose: Mucosal edema, congestion and rhinorrhea present.     Right Turbinates: Enlarged and swollen.     Left Turbinates: Enlarged and swollen.     Right Sinus: No maxillary sinus tenderness or frontal sinus tenderness.     Left Sinus: No maxillary sinus tenderness or frontal sinus tenderness.     Comments: Scant amount of blood in right nare, no active bleeding Mucosa diffusely edematous and erythematous    Mouth/Throat:     Pharynx: Oropharynx is clear. Uvula midline. No pharyngeal swelling, oropharyngeal exudate or uvula swelling.  Eyes:     General:        Right eye: No discharge.        Left eye: No discharge.     Conjunctiva/sclera: Conjunctivae normal.  Neck:     Trachea: No tracheal deviation.  Cardiovascular:     Rate and Rhythm: Normal rate and regular rhythm.  Pulmonary:     Effort: Pulmonary effort is normal. No respiratory distress.     Breath sounds: No stridor.  Musculoskeletal:        General: Normal range of motion.  Lymphadenopathy:     Head:     Right side of head: No submental, submandibular or tonsillar adenopathy.     Left side of head: No submental, submandibular or tonsillar adenopathy.     Cervical: No cervical adenopathy.  Skin:    General: Skin is warm  and dry.     Findings: No rash.  Neurological:     Mental Status: She is alert.     Motor: No abnormal muscle tone.     Coordination: Coordination normal.  Psychiatric:        Behavior: Behavior normal.      Results for orders placed or performed in visit on 11/01/22  POCT Urinalysis Dipstick  Result Value Ref Range   Color, UA yellow    Clarity, UA clear    Glucose, UA Negative Negative   Bilirubin, UA neg    Ketones, UA neg    Spec Grav, UA 1.020 1.010 - 1.025   Blood, UA  neg    pH, UA 6.5 5.0 - 8.0   Protein, UA Negative Negative   Urobilinogen, UA 0.2 0.2 or 1.0 E.U./dL   Nitrite, UA neg    Leukocytes, UA Negative Negative   Appearance clear    Odor normal        Assessment & Plan:     ICD-10-CM   1. Rhinosinusitis  J32.9 triamcinolone (NASACORT) 55 MCG/ACT AERO nasal inhaler   worse sx for 2-3 week w/o weekly allergy shot around holidays, increase allergy med compliance, f/up if not improving in 1-2 weeks    2. Epistaxis  R04.0    scant amount of blood visible to right nare - no active bleeding, increase use of her current meds, add hydration, f/up ENT if not improving     Strongly encourage patient to resume and more regularly take all of her allergy and sinus medications I represcribed for her Nasacort encouraged her to use daily and decrease to every other day if it causes any irritation also strongly encouraged her to add moisture and hydration to the area at home and use saline spray   if any worsening asked pt to msg [place] may need abx called in Today no sinus ttp  Work note was given to excuse her from work today and tomorrow She asked for a work note through next Tuesday but I explained that this is not medically indicated I encouraged her to follow-up with [place] Monday if she has any new or worsening symptoms   Delsa Grana, PA-C 11/10/22 8:37 AM

## 2022-11-13 ENCOUNTER — Ambulatory Visit (INDEPENDENT_AMBULATORY_CARE_PROVIDER_SITE_OTHER): Payer: BC Managed Care – PPO | Admitting: Physician Assistant

## 2022-11-13 ENCOUNTER — Encounter: Payer: Self-pay | Admitting: Physician Assistant

## 2022-11-13 ENCOUNTER — Telehealth: Payer: Self-pay | Admitting: Family Medicine

## 2022-11-13 VITALS — BP 122/80 | HR 87 | Temp 98.4°F | Resp 16 | Ht 63.0 in | Wt 150.1 lb

## 2022-11-13 DIAGNOSIS — J301 Allergic rhinitis due to pollen: Secondary | ICD-10-CM | POA: Diagnosis not present

## 2022-11-13 DIAGNOSIS — R490 Dysphonia: Secondary | ICD-10-CM

## 2022-11-13 NOTE — Telephone Encounter (Signed)
Copied from [place] [id number]. Topic: General - Other >> Nov 13, 2022  9:53 AM Makayla Mason wrote: Patient was seen in office on 11/10/22. She is requesting an out of work note for today, 11/13/22. Patient would like to stop by the office this morning to pick it up.Pease advise.

## 2022-11-13 NOTE — Telephone Encounter (Signed)
Per Makayla Mason note- Work note was given to excuse her from work today and tomorrow She asked for a work note through next Tuesday but I explained that this is not medically indicated I encouraged her to follow-up with [place] Monday if she has any new or worsening symptoms.   Also spoke to [place] in person

## 2022-11-13 NOTE — Progress Notes (Signed)
Acute Office Visit   Patient: Makayla Mason   DOB: [birth year]   57 y.o. Female  MRN: [id number] Visit Date: 11/13/2022  Today's healthcare provider: Dani Gobble Clennon Nasca, PA-C  Introduced myself to the patient as a Journalist, newspaper and provided education on APPs in clinical practice.    Chief Complaint  Patient presents with   Follow-up    Pt states lost her voice but feeling better   Subjective    HPI HPI     Follow-up    Additional comments: Pt states lost her voice but feeling better      Last edited by Salomon Fick, [place] on 11/13/2022 11:05 AM.       Concern for voice hoarseness and follow up for Rhinitis    She states she has had postnasal drainage and loss of voice Drainage started Sunday - 11/12/22  She reports she got her allergy shot today  She thinks her voice loss has gotten worse Interventions: nasal spray from Friday and saline spray      Medications: Outpatient Medications Prior to Visit  Medication Sig   azelastine (ASTELIN) 0.1 % nasal spray Place 2 sprays into both nostrils 2 (two) times daily. Use in each nostril as directed   EPINEPHrine (EPIPEN 2-PAK) 0.3 mg/0.3 mL IJ SOAJ injection USE AS DIRECTED   estradiol-norethindrone (COMBIPATCH) 0.05-0.14 MG/DAY Place 1 patch onto the skin twice a week   estrogen, conjugated,-medroxyprogesterone (PREMPRO) 0.625-5 MG tablet Take 1 tablet by mouth once daily   levocetirizine (XYZAL) 5 MG tablet Take 1 tablet (5 mg total) by mouth every evening.   montelukast (SINGULAIR) 10 MG tablet TAKE 1 TABLET BY MOUTH AT BEDTIME.   omeprazole (PRILOSEC) 40 MG capsule TAKE 1 CAPSULE BY MOUTH DAILY.   triamcinolone (NASACORT) 55 MCG/ACT AERO nasal inhaler 2 spray in each nostril daily   venlafaxine XR (EFFEXOR XR) 37.5 MG 24 hr capsule Take 1 capsule (37.5 mg total) by mouth daily with breakfast.   venlafaxine XR (EFFEXOR XR) 75 MG 24 hr capsule Take 1 capsule (75 mg total) by mouth daily with breakfast.   No  facility-administered medications prior to visit.    Review of Systems  Constitutional:  Negative for chills and fever.  HENT:  Positive for postnasal drip and voice change. Negative for trouble swallowing.   Respiratory:  Positive for cough. Negative for choking.   Gastrointestinal:  Negative for diarrhea, nausea and vomiting.       Denies heartburn symptoms        Objective    BP 122/80   Pulse 87   Temp 98.4 F (36.9 C) (Oral)   Resp 16   Ht '5\' 3"'$  (1.6 m)   Wt 150 lb 1.6 oz (68.1 kg)   SpO2 99%   BMI 26.59 kg/m    Physical Exam Vitals reviewed.  Constitutional:      Appearance: Normal appearance.  HENT:     Head: Normocephalic and atraumatic.     Mouth/Throat:     Lips: Pink.     Mouth: Mucous membranes are moist. No lacerations or oral lesions.     Pharynx: Oropharynx is clear. Uvula midline. Posterior oropharyngeal erythema present. No pharyngeal swelling, oropharyngeal exudate or uvula swelling.  Neck:     Comments: Voice is very hoarse and muffled  Pulmonary:     Effort: Pulmonary effort is normal.  Musculoskeletal:     Cervical back: Normal range of motion and neck supple.  Lymphadenopathy:     Head:     Right side of head: No submental, submandibular or preauricular adenopathy.     Left side of head: No submental, submandibular or preauricular adenopathy.     Cervical:     Right cervical: No superficial or posterior cervical adenopathy.    Left cervical: No superficial or posterior cervical adenopathy.     Comments: Tenderness to jaw line on palpation of salivary glands   Neurological:     General: No focal deficit present.     Mental Status: She is alert and oriented to person, place, and time.  Psychiatric:        Attention and Perception: Attention and perception normal.        Mood and Affect: Mood normal.        Speech: Speech normal.        Behavior: Behavior normal. Behavior is cooperative.        Thought Content: Thought content normal.         Judgment: Judgment normal.       No results found for any visits on 11/13/22.  Assessment & Plan      No follow-ups on file.       Problem List Items Addressed This Visit   None Visit Diagnoses     Soft hoarse voice    -  Primary Acute, ongoing concern Reports hoarse voice and loss of voice over the weekend after recent illness Mild erythema in posterior oropharynx is present but no swelling or exudates on exam Recommend warm salt water gargles, warm tea with honey and voice rest as tolerated Follow up as needed for persistent or progressing symptoms         No follow-ups on file.   I, Momodou Consiglio E Preslyn Warr, PA-C, have reviewed all documentation for this visit. The documentation on 11/13/22 for the exam, diagnosis, procedures, and orders are all accurate and complete.   Talitha Givens, MHS, PA-C [facility]

## 2022-11-17 DIAGNOSIS — J301 Allergic rhinitis due to pollen: Secondary | ICD-10-CM | POA: Diagnosis not present

## 2022-11-20 DIAGNOSIS — J301 Allergic rhinitis due to pollen: Secondary | ICD-10-CM | POA: Diagnosis not present

## 2022-11-22 DIAGNOSIS — H5203 Hypermetropia, bilateral: Secondary | ICD-10-CM | POA: Diagnosis not present

## 2022-11-22 DIAGNOSIS — H40013 Open angle with borderline findings, low risk, bilateral: Secondary | ICD-10-CM | POA: Diagnosis not present

## 2022-11-22 DIAGNOSIS — H1045 Other chronic allergic conjunctivitis: Secondary | ICD-10-CM | POA: Diagnosis not present

## 2022-11-27 DIAGNOSIS — J301 Allergic rhinitis due to pollen: Secondary | ICD-10-CM | POA: Diagnosis not present

## 2022-12-02 ENCOUNTER — Other Ambulatory Visit: Payer: Self-pay

## 2022-12-04 ENCOUNTER — Other Ambulatory Visit: Payer: Self-pay

## 2022-12-04 DIAGNOSIS — J301 Allergic rhinitis due to pollen: Secondary | ICD-10-CM | POA: Diagnosis not present

## 2022-12-08 ENCOUNTER — Other Ambulatory Visit: Payer: Self-pay

## 2022-12-08 ENCOUNTER — Other Ambulatory Visit: Payer: Self-pay | Admitting: Family Medicine

## 2022-12-08 DIAGNOSIS — J302 Other seasonal allergic rhinitis: Secondary | ICD-10-CM

## 2022-12-08 MED FILL — Montelukast Sodium Tab 10 MG (Base Equiv): ORAL | 90 days supply | Qty: 90 | Fill #1 | Status: AC

## 2022-12-08 MED FILL — Levocetirizine Dihydrochloride Tab 5 MG: ORAL | 90 days supply | Qty: 90 | Fill #0 | Status: AC

## 2022-12-08 MED FILL — Omeprazole Cap Delayed Release 40 MG: ORAL | 90 days supply | Qty: 90 | Fill #1 | Status: AC

## 2022-12-10 ENCOUNTER — Other Ambulatory Visit: Payer: Self-pay

## 2022-12-11 ENCOUNTER — Other Ambulatory Visit: Payer: Self-pay

## 2022-12-11 DIAGNOSIS — J301 Allergic rhinitis due to pollen: Secondary | ICD-10-CM | POA: Diagnosis not present

## 2022-12-18 ENCOUNTER — Telehealth: Payer: Self-pay | Admitting: Family Medicine

## 2022-12-18 DIAGNOSIS — J301 Allergic rhinitis due to pollen: Secondary | ICD-10-CM | POA: Diagnosis not present

## 2022-12-18 NOTE — Telephone Encounter (Signed)
Cpmpleted. Patient aware.

## 2022-12-18 NOTE — Telephone Encounter (Signed)
Pt is calling to request a print out of her lab, BP, Cholesterol, Height & Weight. Pt would like this to be printed for her and available for pickup by 10;30a. Please advise CB- [phone]

## 2022-12-21 ENCOUNTER — Other Ambulatory Visit (HOSPITAL_COMMUNITY): Payer: Self-pay

## 2022-12-25 ENCOUNTER — Other Ambulatory Visit: Payer: Self-pay

## 2022-12-25 DIAGNOSIS — J301 Allergic rhinitis due to pollen: Secondary | ICD-10-CM | POA: Diagnosis not present

## 2022-12-25 MED ORDER — EPINEPHRINE 0.3 MG/0.3ML IJ SOAJ
0.3000 mg | INTRAMUSCULAR | 0 refills | Status: DC
  Filled 2022-12-25: qty 2, 30d supply, fill #0

## 2023-01-01 ENCOUNTER — Other Ambulatory Visit: Payer: Self-pay

## 2023-01-01 DIAGNOSIS — J301 Allergic rhinitis due to pollen: Secondary | ICD-10-CM | POA: Diagnosis not present

## 2023-01-03 ENCOUNTER — Other Ambulatory Visit: Payer: Self-pay

## 2023-01-08 ENCOUNTER — Other Ambulatory Visit: Payer: Self-pay

## 2023-01-08 DIAGNOSIS — J301 Allergic rhinitis due to pollen: Secondary | ICD-10-CM | POA: Diagnosis not present

## 2023-01-15 DIAGNOSIS — J301 Allergic rhinitis due to pollen: Secondary | ICD-10-CM | POA: Diagnosis not present

## 2023-01-18 ENCOUNTER — Ambulatory Visit: Payer: BC Managed Care – PPO | Admitting: Obstetrics and Gynecology

## 2023-01-22 DIAGNOSIS — J301 Allergic rhinitis due to pollen: Secondary | ICD-10-CM | POA: Diagnosis not present

## 2023-01-29 DIAGNOSIS — J301 Allergic rhinitis due to pollen: Secondary | ICD-10-CM | POA: Diagnosis not present

## 2023-02-05 DIAGNOSIS — J301 Allergic rhinitis due to pollen: Secondary | ICD-10-CM | POA: Diagnosis not present

## 2023-02-06 DIAGNOSIS — J301 Allergic rhinitis due to pollen: Secondary | ICD-10-CM | POA: Diagnosis not present

## 2023-02-12 DIAGNOSIS — J301 Allergic rhinitis due to pollen: Secondary | ICD-10-CM | POA: Diagnosis not present

## 2023-02-19 DIAGNOSIS — J301 Allergic rhinitis due to pollen: Secondary | ICD-10-CM | POA: Diagnosis not present

## 2023-02-20 NOTE — Progress Notes (Deleted)
PCP: Alba Cory, MD   No chief complaint on file.   HPI:      [name] is a 57 y.o. [id number] whose LMP was No LMP recorded. Patient is postmenopausal., presents today for her annual examination.  Her menses are {norm/abn:715}, lasting {number: 22536} days.  Dysmenorrhea {dysmen:716}. She {does:18564} have intermenstrual bleeding. On ERT??? She {does:18564} have vasomotor sx.   Sex activity: {sex active: 315163}. She {does:18564} have vaginal dryness.  Last Pap: 09/20/21  Results were: no abnormalities /neg HPV DNA 2021. Hx of LGSIL 2018. Hx of STDs: {STD hx:14358}  Last mammogram: 05/12/22  Results were: normal--routine follow-up in 12 months There is no FH of breast cancer. There is no FH of ovarian cancer. The patient {does:18564} do self-breast exams.  Colonoscopy: 2019 with polyp with [facility];  Repeat due after 10 years.   Tobacco use: {tob:20664} Alcohol use: {Alcohol:11675} No drug use Exercise: {exercise:31265}  She {does:18564} get adequate calcium and Vitamin D in her diet.  Labs with PCP.   Patient Active Problem List   Diagnosis Date Noted   Vasomotor symptoms due to menopause 08/11/2022   Sprain of ulnar collateral ligament of metacarpophalangeal (MCP) joint of right thumb 06/08/2020   Cervical radicular pain 07/15/2018   Overweight (BMI 25.0-29.9) 01/29/2018   Right hip pain 01/29/2018   Insulin resistance 02/28/2017   Low grade squamous intraepith lesion on cytologic smear cervix (lgsil) 02/28/2017   GERD without esophagitis 03/15/2016   Left ankle pain 03/15/2016   Allergic rhinitis, seasonal 05/21/2015   Scoliosis 05/21/2015   Arthralgia of temporomandibular joint 05/21/2015    Past Surgical History:  Procedure Laterality Date   COLONOSCOPY WITH PROPOFOL N/A 02/15/2018   Procedure: COLONOSCOPY WITH PROPOFOL;  Surgeon: Wyline Mood, MD;  Location: [facility] ENDOSCOPY;  Service: Gastroenterology;  Laterality: N/A;    Family History   Problem Relation Age of Onset   Breast cancer Cousin    Diabetes Mother    Heart murmur Mother    Diabetes Father    Hypertension Father    Asthma Daughter    Anemia Maternal Aunt    Stroke Maternal Aunt    Kidney disease Maternal Grandmother    Hypertension Maternal Grandmother    Heart failure Maternal Grandmother    Heart attack Maternal Grandfather     Social History   Socioeconomic History   Marital status: Divorced    Spouse name: Not on file   Number of children: 2   Years of education: Not on file   Highest education level: Bachelor's degree (e.g., BA, AB, BS)  Occupational History   Not on file  Tobacco Use   Smoking status: Never   Smokeless tobacco: Never  Vaping Use   Vaping Use: Never used  Substance and Sexual Activity   Alcohol use: Yes    Alcohol/week: 1.0 standard drink of alcohol    Types: 1 Glasses of wine per week   Drug use: No   Sexual activity: Not Currently    Birth control/protection: Post-menopausal  Other Topics Concern   Not on file  Social History Narrative   Lives in [place], grown daughters and grandson are at home   Social Determinants of Health   Financial Resource Strain: Medium Risk (08/11/2022)   Overall Financial Resource Strain (CARDIA)    Difficulty of Paying Living Expenses: Somewhat hard  Food Insecurity: Food Insecurity Present (08/11/2022)   Hunger Vital Sign    Worried About Running Out of Food in the Last  Year: Sometimes true    Ran Out of Food in the Last Year: Sometimes true  Transportation Needs: No Transportation Needs (08/11/2022)   PRAPARE - Administrator, Civil Service (Medical): No    Lack of Transportation (Non-Medical): No  Physical Activity: Insufficiently Active (08/11/2022)   Exercise Vital Sign    Days of Exercise per Week: 3 days    Minutes of Exercise per Session: 30 min  Stress: No Stress Concern Present (08/11/2022)   [employer] of Occupational Health - Occupational Stress  Questionnaire    Feeling of Stress : Only a little  Social Connections: Moderately Integrated (08/11/2022)   Social Connection and Isolation Panel [NHANES]    Frequency of Communication with Friends and Family: More than three times a week    Frequency of Social Gatherings with Friends and Family: More than three times a week    Attends Religious Services: More than 4 times per year    Active Member of [employer] or Organizations: Yes    Attends Banker Meetings: 1 to 4 times per year    Marital Status: Divorced  Intimate Partner Violence: Not At Risk (08/11/2022)   Humiliation, Afraid, Rape, and Kick questionnaire    Fear of Current or Ex-Partner: No    Emotionally Abused: No    Physically Abused: No    Sexually Abused: No     Current Outpatient Medications:    azelastine (ASTELIN) 0.1 % nasal spray, Place 2 sprays into both nostrils 2 (two) times daily. Use in each nostril as directed, Disp: 30 mL, Rfl: 2   EPINEPHrine (EPIPEN 2-PAK) 0.3 mg/0.3 mL IJ SOAJ injection, USE AS DIRECTED, Disp: 2 each, Rfl: 0   EPINEPHrine (EPIPEN 2-PAK) 0.3 mg/0.3 mL IJ SOAJ injection, Inject 0.3 mg into the muscle as directed., Disp: 2 each, Rfl: 0   estradiol-norethindrone (COMBIPATCH) 0.05-0.14 MG/DAY, Place 1 patch onto the skin twice a week, Disp: 8 patch, Rfl: 11   estrogen, conjugated,-medroxyprogesterone (PREMPRO) 0.625-5 MG tablet, Take 1 tablet by mouth once daily, Disp: 28 tablet, Rfl: 11   levocetirizine (XYZAL) 5 MG tablet, Take 1 tablet (5 mg total) by mouth every evening., Disp: 90 tablet, Rfl: 1   montelukast (SINGULAIR) 10 MG tablet, TAKE 1 TABLET BY MOUTH AT BEDTIME., Disp: 90 tablet, Rfl: 3   omeprazole (PRILOSEC) 40 MG capsule, TAKE 1 CAPSULE BY MOUTH DAILY., Disp: 90 capsule, Rfl: 1   triamcinolone (NASACORT) 55 MCG/ACT AERO nasal inhaler, 2 spray in each nostril daily, Disp: 16.9 mL, Rfl: 11   venlafaxine XR (EFFEXOR XR) 37.5 MG 24 hr capsule, Take 1 capsule (37.5 mg total) by  mouth daily with breakfast., Disp: 30 capsule, Rfl: 0   venlafaxine XR (EFFEXOR XR) 75 MG 24 hr capsule, Take 1 capsule (75 mg total) by mouth daily with breakfast., Disp: 90 capsule, Rfl: 1     ROS:  Review of Systems BREAST: No symptoms    Objective: There were no vitals taken for this visit.   OBGyn Exam  Results: No results found for this or any previous visit (from the past 24 hour(s)).  Assessment/Plan:  No diagnosis found.   No orders of the defined types were placed in this encounter.           GYN counsel {counseling: 16159}    F/U  No follow-ups on file.  Sharnee Douglass B. Welda Azzarello, PA-C 02/20/2023 4:03 PM

## 2023-02-22 ENCOUNTER — Ambulatory Visit: Payer: BC Managed Care – PPO | Admitting: Obstetrics and Gynecology

## 2023-02-22 DIAGNOSIS — Z1231 Encounter for screening mammogram for malignant neoplasm of breast: Secondary | ICD-10-CM

## 2023-02-22 DIAGNOSIS — Z124 Encounter for screening for malignant neoplasm of cervix: Secondary | ICD-10-CM

## 2023-02-22 DIAGNOSIS — Z01419 Encounter for gynecological examination (general) (routine) without abnormal findings: Secondary | ICD-10-CM

## 2023-02-22 DIAGNOSIS — Z8742 Personal history of other diseases of the female genital tract: Secondary | ICD-10-CM

## 2023-02-22 DIAGNOSIS — N951 Menopausal and female climacteric states: Secondary | ICD-10-CM

## 2023-02-26 DIAGNOSIS — J301 Allergic rhinitis due to pollen: Secondary | ICD-10-CM | POA: Diagnosis not present

## 2023-03-02 ENCOUNTER — Ambulatory Visit: Payer: Self-pay | Admitting: *Deleted

## 2023-03-02 NOTE — Telephone Encounter (Signed)
Reason for Disposition  [1] Chest pain lasts > 5 minutes AND [2] occurred > 3 days ago (72 hours) AND [3] NO chest pain or cardiac symptoms now  Answer Assessment - Initial Assessment Questions 1. LOCATION: "Where does it hurt?"       I had chest pain this morning.   It was on my right side of my chest.  No history of cardiac problems.   I saw a cardiologist who said my heart was fine.   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     No pain in upper back 2. RADIATION: "Does the pain go anywhere else?" (e.g., into neck, jaw, arms, back)     No 3. ONSET: "When did the chest pain begin?" (Minutes, hours or days)      This morning.    I had my head bent down looking at something on Facebook for a long time.    4. PATTERN: "Does the pain come and go, or has it been constant since it started?"  "Does it get worse with exertion?"      It just happened one time. 5. DURATION: "How long does it last" (e.g., seconds, minutes, hours)     It was short. 6. SEVERITY: "How bad is the pain?"  (e.g., Scale 1-10; mild, moderate, or severe)    - MILD (1-3): doesn't interfere with normal activities     - MODERATE (4-7): interferes with normal activities or awakens from sleep    - SEVERE (8-10): excruciating pain, unable to do any normal activities       Mild   7. CARDIAC RISK FACTORS: "Do you have any history of heart problems or risk factors for heart disease?" (e.g., angina, prior heart attack; diabetes, high blood pressure, high cholesterol, smoker, or strong family history of heart disease)     No everything was fine. 8. PULMONARY RISK FACTORS: "Do you have any history of lung disease?"  (e.g., blood clots in lung, asthma, emphysema, birth control pills)     No 9. CAUSE: "What do you think is causing the chest pain?"     Not asked 10. OTHER SYMPTOMS: "Do you have any other symptoms?" (e.g., dizziness, nausea, vomiting, sweating, fever, difficulty breathing, cough)       No nausea or referred pain.  Denies any other symptoms. 11.  PREGNANCY: "Is there any chance you are pregnant?" "When was your last menstrual period?"       N/A due to pain.  Protocols used: Chest Pain-A-AH

## 2023-03-02 NOTE — Telephone Encounter (Addendum)
  Chief Complaint: chest pain Symptoms: Had chest pain on right side of her chest briefly one time this morning.  Was looking at [facility] when it happened.   My head was bent down for a long time. Frequency: Once this morning Pertinent Negatives: Patient denies shortness of breath, dizziness, chest pain now, breaking out in a sweat, no pain in upper back, either arm or shoulder, or neck or jaw.   She does have TMJ so hard to tell about neck or jaw pain. Disposition: [] ED /[] Urgent Care (no appt availability in office) / [x] Appointment(In office/virtual)/ []  [facility]/ [] Home Care/ [] Refused Recommended Disposition /[] [facility]/ []  Follow-up with PCP Additional Notes: Per protocol needs to be seen within 24 hrs.   No appts available at [facility] until end of May.   I'm sending a message to see if she can be worked in. I instructed her to go to the ED or call 911 if she experiences any of the symptoms listed above that she denied having this morning.   She verbalized understanding and was agreeable to someone calling her back about possibly being worked in.     I called into [facility] and they are going to call her to set up an appt. With Dr Carlynn Purl.

## 2023-03-02 NOTE — Telephone Encounter (Signed)
Spoke with pt and per [facility] pt should go to the ER. Pt asked even if it is mild and I did inform her yes, because everyone symptoms are different and you do not want to play with chest pains. Pt verbalized understanding.

## 2023-03-04 ENCOUNTER — Emergency Department: Payer: BC Managed Care – PPO

## 2023-03-04 ENCOUNTER — Other Ambulatory Visit: Payer: Self-pay

## 2023-03-04 ENCOUNTER — Emergency Department
Admission: EM | Admit: 2023-03-04 | Discharge: 2023-03-04 | Disposition: A | Discharge status: Home / Self Care | Payer: BC Managed Care – PPO | Attending: Emergency Medicine | Admitting: Emergency Medicine

## 2023-03-04 DIAGNOSIS — R079 Chest pain, unspecified: Secondary | ICD-10-CM | POA: Diagnosis not present

## 2023-03-04 DIAGNOSIS — R0789 Other chest pain: Secondary | ICD-10-CM | POA: Diagnosis not present

## 2023-03-04 LAB — BASIC METABOLIC PANEL
Anion gap: 7 (ref 5–15)
BUN: 14 mg/dL (ref 6–20)
CO2: 27 mmol/L (ref 22–32)
Calcium: 9.3 mg/dL (ref 8.9–10.3)
Chloride: 106 mmol/L (ref 98–111)
Creatinine, Ser: 0.63 mg/dL (ref 0.44–1.00)
GFR, Estimated: >60 mL/min (ref >60)
Glucose, Bld: 88 mg/dL (ref 70–99)
Potassium: 3.8 mmol/L (ref 3.5–5.1)
Sodium: 140 mmol/L (ref 135–145)

## 2023-03-04 LAB — CBC
HCT: 42.2 % (ref 36.0–46.0)
Hemoglobin: 13.4 g/dL (ref 12.0–15.0)
MCH: 28.4 pg (ref 26.0–34.0)
MCHC: 31.8 g/dL (ref 30.0–36.0)
MCV: 89.4 fL (ref 80.0–100.0)
Platelets: 342 10*3/uL (ref 150–400)
RBC: 4.72 MIL/uL (ref 3.87–5.11)
RDW: 14.4 % (ref 11.5–15.5)
WBC: 9.4 10*3/uL (ref 4.0–10.5)
nRBC: 0 % (ref 0.0–0.2)

## 2023-03-04 LAB — TROPONIN I (HIGH SENSITIVITY): Troponin I (High Sensitivity): <2 ng/L (ref <18)

## 2023-03-04 NOTE — ED Provider Notes (Signed)
[facility] Provider Note    Event Date/Time   First MD Initiated Contact with Patient 03/04/23 2036     (approximate)   History   Chest Pain   HPI  Makayla Mason is a 57 y.o. female past medical history of allergies who presents because of chest pain.  Patient tells me that for about a week and a half she has had intermittent chest pain.  She has difficulty describing whether it is pressure or sharp but says it is just a coming and going sensation in her chest.  Last for about minutes at a time is not associate with dyspnea nausea or diaphoresis.  Is not brought on by exertion or deep breathing.  She is feeling about twice a day.  Felt that around 11 AM and then around 3 PM today.  She is not having chest pain currently.The patient denies hx of prior DVT/PE, unilateral leg pain/swelling, hormone use, recent surgery, hx of cancer, prolonged immobilization, or hemoptysis.    Denies personal history of cardiac disease hypertension hyperlipidemia diabetes or smoking.     Past Medical History:  Diagnosis Date   Allergic rhinitis, seasonal    Mild scoliosis    TMJ arthralgia    right side    Patient Active Problem List   Diagnosis Date Noted   Vasomotor symptoms due to menopause 08/11/2022   Sprain of ulnar collateral ligament of metacarpophalangeal (MCP) joint of right thumb 06/08/2020   Cervical radicular pain 07/15/2018   Overweight (BMI 25.0-29.9) 01/29/2018   Right hip pain 01/29/2018   Insulin resistance 02/28/2017   Low grade squamous intraepith lesion on cytologic smear cervix (lgsil) 02/28/2017   GERD without esophagitis 03/15/2016   Left ankle pain 03/15/2016   Allergic rhinitis, seasonal 05/21/2015   Scoliosis 05/21/2015   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      Excl. in GC?     Most recent vital signs: Vitals:   03/04/23 1828  BP: 137/79  Pulse: 88  Resp: 18  Temp: 98.5 F (36.9 C)  SpO2: 100%     General: Awake, no distress.  CV:  Good peripheral perfusion.  No pitting edema Resp:  Normal effort.  Lung sounds are clear Abd:  No distention.  Neuro:             Awake, Alert, Oriented x 3  Other:     ED Results / Procedures / Treatments  Labs (all labs ordered are listed, but only abnormal results are displayed) Labs Reviewed  BASIC METABOLIC PANEL  CBC  TROPONIN I (HIGH SENSITIVITY)     EKG  EKG reviewed inter myself shows sinus rhythm with short PR interval, inverted T wave in aVF and lead III, biphasic T waves V3 through V6   RADIOLOGY I reviewed and interpreted the CXR  which does not show any acute cardiopulmonary process    PROCEDURES:  Critical Care performed: No  Procedures    MEDICATIONS ORDERED IN ED: Medications - No data to display   IMPRESSION / MDM / ASSESSMENT AND PLAN / ED COURSE  I reviewed the triage vital signs and the nursing notes.                              Patient's presentation is most consistent with acute presentation with potential threat to life or bodily function.  Differential diagnosis includes, but is not limited to, unstable angina, musculoskeletal pain, GERD, low suspicion for PE or dissection  The patient is a 57 year old female who is healthy who presents because of a week and a half of intermittent chest pain.  Her pain is atypical and that it is nonexertional last for minutes at a time and is without other associated symptoms.  She is feeling it about once per day twice this morning last around 3 PM.  She is chest pain-free currently.  She has no associated symptoms with it.  Her  EKG has 2 inversions in lead III and aVF which were present before.  Biphasic V6 today.  Patient's troponin is less than 2 this is greater than 3 hours from onset of symptoms that this rules out MI at this time.  However with her age and intermittent chest pain I do feel that she should be seen by cardiology and potentially have further workup specially given her abnormal EKG.  There are symptoms or not exertional and are not progressive so do not feel that she needs admission or emergent workup for unstable angina.  Did discuss her EKG findings with her.  I have placed cardiology referral.  We discussed return precautions for new or worsening chest pain.  Patient appropriate for discharge at this time.       FINAL CLINICAL IMPRESSION(S) / ED DIAGNOSES   Final diagnoses:  Chest pain, unspecified type     Rx / DC Orders   ED Discharge Orders          Ordered    Ambulatory referral to Cardiology       Comments: If you have not heard from the Cardiology office within the next 72 hours please call [phone].   03/04/23 2148             Note:  This document was prepared using Dragon voice recognition software and may include unintentional dictation errors.   Georga Hacking, MD 03/04/23 [phone]

## 2023-03-04 NOTE — Discharge Instructions (Addendum)
Your cardiac enzymes were negative.  Your EKG does have some abnormalities and I would like you to follow-up with cardiology.  If you do not hear from the office then please call the number above to schedule an appointment.  If your chest pain is worsening or lasting longer than typically or is brought on by exertion then please return to the emergency department.

## 2023-03-04 NOTE — ED Triage Notes (Signed)
Pt here with cp. Pt states pain has been ongoing for a month but stronger. Pt states pain is left and sometimes center but radiates to her left arm. Pt denies NVD.

## 2023-03-05 DIAGNOSIS — J301 Allergic rhinitis due to pollen: Secondary | ICD-10-CM | POA: Diagnosis not present

## 2023-03-09 DIAGNOSIS — R079 Chest pain, unspecified: Secondary | ICD-10-CM | POA: Diagnosis not present

## 2023-03-09 DIAGNOSIS — R011 Cardiac murmur, unspecified: Secondary | ICD-10-CM | POA: Diagnosis not present

## 2023-03-09 DIAGNOSIS — R0602 Shortness of breath: Secondary | ICD-10-CM | POA: Diagnosis not present

## 2023-03-09 DIAGNOSIS — K219 Gastro-esophageal reflux disease without esophagitis: Secondary | ICD-10-CM | POA: Diagnosis not present

## 2023-03-12 DIAGNOSIS — J301 Allergic rhinitis due to pollen: Secondary | ICD-10-CM | POA: Diagnosis not present

## 2023-03-19 ENCOUNTER — Other Ambulatory Visit: Payer: Self-pay

## 2023-03-19 ENCOUNTER — Other Ambulatory Visit: Payer: Self-pay | Admitting: Internal Medicine

## 2023-03-19 DIAGNOSIS — R079 Chest pain, unspecified: Secondary | ICD-10-CM

## 2023-03-19 DIAGNOSIS — J301 Allergic rhinitis due to pollen: Secondary | ICD-10-CM | POA: Diagnosis not present

## 2023-03-19 MED FILL — Levocetirizine Dihydrochloride Tab 5 MG: ORAL | 90 days supply | Qty: 90 | Fill #1 | Status: AC

## 2023-03-19 MED FILL — Montelukast Sodium Tab 10 MG (Base Equiv): ORAL | 90 days supply | Qty: 90 | Fill #2 | Status: AC

## 2023-03-23 DIAGNOSIS — R079 Chest pain, unspecified: Secondary | ICD-10-CM | POA: Diagnosis not present

## 2023-03-23 DIAGNOSIS — R011 Cardiac murmur, unspecified: Secondary | ICD-10-CM | POA: Diagnosis not present

## 2023-03-26 DIAGNOSIS — J301 Allergic rhinitis due to pollen: Secondary | ICD-10-CM | POA: Diagnosis not present

## 2023-04-09 DIAGNOSIS — J301 Allergic rhinitis due to pollen: Secondary | ICD-10-CM | POA: Diagnosis not present

## 2023-04-13 ENCOUNTER — Other Ambulatory Visit: Payer: Self-pay

## 2023-04-13 ENCOUNTER — Encounter (HOSPITAL_COMMUNITY): Payer: Self-pay

## 2023-04-13 ENCOUNTER — Telehealth (HOSPITAL_COMMUNITY): Payer: Self-pay | Admitting: Emergency Medicine

## 2023-04-13 DIAGNOSIS — R079 Chest pain, unspecified: Secondary | ICD-10-CM

## 2023-04-13 MED ORDER — IVABRADINE HCL 7.5 MG PO TABS
15.0000 mg | ORAL_TABLET | Freq: Once | ORAL | 0 refills | Status: AC
  Filled 2023-04-13: qty 2, 1d supply, fill #0

## 2023-04-13 NOTE — Telephone Encounter (Signed)
Reaching out to patient to offer assistance regarding upcoming cardiac imaging study; pt verbalizes understanding of appt date/time, parking situation and where to check in, pre-test NPO status and medications ordered, and verified current allergies; name and call back number provided for further questions should they arise Jaliel Deavers RN Navigator Cardiac Imaging [facility] [phone] office [phone] cell   15mg ivabradine  

## 2023-04-16 ENCOUNTER — Other Ambulatory Visit: Payer: Self-pay | Admitting: Family Medicine

## 2023-04-16 ENCOUNTER — Encounter: Payer: Self-pay | Admitting: Family Medicine

## 2023-04-16 ENCOUNTER — Ambulatory Visit
Admission: RE | Admit: 2023-04-16 | Discharge: 2023-04-16 | Disposition: A | Discharge status: Home / Self Care | Payer: BC Managed Care – PPO | Source: Ambulatory Visit | Attending: Internal Medicine | Admitting: Internal Medicine

## 2023-04-16 DIAGNOSIS — J301 Allergic rhinitis due to pollen: Secondary | ICD-10-CM | POA: Diagnosis not present

## 2023-04-16 DIAGNOSIS — R079 Chest pain, unspecified: Secondary | ICD-10-CM | POA: Insufficient documentation

## 2023-04-16 DIAGNOSIS — Z1231 Encounter for screening mammogram for malignant neoplasm of breast: Secondary | ICD-10-CM

## 2023-04-16 MED ORDER — NITROGLYCERIN 0.4 MG SL SUBL
0.8000 mg | SUBLINGUAL_TABLET | Freq: Once | SUBLINGUAL | Status: AC
  Administered 2023-04-16: 0.8 mg via SUBLINGUAL

## 2023-04-16 MED ORDER — IOHEXOL 350 MG/ML SOLN
100.0000 mL | Freq: Once | INTRAVENOUS | Status: AC | PRN
  Administered 2023-04-16: 100 mL via INTRAVENOUS

## 2023-04-16 MED ORDER — METOPROLOL TARTRATE 5 MG/5ML IV SOLN
10.0000 mg | Freq: Once | INTRAVENOUS | Status: AC
  Administered 2023-04-16: 10 mg via INTRAVENOUS

## 2023-04-20 NOTE — Patient Instructions (Signed)
Preventive Care 40-59 Years Old, Female Preventive care refers to lifestyle choices and visits with your health care provider that can promote health and wellness. Preventive care visits are also called wellness exams. What can I expect for my preventive care visit? Counseling Your health care provider may ask you questions about your: Medical history, including: Past medical problems. Family medical history. Pregnancy history. Current health, including: Menstrual cycle. Method of birth control. Emotional well-being. Home life and relationship well-being. Sexual activity and sexual health. Lifestyle, including: Alcohol, nicotine or tobacco, and drug use. Access to firearms. Diet, exercise, and sleep habits. Work and work environment. Sunscreen use. Safety issues such as seatbelt and bike helmet use. Physical exam Your health care provider will check your: Height and weight. These may be used to calculate your BMI (body mass index). BMI is a measurement that tells if you are at a healthy weight. Waist circumference. This measures the distance around your waistline. This measurement also tells if you are at a healthy weight and may help predict your risk of certain diseases, such as type 2 diabetes and high blood pressure. Heart rate and blood pressure. Body temperature. Skin for abnormal spots. What immunizations do I need?  Vaccines are usually given at various ages, according to a schedule. Your health care provider will recommend vaccines for you based on your age, medical history, and lifestyle or other factors, such as travel or where you work. What tests do I need? Screening Your health care provider may recommend screening tests for certain conditions. This may include: Lipid and cholesterol levels. Diabetes screening. This is done by checking your blood sugar (glucose) after you have not eaten for a while (fasting). Pelvic exam and Pap test. Hepatitis B test. Hepatitis C  test. HIV (human immunodeficiency virus) test. STI (sexually transmitted infection) testing, if you are at risk. Lung cancer screening. Colorectal cancer screening. Mammogram. Talk with your health care provider about when you should start having regular mammograms. This may depend on whether you have a family history of breast cancer. BRCA-related cancer screening. This may be done if you have a family history of breast, ovarian, tubal, or peritoneal cancers. Bone density scan. This is done to screen for osteoporosis. Talk with your health care provider about your test results, treatment options, and if necessary, the need for more tests. Follow these instructions at home: Eating and drinking  Eat a diet that includes fresh fruits and vegetables, whole grains, lean protein, and low-fat dairy products. Take vitamin and mineral supplements as recommended by your health care provider. Do not drink alcohol if: Your health care provider tells you not to drink. You are pregnant, may be pregnant, or are planning to become pregnant. If you drink alcohol: Limit how much you have to 0-1 drink a day. Know how much alcohol is in your drink. In the [place] one drink equals one 12 oz bottle of beer (355 mL), one 5 oz glass of wine (148 mL), or one 1 oz glass of hard liquor (44 mL). Lifestyle Brush your teeth every morning and night with fluoride toothpaste. Floss one time each day. Exercise for at least 30 minutes 5 or more days each week. Do not use any products that contain nicotine or tobacco. These products include cigarettes, chewing tobacco, and vaping devices, such as e-cigarettes. If you need help quitting, ask your health care provider. Do not use drugs. If you are sexually active, practice safe sex. Use a condom or other form of protection to   prevent STIs. If you do not wish to become pregnant, use a form of birth control. If you plan to become pregnant, see your health care provider for a  prepregnancy visit. Take aspirin only as told by your health care provider. Make sure that you understand how much to take and what form to take. Work with your health care provider to find out whether it is safe and beneficial for you to take aspirin daily. Find healthy ways to manage stress, such as: Meditation, yoga, or listening to music. Journaling. Talking to a trusted person. Spending time with friends and family. Minimize exposure to UV radiation to reduce your risk of skin cancer. Safety Always wear your seat belt while driving or riding in a vehicle. Do not drive: If you have been drinking alcohol. Do not ride with someone who has been drinking. When you are tired or distracted. While texting. If you have been using any mind-altering substances or drugs. Wear a helmet and other protective equipment during sports activities. If you have firearms in your house, make sure you follow all gun safety procedures. Seek help if you have been physically or sexually abused. What's next? Visit your health care provider once a year for an annual wellness visit. Ask your health care provider how often you should have your eyes and teeth checked. Stay up to date on all vaccines. This information is not intended to replace advice given to you by your health care provider. Make sure you discuss any questions you have with your health care provider. Document Revised: 04/20/2021 Document Reviewed: 04/20/2021 Elsevier Patient Education  2024 [employer] Breast Self-Awareness Breast self-awareness is knowing how your breasts look and feel. You need to: Check your breasts on a regular basis. Tell your doctor about any changes. Become familiar with the look and feel of your breasts. This can help you catch a breast problem while it is still small and can be treated. You should do breast self-exams even if you have breast implants. What you need: A mirror. A well-lit room. A pillow or other  soft object. How to do a breast self-exam Follow these steps to do a breast self-exam: Look for changes  Take off all the clothes above your waist. Stand in front of a mirror in a room with good lighting. Put your hands down at your sides. Compare your breasts in the mirror. Look for any difference between them, such as: A difference in shape. A difference in size. Wrinkles, dips, and bumps in one breast and not the other. Look at each breast for changes in the skin, such as: Redness. Scaly areas. Skin that has gotten thicker. Dimpling. Open sores (ulcers). Look for changes in your nipples, such as: Fluid coming out of a nipple. Fluid around a nipple. Bleeding. Dimpling. Redness. A nipple that looks pushed in (retracted), or that has changed position. Feel for changes Lie on your back. Feel each breast. To do this: Pick a breast to feel. Place a pillow under the shoulder closest to that breast. Put the arm closest to that breast behind your head. Feel the nipple area of that breast using the hand of your other arm. Feel the area with the pads of your three middle fingers by making small circles with your fingers. Use light, medium, and firm pressure. Continue the overlapping circles, moving downward over the breast. Keep making circles with your fingers. Stop when you feel your ribs. Start making circles with your fingers again, this time going   upward until you reach your collarbone. Then, make circles outward across your breast and into your armpit area. Squeeze your nipple. Check for discharge and lumps. Repeat these steps to check your other breast. Sit or stand in the tub or shower. With soapy water on your skin, feel each breast the same way you did when you were lying down. Write down what you find Writing down what you find can help you remember what to tell your doctor. Write down: What is normal for each breast. Any changes you find in each breast. These  include: The kind of changes you find. A tender or painful breast. Any lump you find. Write down its size and where it is. When you last had your monthly period (menstrual cycle). General tips If you are breastfeeding, the best time to check your breasts is after you feed your baby or after you use a breast pump. If you get monthly bleeding, the best time to check your breasts is 5-7 days after your monthly cycle ends. With time, you will become comfortable with the self-exam. You will also start to know if there are changes in your breasts. Contact a doctor if: You see a change in the shape or size of your breasts or nipples. You see a change in the skin of your breast or nipples, such as red or scaly skin. You have fluid coming from your nipples that is not normal. You find a new lump or thick area. You have breast pain. You have any concerns about your breast health. Summary Breast self-awareness includes looking for changes in your breasts and feeling for changes within your breasts. You should do breast self-awareness in front of a mirror in a well-lit room. If you get monthly periods (menstrual cycles), the best time to check your breasts is 5-7 days after your period ends. Tell your doctor about any changes you see in your breasts. Changes include changes in size, changes on the skin, painful or tender breasts, or fluid from your nipples that is not normal. This information is not intended to replace advice given to you by your health care provider. Make sure you discuss any questions you have with your health care provider. Document Revised: 03/30/2022 Document Reviewed: 08/25/2021 Elsevier Patient Education  2024 [employer]  

## 2023-04-20 NOTE — Progress Notes (Unsigned)
ANNUAL PREVENTATIVE CARE GYNECOLOGY  ENCOUNTER NOTE  Subjective:       Makayla Mason is a 57 y.o. [phone] female here for a routine annual gynecologic exam. The patient {is/is not/has never been:13135} sexually active. The patient is taking hormone replacement therapy. Patient denies post-menopausal vaginal bleeding. The patient wears seatbelts: {yes/no:311178}. The patient participates in regular exercise: {yes/no/not asked:9010}. Has the patient ever been transfused or tattooed?: {yes/no/not asked:9010}. The patient reports that there {is/is not:9024} domestic violence in her life.  Current complaints: 1.  ***    Gynecologic History No LMP recorded. Patient is postmenopausal. Contraception: post menopausal status Last Pap: 09/20/2021. Results were: normal Last mammogram: 05/12/2022. Results were: normal Last Colonoscopy: 02/15/2018: Next 10 years Last Dexa Scan: Never done   Obstetric History OB History  Gravida Para Term Preterm AB Living  2 2 2     2   SAB IAB Ectopic Multiple Live Births          2    # Outcome Date GA Lbr Len/2nd Weight Sex Delivery Anes PTL Lv  2 Term 1997    F Vag-Spont   LIV  1 Term 1995    F Vag-Spont   LIV    Past Medical History:  Diagnosis Date   Allergic rhinitis, seasonal    Mild scoliosis    TMJ arthralgia    right side    Family History  Problem Relation Age of Onset   Breast cancer Cousin    Diabetes Mother    Heart murmur Mother    Diabetes Father    Hypertension Father    Asthma Daughter    Anemia Maternal Aunt    Stroke Maternal Aunt    Kidney disease Maternal Grandmother    Hypertension Maternal Grandmother    Heart failure Maternal Grandmother    Heart attack Maternal Grandfather     Past Surgical History:  Procedure Laterality Date   COLONOSCOPY WITH PROPOFOL N/A 02/15/2018   Procedure: COLONOSCOPY WITH PROPOFOL;  Surgeon: Wyline Mood, MD;  Location: [facility] ENDOSCOPY;  Service: Gastroenterology;  Laterality: N/A;     Social History   Socioeconomic History   Marital status: Divorced    Spouse name: Not on file   Number of children: 2   Years of education: Not on file   Highest education level: Bachelor's degree (e.g., BA, AB, BS)  Occupational History   Not on file  Tobacco Use   Smoking status: Never   Smokeless tobacco: Never  Vaping Use   Vaping Use: Never used  Substance and Sexual Activity   Alcohol use: Yes    Alcohol/week: 1.0 standard drink of alcohol    Types: 1 Glasses of wine per week   Drug use: No   Sexual activity: Not Currently    Birth control/protection: Post-menopausal  Other Topics Concern   Not on file  Social History Narrative   Lives in [place], grown daughters and grandson are at home   Social Determinants of Health   Financial Resource Strain: Medium Risk (08/11/2022)   Overall Financial Resource Strain (CARDIA)    Difficulty of Paying Living Expenses: Somewhat hard  Food Insecurity: Food Insecurity Present (08/11/2022)   Hunger Vital Sign    Worried About Running Out of Food in the Last Year: Sometimes true    Ran Out of Food in the Last Year: Sometimes true  Transportation Needs: No Transportation Needs (08/11/2022)   PRAPARE - Administrator, Civil Service (Medical): No  Lack of Transportation (Non-Medical): No  Physical Activity: Insufficiently Active (08/11/2022)   Exercise Vital Sign    Days of Exercise per Week: 3 days    Minutes of Exercise per Session: 30 min  Stress: No Stress Concern Present (08/11/2022)   [employer] of Occupational Health - Occupational Stress Questionnaire    Feeling of Stress : Only a little  Social Connections: Moderately Integrated (08/11/2022)   Social Connection and Isolation Panel [NHANES]    Frequency of Communication with Friends and Family: More than three times a week    Frequency of Social Gatherings with Friends and Family: More than three times a week    Attends Religious Services: More than  4 times per year    Active Member of [employer] or Organizations: Yes    Attends Banker Meetings: 1 to 4 times per year    Marital Status: Divorced  Catering manager Violence: Not At Risk (08/11/2022)   Humiliation, Afraid, Rape, and Kick questionnaire    Fear of Current or Ex-Partner: No    Emotionally Abused: No    Physically Abused: No    Sexually Abused: No    Current Outpatient Medications on File Prior to Visit  Medication Sig Dispense Refill   azelastine (ASTELIN) 0.1 % nasal spray Place 2 sprays into both nostrils 2 (two) times daily. Use in each nostril as directed 30 mL 2   EPINEPHrine (EPIPEN 2-PAK) 0.3 mg/0.3 mL IJ SOAJ injection USE AS DIRECTED 2 each 0   EPINEPHrine (EPIPEN 2-PAK) 0.3 mg/0.3 mL IJ SOAJ injection Inject 0.3 mg into the muscle as directed. 2 each 0   estradiol-norethindrone (COMBIPATCH) 0.05-0.14 MG/DAY Place 1 patch onto the skin twice a week 8 patch 11   estrogen, conjugated,-medroxyprogesterone (PREMPRO) 0.625-5 MG tablet Take 1 tablet by mouth once daily 28 tablet 11   levocetirizine (XYZAL) 5 MG tablet Take 1 tablet (5 mg total) by mouth every evening. 90 tablet 1   montelukast (SINGULAIR) 10 MG tablet TAKE 1 TABLET BY MOUTH AT BEDTIME. 90 tablet 3   omeprazole (PRILOSEC) 40 MG capsule TAKE 1 CAPSULE BY MOUTH DAILY. 90 capsule 1   triamcinolone (NASACORT) 55 MCG/ACT AERO nasal inhaler 2 spray in each nostril daily 16.9 mL 11   venlafaxine XR (EFFEXOR XR) 37.5 MG 24 hr capsule Take 1 capsule (37.5 mg total) by mouth daily with breakfast. 30 capsule 0   venlafaxine XR (EFFEXOR XR) 75 MG 24 hr capsule Take 1 capsule (75 mg total) by mouth daily with breakfast. 90 capsule 1   No current facility-administered medications on file prior to visit.    Allergies  Allergen Reactions   Codeine Nausea And Vomiting      Review of Systems ROS Review of Systems - General ROS: negative for - chills, fatigue, fever, hot flashes, night sweats, weight gain  or weight loss Psychological ROS: negative for - anxiety, decreased libido, depression, mood swings, physical abuse or sexual abuse Ophthalmic ROS: negative for - blurry vision, eye pain or loss of vision ENT ROS: negative for - headaches, hearing change, visual changes or vocal changes Allergy and Immunology ROS: negative for - hives, itchy/watery eyes or seasonal allergies Hematological and Lymphatic ROS: negative for - bleeding problems, bruising, swollen lymph nodes or weight loss Endocrine ROS: negative for - galactorrhea, hair pattern changes, hot flashes, malaise/lethargy, mood swings, palpitations, polydipsia/polyuria, skin changes, temperature intolerance or unexpected weight changes Breast ROS: negative for - new or changing breast lumps or nipple discharge Respiratory  ROS: negative for - cough or shortness of breath Cardiovascular ROS: negative for - chest pain, irregular heartbeat, palpitations or shortness of breath Gastrointestinal ROS: no abdominal pain, change in bowel habits, or black or bloody stools Genito-Urinary ROS: no dysuria, trouble voiding, or hematuria Musculoskeletal ROS: negative for - joint pain or joint stiffness Neurological ROS: negative for - bowel and bladder control changes Dermatological ROS: negative for rash and skin lesion changes   Objective:   There were no vitals taken for this visit. CONSTITUTIONAL: Well-developed, well-nourished female in no acute distress.  PSYCHIATRIC: Normal mood and affect. Normal behavior. Normal judgment and thought content. NEUROLGIC: Alert and oriented to person, place, and time. Normal muscle tone coordination. No cranial nerve deficit noted. HENT:  Normocephalic, atraumatic, External right and left ear normal. Oropharynx is clear and moist EYES: Conjunctivae and EOM are normal. Pupils are equal, round, and reactive to light. No scleral icterus.  NECK: Normal range of motion, supple, no masses.  Normal thyroid.  SKIN:  Skin is warm and dry. No rash noted. Not diaphoretic. No erythema. No pallor. CARDIOVASCULAR: Normal heart rate noted, regular rhythm, no murmur. RESPIRATORY: Clear to auscultation bilaterally. Effort and breath sounds normal, no problems with respiration noted. BREASTS: Symmetric in size. No masses, skin changes, nipple drainage, or lymphadenopathy. ABDOMEN: Soft, normal bowel sounds, no distention noted.  No tenderness, rebound or guarding.  BLADDER: Normal PELVIC:  Bladder {:311640}  Urethra: {:311719}  Vulva: {:311722}  Vagina: {:311643}  Cervix: {:311644}  Uterus: {:311718}  Adnexa: {:311645}  RV: {Blank multiple:19196::"External Exam NormaI","No Rectal Masses","Normal Sphincter tone"}  MUSCULOSKELETAL: Normal range of motion. No tenderness.  No cyanosis, clubbing, or edema.  2+ distal pulses. LYMPHATIC: No Axillary, Supraclavicular, or Inguinal Adenopathy.   Labs: Lab Results  Component Value Date   WBC 9.4 03/04/2023   HGB 13.4 03/04/2023   HCT 42.2 03/04/2023   MCV 89.4 03/04/2023   PLT 342 03/04/2023    Lab Results  Component Value Date   CREATININE 0.63 03/04/2023   BUN 14 03/04/2023   NA 140 03/04/2023   K 3.8 03/04/2023   CL 106 03/04/2023   CO2 27 03/04/2023    Lab Results  Component Value Date   ALT 16 08/11/2022   AST 15 08/11/2022   ALKPHOS 88 02/01/2017   BILITOT 0.3 08/11/2022    Lab Results  Component Value Date   CHOL 186 08/11/2022   HDL 53 08/11/2022   LDLCALC 112 (H) 08/11/2022   TRIG 106 08/11/2022   CHOLHDL 3.5 08/11/2022    Lab Results  Component Value Date   TSH 3.700 06/08/2020    Lab Results  Component Value Date   HGBA1C 5.8 (H) 08/11/2022     Assessment:   1. Encounter for well woman exam with routine gynecological exam      Plan:  Pap:  UTD Mammogram: Ordered Colon Screening:   UTD Labs:  UTD Routine preventative health maintenance measures emphasized:  Self Breast Exams and Exercise/Diet/Weight  control COVID Vaccination status: Return to Clinic - 1 Year    Hildred Laser, MD [facility]

## 2023-04-23 DIAGNOSIS — J301 Allergic rhinitis due to pollen: Secondary | ICD-10-CM | POA: Diagnosis not present

## 2023-04-24 ENCOUNTER — Encounter: Payer: Self-pay | Admitting: Obstetrics and Gynecology

## 2023-04-24 ENCOUNTER — Ambulatory Visit (INDEPENDENT_AMBULATORY_CARE_PROVIDER_SITE_OTHER): Payer: BC Managed Care – PPO | Admitting: Obstetrics and Gynecology

## 2023-04-24 VITALS — BP 115/77 | HR 86 | Resp 16 | Ht 62.0 in | Wt 154.1 lb

## 2023-04-24 DIAGNOSIS — Z01419 Encounter for gynecological examination (general) (routine) without abnormal findings: Secondary | ICD-10-CM | POA: Diagnosis not present

## 2023-04-24 DIAGNOSIS — N952 Postmenopausal atrophic vaginitis: Secondary | ICD-10-CM

## 2023-04-24 DIAGNOSIS — R7303 Prediabetes: Secondary | ICD-10-CM

## 2023-04-24 DIAGNOSIS — N951 Menopausal and female climacteric states: Secondary | ICD-10-CM

## 2023-04-25 ENCOUNTER — Encounter: Payer: Self-pay | Admitting: Family Medicine

## 2023-04-25 ENCOUNTER — Ambulatory Visit: Payer: BC Managed Care – PPO | Admitting: Family Medicine

## 2023-04-25 ENCOUNTER — Other Ambulatory Visit: Payer: Self-pay

## 2023-04-25 VITALS — BP 122/84 | HR 90 | Temp 97.7°F | Resp 16 | Ht 62.0 in | Wt 150.4 lb

## 2023-04-25 DIAGNOSIS — M79644 Pain in right finger(s): Secondary | ICD-10-CM | POA: Diagnosis not present

## 2023-04-25 MED ORDER — CHLORHEXIDINE GLUCONATE 4 % EX SOLN
Freq: Two times a day (BID) | CUTANEOUS | 0 refills | Status: AC | PRN
  Filled 2023-04-25: qty 237, 30d supply, fill #0

## 2023-04-25 MED ORDER — MUPIROCIN 2 % EX OINT
1.0000 | TOPICAL_OINTMENT | Freq: Two times a day (BID) | CUTANEOUS | 0 refills | Status: AC
  Filled 2023-04-25: qty 22, 30d supply, fill #0

## 2023-04-25 NOTE — Patient Instructions (Addendum)
Recommend soaks in warm clean soapy water with hibiclens and applying antibiotic ointment to the affected area and please let us know if it does not improve.  Also recommend avoiding biting nails/cuticles and day to day with dry skin or cuticles vaseline can help moisturize and prevent the hang nails and help you maintain cuticles - yours on the middle finger is a little long and dry and would do better if gently pushed back to the base of the nail.  It can turn into an abscess in the crease of skin there between the nail and finger and both of those things may need a procedure to improve.  Hopefully this plan above will help prevent a spreading infection or pocket of infection/abscess.  Follow up here in clinic or at urgent care if any worsening  Paronychia Paronychia is an infection of the skin that surrounds a nail. It usually affects the skin around a fingernail, but it may also occur near a toenail. It often causes pain and swelling around the nail. In some cases, a collection of pus (abscess) can form near or under the nail.  This condition may develop suddenly, or it may develop gradually over a longer period. In most cases, paronychia is not serious, and it will clear up with treatment. What are the causes? This condition may be caused by bacteria or a fungus, such as yeast. The bacteria or fungus can enter the body through an opening in the skin, such as a cut or a hangnail, and cause an infection in your fingernail or toenail. Other causes may include: Recurrent injury to the fingernail or toenail area. Irritation of the base and sides of the nail (cuticle). Injury and irritation can result in inflammation, swelling, and thickened skin around the nail. What increases the risk? This condition is more likely to develop in people who: Get their hands wet often, such as those who work as Fish farm manager, bartenders, or housekeepers. Bite their fingernails or cuticles. Have underlying skin  conditions. Have hangnails or injured fingertips. Are exposed to irritants like detergents and other chemicals. Have diabetes. What are the signs or symptoms? Symptoms of this condition include: Redness and swelling of the skin near the nail. Tenderness around the nail when you touch the area. Pus-filled bumps under the cuticle. Fluid or pus under the nail. Throbbing pain in the area. How is this diagnosed? This condition is diagnosed with a physical exam. In some cases, a sample of pus may be tested to determine what type of bacteria or fungus is causing the condition. How is this treated? Treatment depends on the cause and severity of your condition. If your condition is mild, it may clear up on its own in a few days or after soaking in warm water. If needed, treatment may include: Antibiotic medicine, if your infection is caused by bacteria. Antifungal medicine, if your infection is caused by a fungus. A procedure to drain pus from an abscess. Anti-inflammatory medicine (corticosteroids). Removal of part of an ingrown toenail. A bandage (dressing) may be placed over the affected area if an abscess or part of a nail has been removed. Follow these instructions at home: Wound care Keep the affected area clean. Soak the affected area in warm water if told to do so by your health care provider. You may be told to do this for 20 minutes, 2-3 times a day. Keep the area dry when you are not soaking it. Do not try to drain an abscess yourself. Follow instructions  from your health care provider about how to take care of the affected area. Make sure you: Wash your hands with soap and water for at least 20 seconds before and after you change your dressing. If soap and water are not available, use hand sanitizer. Change your dressing as told by your health care provider. If you had an abscess drained, check the area every day for signs of infection. Check for: Redness, swelling, or pain. Fluid  or blood. Warmth. Pus or a bad smell. Medicines  Take over-the-counter and prescription medicines only as told by your health care provider. If you were prescribed an antibiotic medicine, take it as told by your health care provider. Do not stop taking the antibiotic even if you start to feel better. General instructions Avoid contact with any skin irritants or allergens. Do not pick at the affected area. Keep all follow-up visits as told. This is important. Prevention To prevent this condition from happening again: Wear rubber gloves when washing dishes or doing other tasks that require your hands to get wet. Wear gloves if your hands might come in contact with cleaners or other chemicals. Avoid injuring your nails or fingertips. Do not bite your nails or tear hangnails. Do not cut your nails very short. Do not cut your cuticles. Use clean nail clippers or scissors when trimming nails. Contact a health care provider if: Your symptoms get worse or do not improve with treatment. You have continued or increased fluid, blood, or pus coming from the affected area. Your affected finger, toe, or joint becomes swollen or difficult to move. You have a fever or chills. There is redness spreading away from the affected area. Summary Paronychia is an infection of the skin that surrounds a nail. It often causes pain and swelling around the nail. In some cases, a collection of pus (abscess) can form near or under the nail. This condition may be caused by bacteria or a fungus. These germs can enter the body through an opening in the skin, such as a cut or a hangnail. If your condition is mild, it may clear up on its own in a few days. If needed, treatment may include medicine or a procedure to drain pus from an abscess. To prevent this condition from happening again, wear gloves if doing tasks that require your hands to get wet or to come in contact with chemicals. Also avoid injuring your nails or  fingertips. This information is not intended to replace advice given to you by your health care provider. Make sure you discuss any questions you have with your health care provider. Document Revised: 01/24/2021 Document Reviewed: 01/24/2021 Elsevier Patient Education  2024 [employer].

## 2023-04-25 NOTE — Progress Notes (Signed)
Patient ID: Makayla Mason, female    DOB: 1965/12/06, 57 y.o.   MRN: [id number]  PCP: Alba Cory, MD  Chief Complaint  Patient presents with   Nail Problem    Middle finger nail on right hand hurting her    Subjective:   Makayla Mason is a 57 y.o. female, presents to clinic with CC of the following:  HPI  Pt presents with right middle finger "hangnail" and finger swollen and tender Onset the last few days She notes biting nails and she has some dry nails/cuticles Prior hx of infection/paronychia that had to be I&D, it is not as severe as that was No redness or drainage, just sore/tender  Patient Active Problem List   Diagnosis Date Noted   Vasomotor symptoms due to menopause 08/11/2022   Sprain of ulnar collateral ligament of metacarpophalangeal (MCP) joint of right thumb 06/08/2020   Cervical radicular pain 07/15/2018   Overweight (BMI 25.0-29.9) 01/29/2018   Right hip pain 01/29/2018   Insulin resistance 02/28/2017   Low grade squamous intraepith lesion on cytologic smear cervix (lgsil) 02/28/2017   GERD without esophagitis 03/15/2016   Left ankle pain 03/15/2016   Allergic rhinitis, seasonal 05/21/2015   Scoliosis 05/21/2015   Arthralgia of temporomandibular joint 05/21/2015      Current Outpatient Medications:    azelastine (ASTELIN) 0.1 % nasal spray, Place 2 sprays into both nostrils 2 (two) times daily. Use in each nostril as directed, Disp: 30 mL, Rfl: 2   EPINEPHrine (EPIPEN 2-PAK) 0.3 mg/0.3 mL IJ SOAJ injection, USE AS DIRECTED, Disp: 2 each, Rfl: 0   estrogen, conjugated,-medroxyprogesterone (PREMPRO) 0.625-5 MG tablet, Take 1 tablet by mouth once daily, Disp: 28 tablet, Rfl: 11   levocetirizine (XYZAL) 5 MG tablet, Take 1 tablet (5 mg total) by mouth every evening., Disp: 90 tablet, Rfl: 1   montelukast (SINGULAIR) 10 MG tablet, TAKE 1 TABLET BY MOUTH AT BEDTIME., Disp: 90 tablet, Rfl: 3   mupirocin ointment (BACTROBAN) 2 %, Apply 1 Application  topically 2 (two) times daily., Disp: 22 g, Rfl: 0   omeprazole (PRILOSEC) 40 MG capsule, TAKE 1 CAPSULE BY MOUTH DAILY., Disp: 90 capsule, Rfl: 1   triamcinolone (NASACORT) 55 MCG/ACT AERO nasal inhaler, 2 spray in each nostril daily, Disp: 16.9 mL, Rfl: 11   venlafaxine XR (EFFEXOR XR) 37.5 MG 24 hr capsule, Take 1 capsule (37.5 mg total) by mouth daily with breakfast., Disp: 30 capsule, Rfl: 0   venlafaxine XR (EFFEXOR XR) 75 MG 24 hr capsule, Take 1 capsule (75 mg total) by mouth daily with breakfast., Disp: 90 capsule, Rfl: 1   Allergies  Allergen Reactions   Codeine Nausea And Vomiting     Social History   Tobacco Use   Smoking status: Never   Smokeless tobacco: Never  Vaping Use   Vaping Use: Never used  Substance Use Topics   Alcohol use: Yes    Alcohol/week: 1.0 standard drink of alcohol    Types: 1 Glasses of wine per week   Drug use: No      Chart Review Today: I personally reviewed active problem list, medication list, allergies, family history, social history, health maintenance, notes from last encounter, lab results, imaging with the patient/caregiver today.   Review of Systems  Constitutional: Negative.   HENT: Negative.    Eyes: Negative.   Respiratory: Negative.    Cardiovascular: Negative.   Gastrointestinal: Negative.   Endocrine: Negative.   Genitourinary: Negative.   Musculoskeletal: Negative.  Skin: Negative.   Allergic/Immunologic: Negative.   Neurological: Negative.   Hematological: Negative.   Psychiatric/Behavioral: Negative.    All other systems reviewed and are negative.      Objective:   Vitals:   04/25/23 1148  BP: 122/84  Pulse: 90  Resp: 16  Temp: 97.7 F (36.5 C)  TempSrc: Oral  SpO2: 97%  Weight: 150 lb 6.4 oz (68.2 kg)  Height: 5\' 2"  (1.575 m)    Body mass index is 27.51 kg/m.  Physical Exam Vitals and nursing note reviewed.  Constitutional:      Appearance: She is well-developed.  HENT:     Head:  Normocephalic and atraumatic.     Nose: Nose normal.  Eyes:     General:        Right eye: No discharge.        Left eye: No discharge.     Conjunctiva/sclera: Conjunctivae normal.  Neck:     Trachea: No tracheal deviation.  Cardiovascular:     Rate and Rhythm: Normal rate and regular rhythm.  Pulmonary:     Effort: Pulmonary effort is normal. No respiratory distress.     Breath sounds: No stridor.  Musculoskeletal:        General: Normal range of motion.     Right hand: Tenderness present. No swelling, lacerations or bony tenderness. Normal range of motion. Normal strength. Normal sensation. Normal capillary refill.     Comments: Right middle finger distal aspect lateral nail with dry peeling to lateral nail margin, grown out cuticle at base of nail - much longer than other nails, ttp to the area, no swelling or fluctuance Normal ROM of finger  Skin:    General: Skin is warm and dry.     Findings: No rash.  Neurological:     Mental Status: She is alert.     Motor: No abnormal muscle tone.     Coordination: Coordination normal.  Psychiatric:        Behavior: Behavior normal.      Results for orders placed or performed during the hospital encounter of 03/04/23  Basic metabolic panel  Result Value Ref Range   Sodium 140 135 - 145 mmol/L   Potassium 3.8 3.5 - 5.1 mmol/L   Chloride 106 98 - 111 mmol/L   CO2 27 22 - 32 mmol/L   Glucose, Bld 88 70 - 99 mg/dL   BUN 14 6 - 20 mg/dL   Creatinine, Ser 4.33 0.44 - 1.00 mg/dL   Calcium 9.3 8.9 - 29.5 mg/dL   GFR, Estimated >18 >84 mL/min   Anion gap 7 5 - 15  CBC  Result Value Ref Range   WBC 9.4 4.0 - 10.5 K/uL   RBC 4.72 3.87 - 5.11 MIL/uL   Hemoglobin 13.4 12.0 - 15.0 g/dL   HCT 16.6 06.3 - 01.6 %   MCV 89.4 80.0 - 100.0 fL   MCH 28.4 26.0 - 34.0 pg   MCHC 31.8 30.0 - 36.0 g/dL   RDW 01.0 93.2 - 35.5 %   Platelets 342 150 - 400 K/uL   nRBC 0.0 0.0 - 0.2 %  Troponin I (High Sensitivity)  Result Value Ref Range    Troponin I (High Sensitivity) <2 <18 ng/L       Assessment & Plan:     ICD-10-CM   1. Finger pain, right  M79.644 mupirocin ointment (BACTROBAN) 2 %    Some focal tenderness on side of right middle finger with hangnail and  long dry cuticles, does not appear to have paronychia Pt admits to biting nails/skin often and hx of paronychia to his same finger Encouraged her to do warm soaks with hibiclens or dial soap, apply mupirocin or OTC antibiotic ointment to the affected area for a few days, after inflammation improves she may want to gently push her overgrown cuticle back some to avoid further peeling/irritation, and I have recommended tx dryness/cuticles with vaseline thereafter.  Also encouraged her to avoid biting to avoid bacteria from mouth If worsening she likely will need recheck because paronychias would need to be drained She verbalized understanding of plan and f/up These instructions were printed out for her and reviewed as well. Currently no indication for abx      Makayla Berry, PA-C 04/25/23 12:06 PM

## 2023-04-30 DIAGNOSIS — J301 Allergic rhinitis due to pollen: Secondary | ICD-10-CM | POA: Diagnosis not present

## 2023-05-01 DIAGNOSIS — J301 Allergic rhinitis due to pollen: Secondary | ICD-10-CM | POA: Diagnosis not present

## 2023-05-07 DIAGNOSIS — J301 Allergic rhinitis due to pollen: Secondary | ICD-10-CM | POA: Diagnosis not present

## 2023-05-14 DIAGNOSIS — J301 Allergic rhinitis due to pollen: Secondary | ICD-10-CM | POA: Diagnosis not present

## 2023-05-15 ENCOUNTER — Ambulatory Visit
Admission: RE | Admit: 2023-05-15 | Discharge: 2023-05-15 | Disposition: A | Discharge status: Home / Self Care | Payer: BC Managed Care – PPO | Source: Ambulatory Visit | Attending: Family Medicine | Admitting: Family Medicine

## 2023-05-15 DIAGNOSIS — Z1231 Encounter for screening mammogram for malignant neoplasm of breast: Secondary | ICD-10-CM | POA: Diagnosis not present

## 2023-05-16 DIAGNOSIS — R011 Cardiac murmur, unspecified: Secondary | ICD-10-CM | POA: Diagnosis not present

## 2023-05-16 DIAGNOSIS — K219 Gastro-esophageal reflux disease without esophagitis: Secondary | ICD-10-CM | POA: Diagnosis not present

## 2023-05-16 DIAGNOSIS — R0602 Shortness of breath: Secondary | ICD-10-CM | POA: Diagnosis not present

## 2023-05-16 DIAGNOSIS — R079 Chest pain, unspecified: Secondary | ICD-10-CM | POA: Diagnosis not present

## 2023-05-21 DIAGNOSIS — J301 Allergic rhinitis due to pollen: Secondary | ICD-10-CM | POA: Diagnosis not present

## 2023-05-28 DIAGNOSIS — J301 Allergic rhinitis due to pollen: Secondary | ICD-10-CM | POA: Diagnosis not present

## 2023-05-30 ENCOUNTER — Other Ambulatory Visit: Payer: Self-pay

## 2023-05-30 ENCOUNTER — Other Ambulatory Visit: Payer: Self-pay | Admitting: Family Medicine

## 2023-05-30 DIAGNOSIS — J302 Other seasonal allergic rhinitis: Secondary | ICD-10-CM

## 2023-05-30 MED ORDER — LEVOCETIRIZINE DIHYDROCHLORIDE 5 MG PO TABS
5.0000 mg | ORAL_TABLET | Freq: Every evening | ORAL | 0 refills | Status: AC
  Filled 2023-05-30 – 2023-06-22 (×2): qty 90, 90d supply, fill #0

## 2023-05-30 MED FILL — Montelukast Sodium Tab 10 MG (Base Equiv): ORAL | 90 days supply | Qty: 90 | Fill #3 | Status: CN

## 2023-06-04 DIAGNOSIS — J301 Allergic rhinitis due to pollen: Secondary | ICD-10-CM | POA: Diagnosis not present

## 2023-06-11 DIAGNOSIS — J301 Allergic rhinitis due to pollen: Secondary | ICD-10-CM | POA: Diagnosis not present

## 2023-06-14 ENCOUNTER — Other Ambulatory Visit: Payer: Self-pay

## 2023-06-18 DIAGNOSIS — J301 Allergic rhinitis due to pollen: Secondary | ICD-10-CM | POA: Diagnosis not present

## 2023-06-20 NOTE — Progress Notes (Deleted)
Name: Makayla Mason   MRN: [id number]    DOB: 09-Jun-1966   Date:06/20/2023       Progress Note  Subjective  Chief Complaint  Headaches  HPI  *** Patient Active Problem List   Diagnosis Date Noted   Vasomotor symptoms due to menopause 08/11/2022   Sprain of ulnar collateral ligament of metacarpophalangeal (MCP) joint of right thumb 06/08/2020   Cervical radicular pain 07/15/2018   Overweight (BMI 25.0-29.9) 01/29/2018   Right hip pain 01/29/2018   Insulin resistance 02/28/2017   Low grade squamous intraepith lesion on cytologic smear cervix (lgsil) 02/28/2017   GERD without esophagitis 03/15/2016   Left ankle pain 03/15/2016   Allergic rhinitis, seasonal 05/21/2015   Scoliosis 05/21/2015   Arthralgia of temporomandibular joint 05/21/2015    Past Surgical History:  Procedure Laterality Date   COLONOSCOPY WITH PROPOFOL N/A 02/15/2018   Procedure: COLONOSCOPY WITH PROPOFOL;  Surgeon: Wyline Mood, MD;  Location: [facility] ENDOSCOPY;  Service: Gastroenterology;  Laterality: N/A;    Family History  Problem Relation Age of Onset   Breast cancer Cousin    Diabetes Mother    Heart murmur Mother    Diabetes Father    Hypertension Father    Asthma Daughter    Anemia Maternal Aunt    Stroke Maternal Aunt    Kidney disease Maternal Grandmother    Hypertension Maternal Grandmother    Heart failure Maternal Grandmother    Heart attack Maternal Grandfather     Social History   Tobacco Use   Smoking status: Never   Smokeless tobacco: Never  Substance Use Topics   Alcohol use: Yes    Alcohol/week: 1.0 standard drink of alcohol    Types: 1 Glasses of wine per week     Current Outpatient Medications:    azelastine (ASTELIN) 0.1 % nasal spray, Place 2 sprays into both nostrils 2 (two) times daily. Use in each nostril as directed, Disp: 30 mL, Rfl: 2   chlorhexidine (HIBICLENS) 4 % external liquid, Apply topically 2 (two) times daily as needed (soaks to finger for inflammation  infection for 3-5 days)., Disp: 237 mL, Rfl: 0   EPINEPHrine (EPIPEN 2-PAK) 0.3 mg/0.3 mL IJ SOAJ injection, USE AS DIRECTED, Disp: 2 each, Rfl: 0   estrogen, conjugated,-medroxyprogesterone (PREMPRO) 0.625-5 MG tablet, Take 1 tablet by mouth once daily, Disp: 28 tablet, Rfl: 11   levocetirizine (XYZAL) 5 MG tablet, Take 1 tablet (5 mg total) by mouth every evening., Disp: 90 tablet, Rfl: 0   montelukast (SINGULAIR) 10 MG tablet, TAKE 1 TABLET BY MOUTH AT BEDTIME., Disp: 90 tablet, Rfl: 3   mupirocin ointment (BACTROBAN) 2 %, Apply 1 Application topically 2 (two) times daily., Disp: 22 g, Rfl: 0   omeprazole (PRILOSEC) 40 MG capsule, TAKE 1 CAPSULE BY MOUTH DAILY., Disp: 90 capsule, Rfl: 1   triamcinolone (NASACORT) 55 MCG/ACT AERO nasal inhaler, 2 spray in each nostril daily, Disp: 16.9 mL, Rfl: 11   venlafaxine XR (EFFEXOR XR) 37.5 MG 24 hr capsule, Take 1 capsule (37.5 mg total) by mouth daily with breakfast., Disp: 30 capsule, Rfl: 0   venlafaxine XR (EFFEXOR XR) 75 MG 24 hr capsule, Take 1 capsule (75 mg total) by mouth daily with breakfast., Disp: 90 capsule, Rfl: 1  Allergies  Allergen Reactions   Codeine Nausea And Vomiting    I personally reviewed active problem list, medication list, allergies, family history, social history, health maintenance with the patient/caregiver today.   ROS  ***  Objective  There were no vitals filed for this visit.  There is no height or weight on file to calculate BMI.  Physical Exam ***  No results found for this or any previous visit (from the past 2160 hour(s)).   PHQ2/9:    04/25/2023   11:48 AM 04/24/2023    9:03 AM 11/13/2022   10:57 AM 11/10/2022    8:14 AM 11/01/2022   10:31 AM  Depression screen PHQ 2/9  Decreased Interest 0 0 0 0 0  Down, Depressed, Hopeless 0 0 0 0 0  PHQ - 2 Score 0 0 0 0 0  Altered sleeping 0  0 0 0  Tired, decreased energy 0  0 0 0  Change in appetite 0  0 0 0  Feeling bad or failure about yourself  0  0  0 0  Trouble concentrating 0  0 0 0  Moving slowly or fidgety/restless 0  0 0 0  Suicidal thoughts 0  0 0 0  PHQ-9 Score 0  0 0 0  Difficult doing work/chores Not difficult at all  Not difficult at all Not difficult at all Not difficult at all    phq 9 is {gen pos [id number]}   Fall Risk:    04/25/2023   11:48 AM 04/24/2023    9:03 AM 11/13/2022   10:57 AM 11/10/2022    8:14 AM 11/01/2022   10:31 AM  Fall Risk   Falls in the past year? 0 0 0 0 0  Number falls in past yr: 0 0 0 0 0  Injury with Fall? 0 0 0 0 0  Risk for fall due to : No Fall Risks No Fall Risks No Fall Risks No Fall Risks   Follow up Falls prevention discussed;Education provided;Falls evaluation completed Falls evaluation completed Falls prevention discussed;Education provided;Falls evaluation completed Falls prevention discussed;Education provided;Falls evaluation completed       Functional Status Survey:      Assessment & Plan  *** There are no diagnoses linked to this encounter.

## 2023-06-21 ENCOUNTER — Ambulatory Visit: Payer: BC Managed Care – PPO | Admitting: Family Medicine

## 2023-06-21 NOTE — Progress Notes (Signed)
BP 120/82   Pulse 98   Temp 98 F (36.7 C) (Oral)   Resp 16   Ht 5\' 2"  (1.575 m)   Wt 150 lb 4.8 oz (68.2 kg)   SpO2 100%   BMI 27.49 kg/m    Subjective:    Patient ID: Makayla Mason, female    DOB: 1965/12/20, 57 y.o.   MRN: [id number]  HPI: Makayla Mason is a 57 y.o. female  Chief Complaint  Patient presents with   Allergic Rhinitis    Allergies/headache: patient reports she has had a mild headache off and on for the last two weeks.she says that she talked to her allergist about it and he said it was her allergies but if she wanted a covid test she should come to her pcp.  She denies any changes in vision, numbness, changes in speech or mentation. She denies any fever or trauma.  She denies any cough or congestion.  She reports that she takes her nasacort, singulair and xyzal. She reports that she takes ibuprofen every once in awhile for the headache and that helps.  Will get covid test as requested.  She reports it is not abnormal for her to have a headache with her allergies.  Neuro exam normal.  Will get covid test. Continue to take allergy medication, ibuprofen, increase water intake. If no improvement please follow back up.   Relevant past medical, surgical, family and social history reviewed and updated as indicated. Interim medical history since our last visit reviewed. Allergies and medications reviewed and updated.  Review of Systems  Constitutional: Negative for fever or weight change.  Respiratory: Negative for cough and shortness of breath.   Cardiovascular: Negative for chest pain or palpitations.  Gastrointestinal: Negative for abdominal pain, no bowel changes.  Musculoskeletal: Negative for gait problem or joint swelling.  Skin: Negative for rash.  Neurological: Negative for dizziness, positive for headache.  No other specific complaints in a complete review of systems (except as listed in HPI above).      Objective:    BP 120/82   Pulse 98   Temp 98  F (36.7 C) (Oral)   Resp 16   Ht 5\' 2"  (1.575 m)   Wt 150 lb 4.8 oz (68.2 kg)   SpO2 100%   BMI 27.49 kg/m   Wt Readings from Last 3 Encounters:  06/22/23 150 lb 4.8 oz (68.2 kg)  04/25/23 150 lb 6.4 oz (68.2 kg)  04/24/23 154 lb 1.6 oz (69.9 kg)    Physical Exam  Constitutional: Patient appears well-developed and well-nourished.  No distress.  HEENT: head atraumatic, normocephalic, pupils equal and reactive to light, neck supple, throat within normal limits Cardiovascular: Normal rate, regular rhythm and normal heart sounds.  No murmur heard. No BLE edema. Pulmonary/Chest: Effort normal and breath sounds normal. No respiratory distress. Abdominal: Soft.  There is no tenderness. Neuro:  cranial nerves intact,  stable gait, equal grip, no arm drift Psychiatric: Patient has a normal mood and affect. behavior is normal. Judgment and thought content normal.  Results for orders placed or performed during the hospital encounter of 03/04/23  Basic metabolic panel  Result Value Ref Range   Sodium 140 135 - 145 mmol/L   Potassium 3.8 3.5 - 5.1 mmol/L   Chloride 106 98 - 111 mmol/L   CO2 27 22 - 32 mmol/L   Glucose, Bld 88 70 - 99 mg/dL   BUN 14 6 - 20 mg/dL   Creatinine, Ser  0.63 0.44 - 1.00 mg/dL   Calcium 9.3 8.9 - 29.5 mg/dL   GFR, Estimated >28 >41 mL/min   Anion gap 7 5 - 15  CBC  Result Value Ref Range   WBC 9.4 4.0 - 10.5 K/uL   RBC 4.72 3.87 - 5.11 MIL/uL   Hemoglobin 13.4 12.0 - 15.0 g/dL   HCT 32.4 40.1 - 02.7 %   MCV 89.4 80.0 - 100.0 fL   MCH 28.4 26.0 - 34.0 pg   MCHC 31.8 30.0 - 36.0 g/dL   RDW 25.3 66.4 - 40.3 %   Platelets 342 150 - 400 K/uL   nRBC 0.0 0.0 - 0.2 %  Troponin I (High Sensitivity)  Result Value Ref Range   Troponin I (High Sensitivity) <2 <18 ng/L      Assessment & Plan:   Problem List Items Addressed This Visit   None Visit Diagnoses     Acute nonintractable headache, unspecified headache type    -  Primary   obtain covid pcr,  push  fluids, continue allergy medication and ibuprofen, come back for follow up if no improvement.   Relevant Orders   Novel Coronavirus, NAA ([facility])        Follow up plan: Return if symptoms worsen or fail to improve.

## 2023-06-22 ENCOUNTER — Other Ambulatory Visit: Payer: Self-pay

## 2023-06-22 ENCOUNTER — Encounter: Payer: Self-pay | Admitting: Nurse Practitioner

## 2023-06-22 ENCOUNTER — Ambulatory Visit (INDEPENDENT_AMBULATORY_CARE_PROVIDER_SITE_OTHER): Payer: BC Managed Care – PPO | Admitting: Nurse Practitioner

## 2023-06-22 VITALS — BP 120/82 | HR 98 | Temp 98.0°F | Resp 16 | Ht 62.0 in | Wt 150.3 lb

## 2023-06-22 DIAGNOSIS — R519 Headache, unspecified: Secondary | ICD-10-CM

## 2023-06-23 LAB — NOVEL CORONAVIRUS, NAA: SARS-CoV-2, NAA: NOT DETECTED

## 2023-06-25 DIAGNOSIS — J301 Allergic rhinitis due to pollen: Secondary | ICD-10-CM | POA: Diagnosis not present

## 2023-07-02 DIAGNOSIS — J301 Allergic rhinitis due to pollen: Secondary | ICD-10-CM | POA: Diagnosis not present

## 2023-07-04 ENCOUNTER — Other Ambulatory Visit: Payer: Self-pay

## 2023-07-16 DIAGNOSIS — J301 Allergic rhinitis due to pollen: Secondary | ICD-10-CM | POA: Diagnosis not present

## 2023-07-18 DIAGNOSIS — J301 Allergic rhinitis due to pollen: Secondary | ICD-10-CM | POA: Diagnosis not present

## 2023-07-23 DIAGNOSIS — J301 Allergic rhinitis due to pollen: Secondary | ICD-10-CM | POA: Diagnosis not present

## 2023-07-30 DIAGNOSIS — J301 Allergic rhinitis due to pollen: Secondary | ICD-10-CM | POA: Diagnosis not present

## 2023-08-06 DIAGNOSIS — J301 Allergic rhinitis due to pollen: Secondary | ICD-10-CM | POA: Diagnosis not present

## 2023-08-13 DIAGNOSIS — J301 Allergic rhinitis due to pollen: Secondary | ICD-10-CM | POA: Diagnosis not present

## 2023-08-14 NOTE — Progress Notes (Deleted)
Name: Makayla Mason   MRN: [id number]    DOB: 1966-10-23   Date:08/14/2023       Progress Note  Subjective  Chief Complaint  Annual Exam  HPI  Patient presents for annual CPE.  Diet: *** Exercise: ***  Last Eye Exam: *** Last Dental Exam: ***  Flowsheet Row Office Visit from [facility]/22/2023 in [facility]  AUDIT-C Score 0      Depression: Phq 9 is  {Desc; negative/positive:13464}    06/22/2023   11:44 AM 04/25/2023   11:48 AM 04/24/2023    9:03 AM 11/13/2022   10:57 AM 11/10/2022    8:14 AM  Depression screen PHQ 2/9  Decreased Interest 0 0 0 0 0  Down, Depressed, Hopeless 0 0 0 0 0  PHQ - 2 Score 0 0 0 0 0  Altered sleeping  0  0 0  Tired, decreased energy  0  0 0  Change in appetite  0  0 0  Feeling bad or failure about yourself   0  0 0  Trouble concentrating  0  0 0  Moving slowly or fidgety/restless  0  0 0  Suicidal thoughts  0  0 0  PHQ-9 Score  0  0 0  Difficult doing work/chores  Not difficult at all  Not difficult at all Not difficult at all   Hypertension: BP Readings from Last 3 Encounters:  06/22/23 120/82  04/25/23 122/84  04/24/23 115/77   Obesity: Wt Readings from Last 3 Encounters:  06/22/23 150 lb 4.8 oz (68.2 kg)  04/25/23 150 lb 6.4 oz (68.2 kg)  04/24/23 154 lb 1.6 oz (69.9 kg)   BMI Readings from Last 3 Encounters:  06/22/23 27.49 kg/m  04/25/23 27.51 kg/m  04/24/23 28.19 kg/m     Vaccines:   HPV: N/A Tdap: up to date Shingrix: up to date Pneumonia: up to date Flu: due COVID-19: up to date   Hep C Screening: 03/20/16 STD testing and prevention (HIV/chl/gon/syphilis): 02/01/17 Intimate partner violence: negative screen  Sexual History : Menstrual History/LMP/Abnormal Bleeding:  Discussed importance of follow up if any post-menopausal bleeding: yes  Incontinence Symptoms: negative for symptoms   Breast cancer:  - Last Mammogram: 05/15/23 - BRCA gene screening: N/A  Osteoporosis Prevention : Discussed  high calcium and vitamin D supplementation, weight bearing exercises Bone density: 12/13/10   Cervical cancer screening: 09/20/21  Skin cancer: Discussed monitoring for atypical lesions  Colorectal cancer: 02/15/18   Lung cancer:  Low Dose CT Chest recommended if Age 30-80 years, 20 pack-year currently smoking OR have quit w/in 15years. Patient does not qualify for screen   ECG: 03/05/23  Advanced Care Planning: A voluntary discussion about advance care planning including the explanation and discussion of advance directives.  Discussed health care proxy and Living will, and the patient was able to identify a health care proxy as ***.  Patient does not have a living will and power of attorney of health care   Lipids: Lab Results  Component Value Date   CHOL 186 08/11/2022   CHOL 169 08/10/2021   CHOL 187 06/08/2020   Lab Results  Component Value Date   HDL 53 08/11/2022   HDL 56 08/10/2021   HDL 52 06/08/2020   Lab Results  Component Value Date   LDLCALC 112 (H) 08/11/2022   LDLCALC 96 08/10/2021   LDLCALC 114 (H) 06/08/2020   Lab Results  Component Value Date   TRIG 106 08/11/2022  TRIG 77 08/10/2021   TRIG 115 06/08/2020   Lab Results  Component Value Date   CHOLHDL 3.5 08/11/2022   CHOLHDL 3.0 08/10/2021   CHOLHDL 3.6 06/08/2020   No results found for: "LDLDIRECT"  Glucose: Glucose, Bld  Date Value Ref Range Status  03/04/2023 88 70 - 99 mg/dL Final    Comment:    Glucose reference range applies only to samples taken after fasting for at least 8 hours.  08/11/2022 75 65 - 99 mg/dL Final    Comment:    .            Fasting reference interval .   08/10/2021 88 65 - 99 mg/dL Final    Comment:    .            Fasting reference interval .     Patient Active Problem List   Diagnosis Date Noted   Vasomotor symptoms due to menopause 08/11/2022   Sprain of ulnar collateral ligament of metacarpophalangeal (MCP) joint of right thumb 06/08/2020   Cervical  radicular pain 07/15/2018   Overweight (BMI 25.0-29.9) 01/29/2018   Right hip pain 01/29/2018   Insulin resistance 02/28/2017   Low grade squamous intraepith lesion on cytologic smear cervix (lgsil) 02/28/2017   GERD without esophagitis 03/15/2016   Left ankle pain 03/15/2016   Allergic rhinitis, seasonal 05/21/2015   Scoliosis 05/21/2015   Arthralgia of temporomandibular joint 05/21/2015    Past Surgical History:  Procedure Laterality Date   COLONOSCOPY WITH PROPOFOL N/A 02/15/2018   Procedure: COLONOSCOPY WITH PROPOFOL;  Surgeon: Wyline Mood, MD;  Location: [facility] ENDOSCOPY;  Service: Gastroenterology;  Laterality: N/A;    Family History  Problem Relation Age of Onset   Breast cancer Cousin    Diabetes Mother    Heart murmur Mother    Diabetes Father    Hypertension Father    Asthma Daughter    Anemia Maternal Aunt    Stroke Maternal Aunt    Kidney disease Maternal Grandmother    Hypertension Maternal Grandmother    Heart failure Maternal Grandmother    Heart attack Maternal Grandfather     Social History   Socioeconomic History   Marital status: Divorced    Spouse name: Not on file   Number of children: 2   Years of education: Not on file   Highest education level: Bachelor's degree (e.g., BA, AB, BS)  Occupational History   Not on file  Tobacco Use   Smoking status: Never   Smokeless tobacco: Never  Vaping Use   Vaping status: Never Used  Substance and Sexual Activity   Alcohol use: Yes    Alcohol/week: 1.0 standard drink of alcohol    Types: 1 Glasses of wine per week   Drug use: No   Sexual activity: Not Currently    Birth control/protection: Post-menopausal  Other Topics Concern   Not on file  Social History Narrative   Lives in [place], grown daughters and grandson are at home   Social Determinants of Health   Financial Resource Strain: Medium Risk (08/11/2022)   Overall Financial Resource Strain (CARDIA)    Difficulty of Paying Living Expenses:  Somewhat hard  Food Insecurity: Food Insecurity Present (08/11/2022)   Hunger Vital Sign    Worried About Running Out of Food in the Last Year: Sometimes true    Ran Out of Food in the Last Year: Sometimes true  Transportation Needs: No Transportation Needs (08/11/2022)   PRAPARE - Transportation    Lack of  Transportation (Medical): No    Lack of Transportation (Non-Medical): No  Physical Activity: Insufficiently Active (08/11/2022)   Exercise Vital Sign    Days of Exercise per Week: 3 days    Minutes of Exercise per Session: 30 min  Stress: No Stress Concern Present (08/11/2022)   [employer] of Occupational Health - Occupational Stress Questionnaire    Feeling of Stress : Only a little  Social Connections: Moderately Integrated (08/11/2022)   Social Connection and Isolation Panel [NHANES]    Frequency of Communication with Friends and Family: More than three times a week    Frequency of Social Gatherings with Friends and Family: More than three times a week    Attends Religious Services: More than 4 times per year    Active Member of [employer] or Organizations: Yes    Attends Banker Meetings: 1 to 4 times per year    Marital Status: Divorced  Catering manager Violence: Not At Risk (08/11/2022)   Humiliation, Afraid, Rape, and Kick questionnaire    Fear of Current or Ex-Partner: No    Emotionally Abused: No    Physically Abused: No    Sexually Abused: No     Current Outpatient Medications:    chlorhexidine (HIBICLENS) 4 % external liquid, Apply topically 2 (two) times daily as needed (soaks to finger for inflammation infection for 3-5 days)., Disp: 237 mL, Rfl: 0   EPINEPHrine (EPIPEN 2-PAK) 0.3 mg/0.3 mL IJ SOAJ injection, USE AS DIRECTED, Disp: 2 each, Rfl: 0   estrogen, conjugated,-medroxyprogesterone (PREMPRO) 0.625-5 MG tablet, Take 1 tablet by mouth once daily, Disp: 28 tablet, Rfl: 11   levocetirizine (XYZAL) 5 MG tablet, Take 1 tablet (5 mg total) by mouth  every evening., Disp: 90 tablet, Rfl: 0   montelukast (SINGULAIR) 10 MG tablet, TAKE 1 TABLET BY MOUTH AT BEDTIME., Disp: 90 tablet, Rfl: 3   mupirocin ointment (BACTROBAN) 2 %, Apply 1 Application topically 2 (two) times daily., Disp: 22 g, Rfl: 0   omeprazole (PRILOSEC) 40 MG capsule, TAKE 1 CAPSULE BY MOUTH DAILY., Disp: 90 capsule, Rfl: 1   triamcinolone (NASACORT) 55 MCG/ACT AERO nasal inhaler, 2 spray in each nostril daily, Disp: 16.9 mL, Rfl: 11   venlafaxine XR (EFFEXOR XR) 37.5 MG 24 hr capsule, Take 1 capsule (37.5 mg total) by mouth daily with breakfast., Disp: 30 capsule, Rfl: 0   venlafaxine XR (EFFEXOR XR) 75 MG 24 hr capsule, Take 1 capsule (75 mg total) by mouth daily with breakfast., Disp: 90 capsule, Rfl: 1  Allergies  Allergen Reactions   Codeine Nausea And Vomiting     ROS  ***  Objective  There were no vitals filed for this visit.  There is no height or weight on file to calculate BMI.  Physical Exam ***  Recent Results (from the past 2160 hour(s))  Novel Coronavirus, NAA ([facility])     Status: None   Collection Time: 06/22/23 12:00 AM   Specimen: Nasopharyngeal(NP) swabs in vial transport medium   Nasopharynge  Previous  Result Value Ref Range   SARS-CoV-2, NAA Not Detected Not Detected    Comment: This nucleic acid amplification test was developed and its performance characteristics determined by [employer]. Nucleic acid amplification tests include RT-PCR and TMA. This test has not been FDA cleared or approved. This test has been authorized by FDA under an Emergency Use Authorization (EUA). This test is only authorized for the duration of time the declaration that circumstances exist justifying the authorization of the  emergency use of in vitro diagnostic tests for detection of SARS-CoV-2 virus and/or diagnosis of COVID-19 infection under section 564(b)(1) of the Act, 21 U.S.C. 213YQM-5(H) (1), unless the authorization is terminated or  revoked sooner. When diagnostic testing is negative, the possibility of a false negative result should be considered in the context of a patient's recent exposures and the presence of clinical signs and symptoms consistent with COVID-19. An individual without symptoms of COVID-19 and who is not shedding SARS-CoV-2 virus wo uld expect to have a negative (not detected) result in this assay.      Fall Risk:    06/22/2023   11:44 AM 04/25/2023   11:48 AM 04/24/2023    9:03 AM 11/13/2022   10:57 AM 11/10/2022    8:14 AM  Fall Risk   Falls in the past year? 0 0 0 0 0  Number falls in past yr: 0 0 0 0 0  Injury with Fall? 0 0 0 0 0  Risk for fall due to :  No Fall Risks No Fall Risks No Fall Risks No Fall Risks  Follow up  Falls prevention discussed;Education provided;Falls evaluation completed Falls evaluation completed Falls prevention discussed;Education provided;Falls evaluation completed Falls prevention discussed;Education provided;Falls evaluation completed     Functional Status Survey:     Assessment & Plan  1. Well adult exam ***   -USPSTF grade A and B recommendations reviewed with patient; age-appropriate recommendations, preventive care, screening tests, etc discussed and encouraged; healthy living encouraged; see AVS for patient education given to patient -Discussed importance of 150 minutes of physical activity weekly, eat two servings of fish weekly, eat one serving of tree nuts ( cashews, pistachios, pecans, almonds.Marland Kitchen) every other day, eat 6 servings of fruit/vegetables daily and drink plenty of water and avoid sweet beverages.   -Reviewed Health Maintenance: Yes.

## 2023-08-15 ENCOUNTER — Encounter: Payer: BC Managed Care – PPO | Admitting: Family Medicine

## 2023-08-15 DIAGNOSIS — Z0000 Encounter for general adult medical examination without abnormal findings: Secondary | ICD-10-CM

## 2023-08-20 DIAGNOSIS — J301 Allergic rhinitis due to pollen: Secondary | ICD-10-CM | POA: Diagnosis not present

## 2023-08-22 ENCOUNTER — Other Ambulatory Visit (HOSPITAL_COMMUNITY)
Admission: RE | Admit: 2023-08-22 | Discharge: 2023-08-22 | Disposition: A | Discharge status: Home / Self Care | Payer: BC Managed Care – PPO | Source: Ambulatory Visit | Attending: Family Medicine | Admitting: Family Medicine

## 2023-08-22 ENCOUNTER — Other Ambulatory Visit: Payer: Self-pay

## 2023-08-22 ENCOUNTER — Encounter: Payer: Self-pay | Admitting: Physician Assistant

## 2023-08-22 ENCOUNTER — Ambulatory Visit (INDEPENDENT_AMBULATORY_CARE_PROVIDER_SITE_OTHER): Payer: BC Managed Care – PPO | Admitting: Physician Assistant

## 2023-08-22 VITALS — BP 120/82 | HR 95 | Temp 97.8°F | Resp 16 | Ht 62.0 in | Wt 150.9 lb

## 2023-08-22 DIAGNOSIS — R102 Pelvic and perineal pain: Secondary | ICD-10-CM | POA: Diagnosis not present

## 2023-08-22 DIAGNOSIS — R300 Dysuria: Secondary | ICD-10-CM | POA: Insufficient documentation

## 2023-08-22 DIAGNOSIS — Z23 Encounter for immunization: Secondary | ICD-10-CM

## 2023-08-22 DIAGNOSIS — Z136 Encounter for screening for cardiovascular disorders: Secondary | ICD-10-CM

## 2023-08-22 DIAGNOSIS — Z0000 Encounter for general adult medical examination without abnormal findings: Secondary | ICD-10-CM

## 2023-08-22 DIAGNOSIS — Z113 Encounter for screening for infections with a predominantly sexual mode of transmission: Secondary | ICD-10-CM | POA: Diagnosis not present

## 2023-08-22 LAB — POCT URINALYSIS DIPSTICK
Glucose, UA: NEGATIVE
Ketones, UA: NEGATIVE
Nitrite, UA: NEGATIVE
Protein, UA: NEGATIVE
Spec Grav, UA: 1.025 (ref 1.010–1.025)
Urobilinogen, UA: 0.2 U/dL
pH, UA: 6 (ref 5.0–8.0)

## 2023-08-22 MED ORDER — PHENAZOPYRIDINE HCL 100 MG PO TABS
100.0000 mg | ORAL_TABLET | Freq: Three times a day (TID) | ORAL | 0 refills | Status: AC | PRN
Start: 2023-08-22 — End: ?
  Filled 2023-08-22: qty 10, 4d supply, fill #0

## 2023-08-22 NOTE — Progress Notes (Signed)
Your urine dipstick results were positive for trace leukocytes or white blood cells and some blood. This can sometimes be caused by a UTI but can also be found in vulvovaginal conditions such as bacterial vaginosis/ yeast if there is contamination. I have sent off a urine sample for culture and I will send in a script for the Pyridium to help with discomfort while we are waiting on further test results. If an antibiotic is indicated I will send this in for you once results are back

## 2023-08-22 NOTE — Progress Notes (Signed)
Annual Physical Exam   Name: Makayla Mason   MRN: [id number]    DOB: 1966/01/13   Date:08/22/2023  Today's Provider: Jacquelin Hawking, MHS, PA-C Introduced myself to the patient as a PA-C and provided education on APPs in clinical practice.         Subjective  Chief Complaint  Chief Complaint  Patient presents with   Annual Exam    HPI  Patient presents for annual CPE.  Diet: Overall normal diet- tries to reduce calorie intake for weight loss. Eating protein shake for breakfast and salad for lunch  Exercise: She tries to walk around the park 2x week   Sleep:She works second shift, getting about 6 hours per night, feels well rested in the AM  Mood:"Overall it's good"   She is concerned for UTI  She reports some burning and discomfort with urination  Started yesterday    Flowsheet Row Office Visit from 08/22/2023 in [facility]  AUDIT-C Score 0      Depression: Phq 9 is  positive    08/22/2023    8:21 AM 06/22/2023   11:44 AM 04/25/2023   11:48 AM 04/24/2023    9:03 AM 11/13/2022   10:57 AM  Depression screen PHQ 2/9  Decreased Interest 0 0 0 0 0  Down, Depressed, Hopeless 0 0 0 0 0  PHQ - 2 Score 0 0 0 0 0  Altered sleeping 0  0  0  Tired, decreased energy 0  0  0  Change in appetite 0  0  0  Feeling bad or failure about yourself  0  0  0  Trouble concentrating 0  0  0  Moving slowly or fidgety/restless 0  0  0  Suicidal thoughts 0  0  0  PHQ-9 Score 0  0  0  Difficult doing work/chores Not difficult at all  Not difficult at all  Not difficult at all   Hypertension: BP Readings from Last 3 Encounters:  08/22/23 120/82  06/22/23 120/82  04/25/23 122/84   Obesity: Wt Readings from Last 3 Encounters:  08/22/23 150 lb 14.4 oz (68.4 kg)  06/22/23 150 lb 4.8 oz (68.2 kg)  04/25/23 150 lb 6.4 oz (68.2 kg)   BMI Readings from Last 3 Encounters:  08/22/23 27.60 kg/m  06/22/23 27.49 kg/m  04/25/23 27.51 kg/m     Vaccines:    HPV: aged out Tdap: UTD Shingrix: completed  Pneumonia: NA Flu: provided today  COVID-19: Discussed vaccine and booster recommendations per available CDC guidelines     Hep C Screening: completed  HIV screening: completed STD testing and prevention (HIV/chl/gon/syphilis): she would like to be screened for STDs Intimate partner violence: negative Sexual History : she is sexually active with new female partner  Menstrual History/LMP/Abnormal Bleeding: She is no longer having periods- denies postmenopausal bleeding or spotting  Discussed importance of follow up if any post-menopausal bleeding: yes Incontinence Symptoms: No.  Breast cancer hx:  - Last Mammogram: UTD- completed this year  - BRCA gene screening:   Osteoporosis Prevention : Discussed high calcium and vitamin D supplementation, weight bearing exercises Bone density :not applicable  Cervical cancer screening: UTD- last completed in 2022- NILM next due in 2025  Skin cancer: Discussed monitoring for atypical lesions  Colorectal cancer screening: UTD- next due in 2029    Lung cancer:  Low Dose CT Chest recommended if Age 86-80 years, 20 pack-year currently smoking OR have  quit w/in 15years. Patient does not qualify.   ECG: NA  Advanced Care Planning: A voluntary discussion about advance care planning including the explanation and discussion of advance directives.  Discussed health care proxy and Living will, and the patient was able to identify a health care proxy as her daughter Patsy Lager and Alphonzo Lemmings.  Patient does not have a living will in effect.  Lipids: Lab Results  Component Value Date   CHOL 186 08/11/2022   CHOL 169 08/10/2021   CHOL 187 06/08/2020   Lab Results  Component Value Date   HDL 53 08/11/2022   HDL 56 08/10/2021   HDL 52 06/08/2020   Lab Results  Component Value Date   LDLCALC 112 (H) 08/11/2022   LDLCALC 96 08/10/2021   LDLCALC 114 (H) 06/08/2020   Lab Results  Component Value Date    TRIG 106 08/11/2022   TRIG 77 08/10/2021   TRIG 115 06/08/2020   Lab Results  Component Value Date   CHOLHDL 3.5 08/11/2022   CHOLHDL 3.0 08/10/2021   CHOLHDL 3.6 06/08/2020   No results found for: "LDLDIRECT"  Glucose: Glucose, Bld  Date Value Ref Range Status  03/04/2023 88 70 - 99 mg/dL Final    Comment:    Glucose reference range applies only to samples taken after fasting for at least 8 hours.  08/11/2022 75 65 - 99 mg/dL Final    Comment:    .            Fasting reference interval .   08/10/2021 88 65 - 99 mg/dL Final    Comment:    .            Fasting reference interval .     Patient Active Problem List   Diagnosis Date Noted   Vasomotor symptoms due to menopause 08/11/2022   Sprain of ulnar collateral ligament of metacarpophalangeal (MCP) joint of right thumb 06/08/2020   Cervical radicular pain 07/15/2018   Overweight (BMI 25.0-29.9) 01/29/2018   Right hip pain 01/29/2018   Insulin resistance 02/28/2017   Low grade squamous intraepith lesion on cytologic smear cervix (lgsil) 02/28/2017   GERD without esophagitis 03/15/2016   Left ankle pain 03/15/2016   Allergic rhinitis, seasonal 05/21/2015   Scoliosis 05/21/2015   Arthralgia of temporomandibular joint 05/21/2015    Past Surgical History:  Procedure Laterality Date   COLONOSCOPY WITH PROPOFOL N/A 02/15/2018   Procedure: COLONOSCOPY WITH PROPOFOL;  Surgeon: Wyline Mood, MD;  Location: [facility] ENDOSCOPY;  Service: Gastroenterology;  Laterality: N/A;    Family History  Problem Relation Age of Onset   Breast cancer Cousin    Diabetes Mother    Heart murmur Mother    Diabetes Father    Hypertension Father    Asthma Daughter    Anemia Maternal Aunt    Stroke Maternal Aunt    Kidney disease Maternal Grandmother    Hypertension Maternal Grandmother    Heart failure Maternal Grandmother    Heart attack Maternal Grandfather     Social History   Socioeconomic History   Marital status: Divorced     Spouse name: Not on file   Number of children: 2   Years of education: Not on file   Highest education level: Bachelor's degree (e.g., BA, AB, BS)  Occupational History   Not on file  Tobacco Use   Smoking status: Never   Smokeless tobacco: Never  Vaping Use   Vaping status: Never Used  Substance and Sexual Activity   Alcohol  use: Not Currently    Alcohol/week: 1.0 standard drink of alcohol    Types: 1 Glasses of wine per week   Drug use: No   Sexual activity: Yes    Birth control/protection: Post-menopausal  Other Topics Concern   Not on file  Social History Narrative   Lives in [place], grown daughters and grandson are at home   Social Determinants of Health   Financial Resource Strain: Medium Risk (08/22/2023)   Overall Financial Resource Strain (CARDIA)    Difficulty of Paying Living Expenses: Somewhat hard  Food Insecurity: Food Insecurity Present (08/22/2023)   Hunger Vital Sign    Worried About Running Out of Food in the Last Year: Sometimes true    Ran Out of Food in the Last Year: Sometimes true  Transportation Needs: No Transportation Needs (08/22/2023)   PRAPARE - Administrator, Civil Service (Medical): No    Lack of Transportation (Non-Medical): No  Physical Activity: Sufficiently Active (08/22/2023)   Exercise Vital Sign    Days of Exercise per Week: 3 days    Minutes of Exercise per Session: 150+ min  Stress: Stress Concern Present (08/22/2023)   [employer] of Occupational Health - Occupational Stress Questionnaire    Feeling of Stress : To some extent  Social Connections: Moderately Integrated (08/22/2023)   Social Connection and Isolation Panel [NHANES]    Frequency of Communication with Friends and Family: More than three times a week    Frequency of Social Gatherings with Friends and Family: More than three times a week    Attends Religious Services: More than 4 times per year    Active Member of [employer] or Organizations: Yes     Attends Engineer, structural: More than 4 times per year    Marital Status: Divorced  Intimate Partner Violence: Not At Risk (08/22/2023)   Humiliation, Afraid, Rape, and Kick questionnaire    Fear of Current or Ex-Partner: No    Emotionally Abused: No    Physically Abused: No    Sexually Abused: No     Current Outpatient Medications:    EPINEPHrine (EPIPEN 2-PAK) 0.3 mg/0.3 mL IJ SOAJ injection, USE AS DIRECTED, Disp: 2 each, Rfl: 0   levocetirizine (XYZAL) 5 MG tablet, Take 1 tablet (5 mg total) by mouth every evening., Disp: 90 tablet, Rfl: 0   montelukast (SINGULAIR) 10 MG tablet, TAKE 1 TABLET BY MOUTH AT BEDTIME., Disp: 90 tablet, Rfl: 3   omeprazole (PRILOSEC) 40 MG capsule, TAKE 1 CAPSULE BY MOUTH DAILY., Disp: 90 capsule, Rfl: 1   triamcinolone (NASACORT) 55 MCG/ACT AERO nasal inhaler, 2 spray in each nostril daily, Disp: 16.9 mL, Rfl: 11   venlafaxine XR (EFFEXOR XR) 37.5 MG 24 hr capsule, Take 1 capsule (37.5 mg total) by mouth daily with breakfast., Disp: 30 capsule, Rfl: 0   venlafaxine XR (EFFEXOR XR) 75 MG 24 hr capsule, Take 1 capsule (75 mg total) by mouth daily with breakfast., Disp: 90 capsule, Rfl: 1   chlorhexidine (HIBICLENS) 4 % external liquid, Apply topically 2 (two) times daily as needed (soaks to finger for inflammation infection for 3-5 days). (Patient not taking: Reported on 08/22/2023), Disp: 237 mL, Rfl: 0   estrogen, conjugated,-medroxyprogesterone (PREMPRO) 0.625-5 MG tablet, Take 1 tablet by mouth once daily (Patient not taking: Reported on 08/22/2023), Disp: 28 tablet, Rfl: 11   mupirocin ointment (BACTROBAN) 2 %, Apply 1 Application topically 2 (two) times daily. (Patient not taking: Reported on 08/22/2023), Disp: 22 g, Rfl:  0  Allergies  Allergen Reactions   Codeine Nausea And Vomiting     Review of Systems  Constitutional:  Negative for chills, fever, malaise/fatigue and weight loss.  HENT:  Negative for hearing loss, nosebleeds, sore  throat and tinnitus.   Eyes:  Negative for blurred vision, double vision and photophobia.  Respiratory:  Negative for shortness of breath and wheezing.   Cardiovascular:  Negative for chest pain, palpitations and leg swelling.  Gastrointestinal:  Positive for heartburn. Negative for blood in stool, constipation, diarrhea, nausea and vomiting.  Genitourinary:  Positive for dysuria and frequency. Negative for flank pain and hematuria.  Musculoskeletal:  Negative for falls.  Skin:  Positive for rash (on left pectoral area of chest- appears consistent with dry skin). Negative for itching.  Neurological:  Positive for headaches. Negative for dizziness, tingling, tremors and loss of consciousness.  Psychiatric/Behavioral:  Negative for depression. The patient is not nervous/anxious.       Objective  Vitals:   08/22/23 0826  BP: 120/82  Pulse: 95  Resp: 16  Temp: 97.8 F (36.6 C)  TempSrc: Oral  SpO2: 99%  Weight: 150 lb 14.4 oz (68.4 kg)  Height: 5\' 2"  (1.575 m)    Body mass index is 27.6 kg/m.  Physical Exam Vitals reviewed.  Constitutional:      General: She is awake.     Appearance: Normal appearance. She is well-developed and well-groomed.  HENT:     Head: Normocephalic and atraumatic.     Right Ear: Tympanic membrane, ear canal and external ear normal.     Left Ear: Tympanic membrane, ear canal and external ear normal.     Nose: Nose normal.     Mouth/Throat:     Mouth: Mucous membranes are moist.     Pharynx: No pharyngeal swelling, oropharyngeal exudate, posterior oropharyngeal erythema or postnasal drip.  Eyes:     Extraocular Movements: Extraocular movements intact.     Conjunctiva/sclera: Conjunctivae normal.     Pupils: Pupils are equal, round, and reactive to light.  Neck:     Thyroid: No thyroid mass, thyromegaly or thyroid tenderness.  Cardiovascular:     Rate and Rhythm: Normal rate and regular rhythm.     Pulses: Normal pulses.          Radial pulses  are 2+ on the right side and 2+ on the left side.     Heart sounds: Normal heart sounds. No murmur heard.    No friction rub. No gallop.  Pulmonary:     Effort: Pulmonary effort is normal.     Breath sounds: Normal breath sounds. No decreased air movement. No decreased breath sounds, wheezing, rhonchi or rales.  Abdominal:     General: Abdomen is flat. Bowel sounds are normal.     Palpations: Abdomen is soft.     Tenderness: There is no abdominal tenderness. There is no right CVA tenderness or left CVA tenderness.  Musculoskeletal:        General: Normal range of motion.     Cervical back: Normal range of motion and neck supple.     Right lower leg: No edema.     Left lower leg: No edema.  Lymphadenopathy:     Head:     Right side of head: No submental, submandibular or preauricular adenopathy.     Left side of head: No submental, submandibular or preauricular adenopathy.     Cervical:     Right cervical: No superficial cervical adenopathy.  Left cervical: No superficial cervical adenopathy.     Upper Body:     Right upper body: No supraclavicular adenopathy.     Left upper body: No supraclavicular adenopathy.  Skin:    General: Skin is warm and dry.     Capillary Refill: Capillary refill takes less than 2 seconds.  Neurological:     General: No focal deficit present.     Mental Status: She is alert and oriented to person, place, and time. Mental status is at baseline.     GCS: GCS eye subscore is 4. GCS verbal subscore is 5. GCS motor subscore is 6.     Cranial Nerves: No cranial nerve deficit, dysarthria or facial asymmetry.     Motor: No weakness, tremor or atrophy.     Gait: Gait is intact.  Psychiatric:        Attention and Perception: Attention and perception normal.        Mood and Affect: Mood and affect normal.        Speech: Speech normal.        Behavior: Behavior normal. Behavior is cooperative.        Thought Content: Thought content normal.        Cognition  and Memory: Cognition normal.        Judgment: Judgment normal.      Recent Results (from the past 2160 hour(s))  Novel Coronavirus, NAA ([facility])     Status: None   Collection Time: 06/22/23 12:00 AM   Specimen: Nasopharyngeal(NP) swabs in vial transport medium   Nasopharynge  Previous  Result Value Ref Range   SARS-CoV-2, NAA Not Detected Not Detected    Comment: This nucleic acid amplification test was developed and its performance characteristics determined by [employer]. Nucleic acid amplification tests include RT-PCR and TMA. This test has not been FDA cleared or approved. This test has been authorized by FDA under an Emergency Use Authorization (EUA). This test is only authorized for the duration of time the declaration that circumstances exist justifying the authorization of the emergency use of in vitro diagnostic tests for detection of SARS-CoV-2 virus and/or diagnosis of COVID-19 infection under section 564(b)(1) of the Act, 21 U.S.C. 562ZHY-8(M) (1), unless the authorization is terminated or revoked sooner. When diagnostic testing is negative, the possibility of a false negative result should be considered in the context of a patient's recent exposures and the presence of clinical signs and symptoms consistent with COVID-19. An individual without symptoms of COVID-19 and who is not shedding SARS-CoV-2 virus wo uld expect to have a negative (not detected) result in this assay.   POCT urinalysis dipstick     Status: Abnormal   Collection Time: 08/22/23  9:28 AM  Result Value Ref Range   Color, UA Yellow    Clarity, UA Cloudy    Glucose, UA Negative Negative   Bilirubin, UA small    Ketones, UA Negative    Spec Grav, UA 1.025 1.010 - 1.025   Blood, UA Moderate    pH, UA 6.0 5.0 - 8.0   Protein, UA Negative Negative   Urobilinogen, UA 0.2 0.2 or 1.0 E.U./dL   Nitrite, UA Negative    Leukocytes, UA Trace (A) Negative   Appearance Hazy    Odor Foul       Fall Risk:    08/22/2023    8:20 AM 06/22/2023   11:44 AM 04/25/2023   11:48 AM 04/24/2023    9:03 AM 11/13/2022   10:57  AM  Fall Risk   Falls in the past year? 0 0 0 0 0  Number falls in past yr: 0 0 0 0 0  Injury with Fall? 0 0 0 0 0  Risk for fall due to : No Fall Risks  No Fall Risks No Fall Risks No Fall Risks  Follow up Falls prevention discussed;Education provided;Falls evaluation completed  Falls prevention discussed;Education provided;Falls evaluation completed Falls evaluation completed Falls prevention discussed;Education provided;Falls evaluation completed     Functional Status Survey: Is the patient deaf or have difficulty hearing?: No Does the patient have difficulty seeing, even when wearing glasses/contacts?: No Does the patient have difficulty concentrating, remembering, or making decisions?: No Does the patient have difficulty walking or climbing stairs?: No Does the patient have difficulty dressing or bathing?: No Does the patient have difficulty doing errands alone such as visiting a doctor's office or shopping?: No   Assessment & Plan  Problem List Items Addressed This Visit   None Visit Diagnoses     Well adult exam    -  Primary -USPSTF grade A and B recommendations reviewed with patient; age-appropriate recommendations, preventive care, screening tests, etc discussed and encouraged; healthy living encouraged; see AVS for patient education given to patient -Discussed importance of 150 minutes of physical activity weekly, eat two servings of fish weekly, eat one serving of tree nuts ( cashews, pistachios, pecans, almonds.Marland Kitchen) every other day, eat 6 servings of fruit/vegetables daily and drink plenty of water and avoid sweet beverages.   -Reviewed Health Maintenance: Yes.     Relevant Orders   COMPLETE METABOLIC PANEL WITH GFR   CBC w/Diff/Platelet   Lipid Profile   HgB A1c   TSH   Vulvovaginal discomfort       Relevant Orders   Cervicovaginal  ancillary only   Dysuria     Acute, new concern Will get urine dip and cervicovaginal swab  Results to dictate further management  Dip was concerning for leukocytes and RBC- will send for urine culture for ID and susceptibility testing Will hold off on abx until culture results and cervicovaginal swab are back Follow up as needed for persistent or progressing symptoms     Relevant Orders   Cervicovaginal ancillary only   POCT urinalysis dipstick (Completed)   Urine Culture   Need for influenza vaccination       Relevant Orders   Flu vaccine trivalent PF, 6mos and older(Flulaval,Afluria,Fluarix,Fluzone) (Completed)   Screening for STD (sexually transmitted disease)       Relevant Orders   Cervicovaginal ancillary only   HIV antibody (with reflex)   RPR   Screening for ischemic heart disease       Relevant Orders   Lipid Profile        No follow-ups on file.   I, Danise Dehne E Caytlyn Evers, PA-C, have reviewed all documentation for this visit. The documentation on 08/22/23 for the exam, diagnosis, procedures, and orders are all accurate and complete.   Jacquelin Hawking, MHS, PA-C [facility]

## 2023-08-23 ENCOUNTER — Other Ambulatory Visit: Payer: Self-pay

## 2023-08-23 ENCOUNTER — Ambulatory Visit: Payer: Self-pay

## 2023-08-23 LAB — CERVICOVAGINAL ANCILLARY ONLY
Bacterial Vaginitis (gardnerella): NEGATIVE
Candida Glabrata: NEGATIVE
Candida Vaginitis: NEGATIVE
Chlamydia: NEGATIVE
Comment: NEGATIVE
Comment: NEGATIVE
Comment: NEGATIVE
Comment: NEGATIVE
Comment: NEGATIVE
Comment: NORMAL
Neisseria Gonorrhea: NEGATIVE
Trichomonas: NEGATIVE

## 2023-08-23 LAB — URINE CULTURE
MICRO NUMBER:: 15604546
Result:: NO GROWTH
SPECIMEN QUALITY:: ADEQUATE

## 2023-08-23 NOTE — Telephone Encounter (Signed)
  Chief Complaint: medication assistance  Symptoms: urinary discomfort Frequency:  Pertinent Negatives: NA Disposition: [] ED /[] Urgent Care (no appt availability in office) / [] Appointment(In office/virtual)/ []  [facility]/ [x] Home Care/ [] Refused Recommended Disposition /[] [facility]/ []  Follow-up with PCP Additional Notes: pt states she seen where AZO was sent in for discomfort but didn't understand why abx wasn't sent in for infection. Advised of [place], [place] OV notes yesterday and explained urine culture to pt so she understood. Pt hasn't started the Pyridium yet d/t being at work. Pt will wait and try Pyiridium and see if that helps and will wait for call back on culture results or if sx get worse will call back.   Dysuria     Acute, new concern Will get urine dip and cervicovaginal swab  Results to dictate further management  Dip was concerning for leukocytes and RBC- will send for urine culture for ID and susceptibility testing Will hold off on abx until culture results and cervicovaginal swab are back Follow up as needed for persistent or progressing symptoms   Summary: patient states no medication for infection was sent in, only for pain   Patient states she saw Denny Peon Mecum yesterday 10.16.2024 for a UTI and Erin Mecum sent over medication for pain and discomfort but nothing for the infection itself and she needs something called in for the infection itself.  Patients callback # [phone]         Reason for Disposition  Caller has medicine question only, adult not sick, AND triager answers question  Answer Assessment - Initial Assessment Questions 1. NAME of MEDICINE: "What medicine(s) are you calling about?"     Abx for UTI  2. QUESTION: "What is your question?" (e.g., double dose of medicine, side effect)     Wanting to know why ABX was not prescribed only the AZO 3. PRESCRIBER: "Who prescribed the medicine?" Reason: if prescribed by specialist,  call should be referred to that group.     Erin, PA 4. SYMPTOMS: "Do you have any symptoms?" If Yes, ask: "What symptoms are you having?"  "How bad are the symptoms (e.g., mild, moderate, severe)     Urinary discomfort  Protocols used: Medication Question Call-A-AH

## 2023-08-24 NOTE — Progress Notes (Signed)
Your urine culture results are back.  There was no bacterial growth present which indicates that there is not a UTI at this time. Your cervicovaginal swab was negative for gonorrhea, chlamydia, trichomonas, bacterial vaginosis, vaginal yeast infection.  At this time an antibiotic is not indicated.  If you are continuing to have symptoms please let us know so we can look into further workup

## 2023-08-25 LAB — CBC WITH DIFFERENTIAL/PLATELET
Absolute Lymphocytes: 2796 {cells}/uL (ref 850–3900)
Absolute Monocytes: 402 {cells}/uL (ref 200–950)
Basophils Absolute: 37 {cells}/uL (ref 0–200)
Basophils Relative: 0.5 %
Eosinophils Absolute: 277 {cells}/uL (ref 15–500)
Eosinophils Relative: 3.8 %
HCT: 43.4 % (ref 35.0–45.0)
Hemoglobin: 13.7 g/dL (ref 11.7–15.5)
MCH: 27.8 pg (ref 27.0–33.0)
MCHC: 31.6 g/dL — ABNORMAL LOW (ref 32.0–36.0)
MCV: 88 fL (ref 80.0–100.0)
MPV: 10.7 fL (ref 7.5–12.5)
Monocytes Relative: 5.5 %
Neutro Abs: 3789 {cells}/uL (ref 1500–7800)
Neutrophils Relative %: 51.9 %
Platelets: 387 10*3/uL (ref 140–400)
RBC: 4.93 10*6/uL (ref 3.80–5.10)
RDW: 13 % (ref 11.0–15.0)
Total Lymphocyte: 38.3 %
WBC: 7.3 10*3/uL (ref 3.8–10.8)

## 2023-08-25 LAB — COMPLETE METABOLIC PANEL WITH GFR
AG Ratio: 1.5 (calc) (ref 1.0–2.5)
ALT: 11 U/L (ref 6–29)
AST: 12 U/L (ref 10–35)
Albumin: 4.2 g/dL (ref 3.6–5.1)
Alkaline phosphatase (APISO): 98 U/L (ref 37–153)
BUN: 14 mg/dL (ref 7–25)
CO2: 29 mmol/L (ref 20–32)
Calcium: 9.5 mg/dL (ref 8.6–10.4)
Chloride: 106 mmol/L (ref 98–110)
Creat: 0.64 mg/dL (ref 0.50–1.03)
Globulin: 2.8 g/dL (ref 1.9–3.7)
Glucose, Bld: 122 mg/dL — ABNORMAL HIGH (ref 65–99)
Potassium: 4.5 mmol/L (ref 3.5–5.3)
Sodium: 145 mmol/L (ref 135–146)
Total Bilirubin: 0.3 mg/dL (ref 0.2–1.2)
Total Protein: 7 g/dL (ref 6.1–8.1)
eGFR: 102 mL/min/{1.73_m2} (ref >=60)

## 2023-08-25 LAB — TSH: TSH: 1.66 m[IU]/L (ref 0.40–4.50)

## 2023-08-25 LAB — LIPID PANEL
Cholesterol: 175 mg/dL (ref <200)
HDL: 59 mg/dL (ref >=50)
LDL Cholesterol (Calc): 97 mg/dL
Non-HDL Cholesterol (Calc): 116 mg/dL (ref <130)
Total CHOL/HDL Ratio: 3 (calc) (ref <5.0)
Triglycerides: 93 mg/dL (ref <150)

## 2023-08-25 LAB — HEMOGLOBIN A1C
Hgb A1c MFr Bld: 6.1 %{Hb} — ABNORMAL HIGH (ref <5.7)
Mean Plasma Glucose: 128 mg/dL
eAG (mmol/L): 7.1 mmol/L

## 2023-08-25 LAB — RPR: RPR Ser Ql: NONREACTIVE

## 2023-08-25 LAB — HIV ANTIBODY (ROUTINE TESTING W REFLEX): HIV 1&2 Ab, 4th Generation: NONREACTIVE

## 2023-08-27 ENCOUNTER — Encounter: Payer: Self-pay | Admitting: Physician Assistant

## 2023-08-27 DIAGNOSIS — J301 Allergic rhinitis due to pollen: Secondary | ICD-10-CM | POA: Diagnosis not present

## 2023-08-27 DIAGNOSIS — R7303 Prediabetes: Secondary | ICD-10-CM | POA: Insufficient documentation

## 2023-08-27 NOTE — Progress Notes (Signed)
Your labs are back Your electrolytes, liver and kidney function were overall normal at this time Your CBC was overall normal- no signs of anemia Your A1c remains in prediabetic range at 6.1 but has increased since last year. Medications are not indicated at this time but I highly encourage you to reduce your consumption of excess sugar and carbs and make sure you are exercising regularly throughout the week to reduce the risk of progression to diabetes Your cholesterol looks good. Your thyroid testing was in normal range Your HIV and syphilis testing were negative.

## 2023-09-03 DIAGNOSIS — J301 Allergic rhinitis due to pollen: Secondary | ICD-10-CM | POA: Diagnosis not present

## 2023-09-10 DIAGNOSIS — J301 Allergic rhinitis due to pollen: Secondary | ICD-10-CM | POA: Diagnosis not present

## 2023-09-17 DIAGNOSIS — J301 Allergic rhinitis due to pollen: Secondary | ICD-10-CM | POA: Diagnosis not present

## 2023-09-24 DIAGNOSIS — J301 Allergic rhinitis due to pollen: Secondary | ICD-10-CM | POA: Diagnosis not present

## 2023-10-01 DIAGNOSIS — J301 Allergic rhinitis due to pollen: Secondary | ICD-10-CM | POA: Diagnosis not present

## 2023-10-08 DIAGNOSIS — J301 Allergic rhinitis due to pollen: Secondary | ICD-10-CM | POA: Diagnosis not present

## 2023-10-15 DIAGNOSIS — J301 Allergic rhinitis due to pollen: Secondary | ICD-10-CM | POA: Diagnosis not present

## 2023-10-22 DIAGNOSIS — J301 Allergic rhinitis due to pollen: Secondary | ICD-10-CM | POA: Diagnosis not present

## 2023-10-23 ENCOUNTER — Other Ambulatory Visit: Payer: Self-pay

## 2023-10-23 DIAGNOSIS — J301 Allergic rhinitis due to pollen: Secondary | ICD-10-CM | POA: Diagnosis not present

## 2023-10-23 MED ORDER — MONTELUKAST SODIUM 10 MG PO TABS
10.0000 mg | ORAL_TABLET | Freq: Every day | ORAL | 11 refills | Status: AC
  Filled 2023-10-23 – 2023-11-12 (×2): qty 30, 30d supply, fill #0
  Filled 2024-01-09: qty 30, 30d supply, fill #1
  Filled 2024-02-04: qty 30, 30d supply, fill #2
  Filled 2024-02-25 – 2024-02-27 (×3): qty 30, 30d supply, fill #3
  Filled 2024-04-09 (×2): qty 30, 30d supply, fill #4
  Filled 2024-05-19: qty 30, 30d supply, fill #5
  Filled 2024-06-18 (×2): qty 30, 30d supply, fill #6
  Filled 2024-07-17 – 2024-07-28 (×2): qty 30, 30d supply, fill #7
  Filled 2024-09-01: qty 30, 30d supply, fill #8

## 2023-10-23 MED ORDER — TRIAMCINOLONE ACETONIDE 55 MCG/ACT NA AERO
2.0000 | INHALATION_SPRAY | Freq: Every day | NASAL | 3 refills | Status: AC
Start: 2023-10-23 — End: ?
  Filled 2023-10-23 – 2023-11-12 (×2): qty 16.9, 30d supply, fill #0
  Filled 2024-01-09: qty 16.9, 30d supply, fill #1
  Filled 2024-02-05: qty 16.9, 30d supply, fill #2
  Filled 2024-02-25: qty 16.9, 30d supply, fill #3
  Filled 2024-04-09: qty 16.9, 30d supply, fill #4
  Filled 2024-05-19: qty 16.9, 30d supply, fill #5
  Filled 2024-06-18 (×2): qty 16.9, 30d supply, fill #6
  Filled 2024-07-17 – 2024-09-01 (×3): qty 16.9, 30d supply, fill #7

## 2023-10-23 MED ORDER — LEVOCETIRIZINE DIHYDROCHLORIDE 5 MG PO TABS
5.0000 mg | ORAL_TABLET | Freq: Every day | ORAL | 3 refills | Status: AC
  Filled 2023-10-23 – 2023-11-12 (×2): qty 90, 90d supply, fill #0
  Filled 2024-01-09 (×3): qty 90, 90d supply, fill #1
  Filled 2024-02-25 – 2024-05-19 (×2): qty 90, 90d supply, fill #2
  Filled 2024-08-18: qty 90, 90d supply, fill #3

## 2023-10-24 DIAGNOSIS — J301 Allergic rhinitis due to pollen: Secondary | ICD-10-CM | POA: Diagnosis not present

## 2023-10-29 DIAGNOSIS — J301 Allergic rhinitis due to pollen: Secondary | ICD-10-CM | POA: Diagnosis not present

## 2023-11-02 ENCOUNTER — Other Ambulatory Visit: Payer: Self-pay | Admitting: Family Medicine

## 2023-11-02 ENCOUNTER — Other Ambulatory Visit: Payer: Self-pay

## 2023-11-02 ENCOUNTER — Ambulatory Visit: Payer: Self-pay | Admitting: *Deleted

## 2023-11-02 DIAGNOSIS — K219 Gastro-esophageal reflux disease without esophagitis: Secondary | ICD-10-CM

## 2023-11-02 MED ORDER — OMEPRAZOLE 40 MG PO CPDR
40.0000 mg | DELAYED_RELEASE_CAPSULE | Freq: Every day | ORAL | 1 refills | Status: AC
  Filled 2023-11-02 – 2023-11-12 (×2): qty 90, 90d supply, fill #0
  Filled 2024-02-05: qty 90, 90d supply, fill #1

## 2023-11-02 NOTE — Telephone Encounter (Signed)
Attempted to return her call.   Left a voicemail to call back. 

## 2023-11-02 NOTE — Telephone Encounter (Signed)
Message from Arnold Line H sent at 11/02/2023  9:56 AM EST  Summary: poss UTI   Pt thinks she may have UTI.  Pt has burning w/ urination, back pain, looking for an appt today.          Call History  Contact Date/Time Type Contact Phone/Fax By  11/02/2023 09:55 AM EST Phone (Incoming) Mar[place], [place] (Self) [phone] Judie Petit) Crist Infante

## 2023-11-02 NOTE — Telephone Encounter (Signed)
Reason for Disposition  Age > 50 years    Stopped by the office while out running errands and got an appt for 11/05/2023 with Denny Peon Mecum, PA-C for 9:20.  Answer Assessment - Initial Assessment Questions 1. SEVERITY: "How bad is the pain?"  (e.g., Scale 1-10; mild, moderate, or severe)   - MILD (1-3): complains slightly about urination hurting   - MODERATE (4-7): interferes with normal activities     - SEVERE (8-10): excruciating, unwilling or unable to urinate because of the pain      Burning with urination.    My lower back is aching too.   I get these symptoms with UTI. I was out running errands today and stopped by [employer].   They made me an appt for Monday with Erin Mecum, PA-C at 9:20 on 11/05/2023.    She did ask what she could take for the burning over the weekend.   I told her about AZO she could buy OTC.    No further triage done.    She thanked me for the recommendation for the AZO.    2. FREQUENCY: "How many times have you had painful urination today?"      Every time 3. PATTERN: "Is pain present every time you urinate or just sometimes?"      Every time 4. ONSET: "When did the painful urination start?"      No triage done beyond this since has an appt for Monday.  5. FEVER: "Do you have a fever?" If Yes, ask: "What is your temperature, how was it measured, and when did it start?"      6. PAST UTI: "Have you had a urine infection before?" If Yes, ask: "When was the last time?" and "What happened that time?"       7. CAUSE: "What do you think is causing the painful urination?"  (e.g., UTI, scratch, Herpes sore)      8. OTHER SYMPTOMS: "Do you have any other symptoms?" (e.g., blood in urine, flank pain, genital sores, urgency, vaginal discharge)      9. PREGNANCY: "Is there any chance you are pregnant?" "When was your last menstrual period?"  Protocols used: Urination Pain - Female-A-AH  Chief Complaint: UTI symptoms like she gets   She was out running errands today so  stopped by [facility] and made an appt for Monday 11/05/2023.   So complete triage not done. Symptoms: lower back aching, burning with urination. Frequency:  Pertinent Negatives: Patient denies  Disposition: [] ED /[] Urgent Care (no appt availability in office) / [x] Appointment(In office/virtual)/ []  [facility]/ [] Home Care/ [] Refused Recommended Disposition /[] [place] [facility]/ []  Follow-up with PCP Additional Notes: Has appt for Monday 11/05/2023 with Erin Mecum, PA-C for 9:20.

## 2023-11-05 ENCOUNTER — Ambulatory Visit: Payer: BC Managed Care – PPO | Admitting: Physician Assistant

## 2023-11-05 DIAGNOSIS — J301 Allergic rhinitis due to pollen: Secondary | ICD-10-CM | POA: Diagnosis not present

## 2023-11-06 ENCOUNTER — Telehealth: Payer: Self-pay | Admitting: *Deleted

## 2023-11-06 ENCOUNTER — Ambulatory Visit (INDEPENDENT_AMBULATORY_CARE_PROVIDER_SITE_OTHER): Payer: BC Managed Care – PPO | Admitting: Physician Assistant

## 2023-11-06 ENCOUNTER — Encounter: Payer: Self-pay | Admitting: Physician Assistant

## 2023-11-06 ENCOUNTER — Other Ambulatory Visit (HOSPITAL_COMMUNITY)
Admission: RE | Admit: 2023-11-06 | Discharge: 2023-11-06 | Disposition: A | Discharge status: Home / Self Care | Payer: BC Managed Care – PPO | Source: Ambulatory Visit | Attending: Physician Assistant | Admitting: Physician Assistant

## 2023-11-06 VITALS — BP 118/76 | HR 86 | Resp 16 | Ht 64.0 in | Wt 148.0 lb

## 2023-11-06 DIAGNOSIS — N760 Acute vaginitis: Secondary | ICD-10-CM | POA: Diagnosis not present

## 2023-11-06 DIAGNOSIS — B9689 Other specified bacterial agents as the cause of diseases classified elsewhere: Secondary | ICD-10-CM

## 2023-11-06 DIAGNOSIS — R300 Dysuria: Secondary | ICD-10-CM | POA: Insufficient documentation

## 2023-11-06 LAB — POCT URINALYSIS DIPSTICK
Appearance: NORMAL
Bilirubin, UA: NEGATIVE
Blood, UA: NEGATIVE
Glucose, UA: NEGATIVE
Ketones, UA: NEGATIVE
Leukocytes, UA: NEGATIVE
Nitrite, UA: NEGATIVE
Protein, UA: NEGATIVE
Spec Grav, UA: >=1.03 — AB (ref 1.010–1.025)
Urobilinogen, UA: 0.2 U/dL
pH, UA: 6 (ref 5.0–8.0)

## 2023-11-06 NOTE — Progress Notes (Signed)
 Acute Office Visit   Patient: Makayla Mason   DOB: 1966-02-15   57 y.o. Female  MRN: [id number] Visit Date: 11/06/2023  Today's healthcare provider: Rocky BRAVO Brentley Horrell, PA-C  Introduced myself to the patient as a PA-C and provided education on APPs in clinical practice.    Chief Complaint  Patient presents with   Dysuria    Started 2 weeks ago, getting better   Subjective    HPI HPI     Dysuria    Additional comments: Started 2 weeks ago, getting better      Last edited by Dann Kirsch, CMA on 11/06/2023 10:41 AM.      Concern for pain with urination  Onset: gradual  Duration: started last week but has resolved as of today   She reports she was having some burning with urination and flank pain last week but this has resolved She was also concerned for darker colored urine but this has improved as well   Medications: Outpatient Medications Prior to Visit  Medication Sig   EPINEPHrine  (EPIPEN  2-PAK) 0.3 mg/0.3 mL IJ SOAJ injection USE AS DIRECTED   levocetirizine (XYZAL ) 5 MG tablet Take 1 tablet (5 mg total) by mouth every evening.   levocetirizine (XYZAL ) 5 MG tablet Take 1 tablet (5 mg total) by mouth daily.   montelukast  (SINGULAIR ) 10 MG tablet Take 1 tablet (10 mg total) by mouth daily.   omeprazole  (PRILOSEC) 40 MG capsule Take 1 capsule (40 mg total) by mouth daily.   phenazopyridine  (PYRIDIUM ) 100 MG tablet Take 1 tablet (100 mg total) by mouth 3 (three) times daily as needed for pain.   triamcinolone  (NASACORT ) 55 MCG/ACT AERO nasal inhaler 2 spray in each nostril daily   triamcinolone  (NASACORT ) 55 MCG/ACT AERO nasal inhaler Place 2 sprays into each nostril daily.   venlafaxine  XR (EFFEXOR  XR) 37.5 MG 24 hr capsule Take 1 capsule (37.5 mg total) by mouth daily with breakfast.   venlafaxine  XR (EFFEXOR  XR) 75 MG 24 hr capsule Take 1 capsule (75 mg total) by mouth daily with breakfast.   chlorhexidine  (HIBICLENS ) 4 % external liquid Apply topically 2  (two) times daily as needed (soaks to finger for inflammation infection for 3-5 days). (Patient not taking: Reported on 11/06/2023)   estrogen, conjugated,-medroxyprogesterone (PREMPRO ) 0.625-5 MG tablet Take 1 tablet by mouth once daily (Patient not taking: Reported on 11/06/2023)   montelukast  (SINGULAIR ) 10 MG tablet TAKE 1 TABLET BY MOUTH AT BEDTIME.   mupirocin  ointment (BACTROBAN ) 2 % Apply 1 Application topically 2 (two) times daily. (Patient not taking: Reported on 11/06/2023)   No facility-administered medications prior to visit.    Review of Systems  Constitutional:  Negative for chills, fatigue and fever.  Gastrointestinal:  Positive for abdominal pain (supapubic pain last week- has resolved).  Genitourinary:  Positive for dysuria and flank pain. Negative for hematuria, vaginal bleeding, vaginal discharge and vaginal pain.        Objective    BP 118/76   Pulse 86   Resp 16   Ht 5' 4 (1.626 m)   Wt 148 lb (67.1 kg)   SpO2 98%   BMI 25.40 kg/m     Physical Exam Vitals reviewed.  Constitutional:      General: She is awake.     Appearance: Normal appearance. She is well-developed and well-groomed.  HENT:     Head: Normocephalic and atraumatic.  Eyes:     General: Lids are  normal. Gaze aligned appropriately.     Extraocular Movements: Extraocular movements intact.     Conjunctiva/sclera: Conjunctivae normal.  Pulmonary:     Effort: Pulmonary effort is normal.  Neurological:     General: No focal deficit present.     Mental Status: She is alert and oriented to person, place, and time.     GCS: GCS eye subscore is 4. GCS verbal subscore is 5. GCS motor subscore is 6.     Cranial Nerves: No cranial nerve deficit, dysarthria or facial asymmetry.  Psychiatric:        Attention and Perception: Attention and perception normal.        Mood and Affect: Mood and affect normal.        Speech: Speech normal.        Behavior: Behavior normal. Behavior is cooperative.        Results for orders placed or performed in visit on 11/06/23  POCT urinalysis dipstick  Result Value Ref Range   Color, UA Yellow    Clarity, UA Clear    Glucose, UA Negative Negative   Bilirubin, UA Negative    Ketones, UA Negative    Spec Grav, UA >=1.030 (A) 1.010 - 1.025   Blood, UA Negative    pH, UA 6.0 5.0 - 8.0   Protein, UA Negative Negative   Urobilinogen, UA 0.2 0.2 or 1.0 E.U./dL   Nitrite, UA Negative    Leukocytes, UA Negative Negative   Appearance Normal    Odor None     Assessment & Plan      No follow-ups on file.      Problem List Items Addressed This Visit   None Visit Diagnoses       Dysuria    -  Primary   Relevant Orders   POCT urinalysis dipstick (Completed)   Cervicovaginal ancillary only      Acute, new concern Appears to be resolving  She reports last week she had some burning with urination along with flank pain and suprapubic pressure but this has resolved Reviewed urine dip with her during apt- overall benign and normal Will get Cervicovaginal swab for BV, trich and yeast rule out  Discussed that she may have passed a small kidney stone given resolution of symptoms- may need imaging if symptoms return and urine remains normal in appearance Results of swab to dictate further  management Follow up as needed for persistent or progressing symptoms     No follow-ups on file.   I, Zhane Bluitt E Kashaun Bebo, PA-C, have reviewed all documentation for this visit. The documentation on 11/06/23 for the exam, diagnosis, procedures, and orders are all accurate and complete.   [place]y [place], MHS, PA-C [facility]

## 2023-11-06 NOTE — Telephone Encounter (Signed)
 Pt requesting  lab results from  11/06/23 on 11/06/23.  No review from PCP at this time. Reviewed with patient to allow PCP time to review results and other labs still active. Pt verbalized understanding.

## 2023-11-08 LAB — CERVICOVAGINAL ANCILLARY ONLY
Bacterial Vaginitis (gardnerella): POSITIVE — AB
Candida Glabrata: NEGATIVE
Candida Vaginitis: NEGATIVE
Comment: NEGATIVE
Comment: NEGATIVE
Comment: NEGATIVE
Comment: NEGATIVE
Trichomonas: NEGATIVE

## 2023-11-08 NOTE — Telephone Encounter (Signed)
This is not a patient at [facility]. 

## 2023-11-09 ENCOUNTER — Other Ambulatory Visit: Payer: Self-pay

## 2023-11-12 ENCOUNTER — Telehealth: Payer: Self-pay

## 2023-11-12 ENCOUNTER — Other Ambulatory Visit: Payer: Self-pay

## 2023-11-12 MED ORDER — METRONIDAZOLE 500 MG PO TABS
500.0000 mg | ORAL_TABLET | Freq: Two times a day (BID) | ORAL | 0 refills | Status: DC
Start: 2023-11-12 — End: 2023-12-12
  Filled 2023-11-12: qty 14, 7d supply, fill #0

## 2023-11-12 NOTE — Telephone Encounter (Signed)
 Pt called for lab results. Shared provider's note. Pt will start medication this evening. Pt currently reports some HA, and fatigue.  Rocky BRAVO Mecum, PA-C 11/12/2023 12:41 PM EST Back to Top    Your cervicovaginal swab was positive for bacterial vaginosis.  I have sent in a script for Flagyl  to be taken by mouth twice per day for 7 days. Please finish the entire course unless you are instructed to stop or develop an allergic reaction. Please note that this medication can cause severe nausea and vomiting if alcohol is consumed while you are taking it so please refrain from this during the week you are on it. Please let us  know if you have further questions or concerns.

## 2023-11-12 NOTE — Progress Notes (Signed)
 Your cervicovaginal swab was positive for bacterial vaginosis.  I have sent in a script for Flagyl to be taken by mouth twice per day for 7 days. Please finish the entire course unless you are instructed to stop or develop an allergic reaction. Please note that this medication can cause severe nausea and vomiting if alcohol is consumed while you are taking it so please refrain from this during the week you are on it. Please let us know if you have further questions or concerns.

## 2023-11-12 NOTE — Addendum Note (Signed)
 Addended by: Jacquelin Hawking on: 11/12/2023 12:41 PM   Modules accepted: Orders

## 2023-11-19 ENCOUNTER — Other Ambulatory Visit: Payer: Self-pay

## 2023-11-28 ENCOUNTER — Other Ambulatory Visit: Payer: Self-pay

## 2023-11-29 ENCOUNTER — Other Ambulatory Visit: Payer: Self-pay

## 2023-11-29 MED ORDER — CHLORHEXIDINE GLUCONATE 0.12 % MT SOLN
15.0000 mL | OROMUCOSAL | 1 refills | Status: AC
  Filled 2023-11-29: qty 473, 90d supply, fill #0
  Filled 2024-04-09 (×2): qty 473, 90d supply, fill #1

## 2023-11-30 ENCOUNTER — Other Ambulatory Visit: Payer: Self-pay

## 2023-12-07 ENCOUNTER — Encounter: Payer: Self-pay | Admitting: Physician Assistant

## 2023-12-11 NOTE — Progress Notes (Signed)
    NURSE VISIT NOTE  Subjective:    Patient ID: Makayla Mason, female    DOB: [birth year], 58 y.o.   MRN: [id number]       HPI  Patient is a 58 y.o. [id number] female who presents for abdominal pain for 3 days.  Patient denies dysuria, urinary frequency, and urinary urgency.  Patient do have a history of recurrent UTI.  Patient does not have a history of pyelonephritis.    Objective:    BP 127/65   Pulse 91   Ht 5' 3 (1.6 m)   Wt 150 lb 14.4 oz (68.4 kg)   BMI 26.73 kg/m    Lab Review  Lab Results  Component Value Date   COLORU Yellow 11/06/2023   CLARITYU Clear 11/06/2023   GLUCOSEUR Negative 11/06/2023   BILIRUBINUR Negative 11/06/2023   KETONESU Negative 11/06/2023   SPECGRAV >=1.030 (A) 11/06/2023   RBCUR Negative 11/06/2023   PHUR 6.0 11/06/2023   PROTEINUR Negative 11/06/2023   UROBILINOGEN 0.2 11/06/2023   LEUKOCYTESUR Negative 11/06/2023     Assessment:   1. Lower abdominal pain      Plan:   Treatment  Macrobid  100 mg PO BID for 7 days.(Treated for suspected UTI) Maintain adequate hydration.  Follow up if symptoms worsen or fail to improve as anticipated, and as needed.     Camelia Fetters, CMA [facility]

## 2023-12-11 NOTE — Patient Instructions (Addendum)
 Urinary Tract Infection, Female A urinary tract infection (UTI) is an infection in your urinary tract. The urinary tract is made up of organs that make, store, and get rid of pee (urine) in your body. These organs include: The kidneys. The ureters. The bladder. The urethra. What are the causes? Most UTIs are caused by germs called bacteria. They may be in or near your genitals. These germs grow and cause swelling in your urinary tract. What increases the risk? You're more likely to get a UTI if: You're a female. The urethra is shorter in females than in males. You have a soft tube called a catheter that drains your pee. You can't control when you pee or poop. You have trouble peeing because of: A kidney stone. A urinary blockage. A nerve condition that affects your bladder. Not getting enough to drink. You're sexually active. You use a birth control inside your vagina, like spermicide. You're pregnant. You have low levels of the hormone estrogen in your body. You're an older adult. You're also more likely to get a UTI if you have other health problems. These may include: Diabetes. A weak immune system. Your immune system is your body's defense system. Sickle cell disease. Injury of the spine. What are the signs or symptoms? Symptoms may include: Needing to pee right away. Peeing small amounts often. Pain or burning when you pee. Blood in your pee. Pee that smells bad or odd. Pain in your belly or lower back. You may also: Feel confused. This may be the first symptom in older adults. Vomit. Not feel hungry. Feel tired or easily annoyed. Have a fever or chills. How is this diagnosed? A UTI is diagnosed based on your medical history and an exam. You may also have other tests. These may include: Pee tests. Blood tests. Tests for sexually transmitted infections (STIs). If you've had more than one UTI, you may need to have imaging studies done to find out why you keep getting  them. How is this treated? A UTI can be treated by: Taking antibiotics or other medicines. Drinking enough fluid to keep your pee pale yellow. In rare cases, a UTI can cause a very bad condition called sepsis. Sepsis may be treated in the hospital. Follow these instructions at home: Medicines Take your medicines only as told by your health care provider. If you were given antibiotics, take them as told by your provider. Do not stop taking them even if you start to feel better. General instructions Make sure you: Pee often and fully. Do not hold your pee for a long time. Wipe from front to back after you pee or poop. Use each tissue only once when you wipe. Pee after you have sex. Do not douche or use sprays or powders in your genital area. Contact a health care provider if: Your symptoms don't get better after 1-2 days of taking antibiotics. Your symptoms go away and then come back. You have a fever or chills. You vomit or feel like you may vomit. Get help right away if: You have very bad pain in your back or lower belly. You faint. This information is not intended to replace advice given to you by your health care provider. Make sure you discuss any questions you have with your health care provider. Document Revised: 05/31/2023 Document Reviewed: 01/26/2023 Elsevier Patient Education  2024 Elsevier Inc.  Vaginal Infection (Bacterial Vaginosis): What to Know  Bacterial vaginosis is an infection of the vagina. It happens when the balance of  normal germs (bacteria) in the vagina changes. If you don't get treated, it can make it easier for you to get other infections from sex. These are called sexually transmitted infections (STIs). If you're pregnant, you need to get treated right away. This infection can cause a baby to be born early or at a low birth weight. What are the causes? This infection happens when too many harmful germs grow in the vagina. You can't get this infection from  toilet seats, bedsheets, swimming pools, or things that touch your vagina. What increases the risk? Having sex with a new person or more than one person. Having sex without protection. Douching. Having an intrauterine device (IUD). Smoking. Using drugs or drinking alcohol. These can lead you to do risky things. Taking certain antibiotics. Being pregnant. What are the signs or symptoms? Some females have no symptoms. Symptoms may include: A gray or white discharge from your vagina. It can be watery or foamy. A fishy smell. This can happen after sex or during your menstrual period. Itching in and around your vagina. Burning or pain when you pee. How is this treated? This infection is treated with antibiotics. These may be given to you as: A pill. A cream for your vagina. A medicine that you put into your vagina (suppository). If the infection comes back, you may need more antibiotics. Follow these instructions at home: Medicines Take your medicines as told. Take or use your antibiotics as told. Do not stop using them even if you start to feel better. General instructions If the person you have sex with is a female, tell her that you have this infection. She will need to follow up with her doctor. Female partners don't need to be treated. Do not have sex until you finish treatment. Drink more fluids as told. Keep your vagina and butt clean. Wash these areas with warm water each day. Wipe from front to back after you poop. If you're breastfeeding a baby, talk to your doctor if you should keep doing so during treatment. How is this prevented? Self-care Do not douche. Do not use deodorant sprays on your vagina. Wear cotton underwear. Do not wear tight pants and pantyhose, especially in the summer. Safe sex Use condoms the correct way and every time you have sex. Use dental dams to protect yourself during oral sex. Limit how many people you have sex with. Get tested for STIs. The  person you have sex with should also get tested. Drugs and alcohol Do not smoke, vape, or use nicotine or tobacco. Do not use drugs. Limit the amount of alcohol you drink because it can lead you to do risky things. Where to find more information To learn more: Go to tonerpromos.no. Click Health Topics A-Z. Type bacterial vaginosis in the search bar. American Sexual Health Association (ASHA): ashasexualhealth.org U.S. Department of Health and [employer], Office on Women's Health: travellesson.ca Contact a doctor if: Your symptoms don't get better, even after treatment. You have more discharge or pain when you pee. You have a fever or chills. You have pain in your belly or in the area between your hips. You have pain during sex. You bleed from your vagina between menstrual periods. This information is not intended to replace advice given to you by your health care provider. Make sure you discuss any questions you have with your health care provider. Document Revised: 04/11/2023 Document Reviewed: 04/11/2023 Elsevier Patient Education  2024 [employer].

## 2023-12-12 ENCOUNTER — Other Ambulatory Visit: Payer: Self-pay

## 2023-12-12 ENCOUNTER — Other Ambulatory Visit (HOSPITAL_COMMUNITY)
Admission: RE | Admit: 2023-12-12 | Discharge: 2023-12-12 | Disposition: A | Discharge status: Home / Self Care | Payer: BC Managed Care – PPO | Source: Ambulatory Visit | Attending: Obstetrics and Gynecology | Admitting: Obstetrics and Gynecology

## 2023-12-12 ENCOUNTER — Ambulatory Visit: Payer: BC Managed Care – PPO

## 2023-12-12 VITALS — BP 127/65 | HR 91 | Ht 63.0 in | Wt 150.9 lb

## 2023-12-12 DIAGNOSIS — R1030 Lower abdominal pain, unspecified: Secondary | ICD-10-CM

## 2023-12-12 LAB — POCT URINALYSIS DIPSTICK
Bilirubin, UA: NEGATIVE
Blood, UA: POSITIVE
Glucose, UA: NEGATIVE
Ketones, UA: NEGATIVE
Nitrite, UA: NEGATIVE
Protein, UA: POSITIVE — AB
Spec Grav, UA: 1.025 (ref 1.010–1.025)
Urobilinogen, UA: 0.2 U/dL
pH, UA: 5 (ref 5.0–8.0)

## 2023-12-12 MED ORDER — NITROFURANTOIN MONOHYD MACRO 100 MG PO CAPS
100.0000 mg | ORAL_CAPSULE | Freq: Two times a day (BID) | ORAL | 0 refills | Status: AC
  Filled 2023-12-12: qty 14, 7d supply, fill #0

## 2023-12-13 LAB — CERVICOVAGINAL ANCILLARY ONLY
Bacterial Vaginitis (gardnerella): POSITIVE — AB
Candida Glabrata: NEGATIVE
Candida Vaginitis: NEGATIVE
Comment: NEGATIVE
Comment: NEGATIVE
Comment: NEGATIVE

## 2023-12-14 ENCOUNTER — Encounter: Payer: Self-pay | Admitting: Obstetrics and Gynecology

## 2023-12-14 DIAGNOSIS — B9689 Other specified bacterial agents as the cause of diseases classified elsewhere: Secondary | ICD-10-CM

## 2023-12-17 ENCOUNTER — Other Ambulatory Visit: Payer: Self-pay

## 2023-12-17 ENCOUNTER — Telehealth: Payer: Self-pay | Admitting: Obstetrics and Gynecology

## 2023-12-17 MED ORDER — METRONIDAZOLE 0.75 % VA GEL: 1.0000 | Freq: Every day | VAGINAL | Status: AC

## 2023-12-17 NOTE — Telephone Encounter (Signed)
 Medication sent to pharmacy on file. Talked to patient.

## 2023-12-17 NOTE — Telephone Encounter (Signed)
 The patient is calling today due to prescription sent to pharmacy for vaginal gel. The patient is currently at the pharmacy and states "the pharmacy said they are awaiting Dr. Ranny Bye sign off". The patient is aware Dr Denman Fischer is out of the office. Please advise?

## 2023-12-18 ENCOUNTER — Other Ambulatory Visit: Payer: Self-pay

## 2023-12-18 DIAGNOSIS — B9689 Other specified bacterial agents as the cause of diseases classified elsewhere: Secondary | ICD-10-CM

## 2023-12-18 MED ORDER — METRONIDAZOLE 0.75 % EX GEL
1.0000 | Freq: Two times a day (BID) | CUTANEOUS | 0 refills | Status: AC
  Filled 2023-12-18: qty 45, 30d supply, fill #0

## 2023-12-18 NOTE — Telephone Encounter (Signed)
Spoke with patient to verify if she has picked up prescription. She advised she just received notice from pharmacy that prescription is ready. She plans to pick it up tomorrow.

## 2023-12-18 NOTE — Telephone Encounter (Signed)
Mychart message has been sent by Crystal B to address. KW

## 2023-12-19 ENCOUNTER — Other Ambulatory Visit: Payer: Self-pay

## 2023-12-25 ENCOUNTER — Ambulatory Visit: Payer: Self-pay

## 2023-12-25 NOTE — Telephone Encounter (Signed)
 Spoke with pt and she has already contacted Dr Valentino Saxon and is waiting for a response.

## 2023-12-25 NOTE — Telephone Encounter (Signed)
 Needs appt otherwise can contact OB/GYN office since she was treated by them.

## 2023-12-25 NOTE — Telephone Encounter (Signed)
  Chief Complaint: Pt states she has a UTI and BV Symptoms: Burning with urination + BV test Frequency:  Pertinent Negatives: Patient denies  Disposition: [] ED /[] Urgent Care (no appt availability in office) / [] Appointment(In office/virtual)/ []  [facility]/ [] Home Care/ [] Refused Recommended Disposition /[] [facility]/ []  Follow-up with PCP Additional Notes: Pt states she was diagnosed with BV at her OB's office, and she has burning with urination. Pt unable to take available appts. Pt states she will call her OB for appt and care.    Summary: Possible UTI & BV   Pt believes she still has a UTI and possible BV. Pt occasionally has burning sensation while urinating. Pt requesting appointment but if can't get in soon would like something to be sent in.  Please assist pt further     Reason for Disposition  All other vaginal symptoms  (Exception: Feels like prior yeast infection, minor abrasion, mild rash < 24 hour duration, mild itching.)  All other urine symptoms  Answer Assessment - Initial Assessment Questions 1. SYMPTOM: "What's the main symptom you're concerned about?" (e.g., pain, itching, dryness)     Burning with urination, also has BV s/s 2. LOCATION: "Where is the   located?" (e.g., inside/outside, left/right)     Vaginal area 3. ONSET: "When did the    start?"     ongoing 6. CAUSE: "What do you think is causing the discharge?" "Have you had the same problem before? What happened then?"     Pt states, UTI and BV 7. OTHER SYMPTOMS: "Do you have any other symptoms?" (e.g., fever, itching, vaginal bleeding, pain with urination, injury to genital area, vaginal foreign body)     Burning with urination  Answer Assessment - Initial Assessment Questions 1. SYMPTOM: "What's the main symptom you're concerned about?" (e.g., frequency, incontinence)     Burning with urination  4. CAUSE: "What do you think is causing the symptoms?"     uti  Protocols used:  Vaginal Symptoms-A-AH, Urinary Symptoms-A-AH

## 2023-12-26 ENCOUNTER — Other Ambulatory Visit: Payer: Self-pay

## 2024-01-02 ENCOUNTER — Ambulatory Visit: Payer: BC Managed Care – PPO | Admitting: Obstetrics and Gynecology

## 2024-01-09 ENCOUNTER — Other Ambulatory Visit: Payer: Self-pay

## 2024-01-21 ENCOUNTER — Other Ambulatory Visit: Payer: Self-pay

## 2024-01-22 ENCOUNTER — Other Ambulatory Visit: Payer: Self-pay

## 2024-01-22 MED ORDER — ESTRADIOL 0.1 MG/GM VA CREA
TOPICAL_CREAM | VAGINAL | 3 refills | Status: AC
  Filled 2024-01-22: qty 42.5, 30d supply, fill #0

## 2024-01-28 ENCOUNTER — Other Ambulatory Visit: Payer: Self-pay

## 2024-02-01 ENCOUNTER — Other Ambulatory Visit: Payer: Self-pay

## 2024-02-04 ENCOUNTER — Other Ambulatory Visit: Payer: Self-pay

## 2024-02-05 ENCOUNTER — Other Ambulatory Visit: Payer: Self-pay

## 2024-02-25 ENCOUNTER — Other Ambulatory Visit: Payer: Self-pay

## 2024-02-25 MED ORDER — EPINEPHRINE 0.3 MG/0.3ML IJ SOAJ
INTRAMUSCULAR | 0 refills | Status: AC
Start: 2024-02-25 — End: ?
  Filled 2024-02-25: qty 2, 30d supply, fill #0

## 2024-04-09 ENCOUNTER — Other Ambulatory Visit: Payer: Self-pay

## 2024-04-14 ENCOUNTER — Other Ambulatory Visit: Payer: Self-pay

## 2024-04-21 ENCOUNTER — Other Ambulatory Visit: Payer: Self-pay

## 2024-05-19 ENCOUNTER — Other Ambulatory Visit: Payer: Self-pay

## 2024-05-20 ENCOUNTER — Other Ambulatory Visit: Payer: Self-pay

## 2024-05-22 ENCOUNTER — Other Ambulatory Visit: Payer: Self-pay

## 2024-05-23 ENCOUNTER — Other Ambulatory Visit: Payer: Self-pay

## 2024-05-26 ENCOUNTER — Other Ambulatory Visit: Payer: Self-pay

## 2024-05-28 ENCOUNTER — Other Ambulatory Visit: Payer: Self-pay

## 2024-05-29 ENCOUNTER — Other Ambulatory Visit: Payer: Self-pay

## 2024-06-18 ENCOUNTER — Other Ambulatory Visit: Payer: Self-pay

## 2024-06-27 ENCOUNTER — Other Ambulatory Visit: Payer: Self-pay

## 2024-06-27 MED ORDER — CELECOXIB 200 MG PO CAPS
200.0000 mg | ORAL_CAPSULE | Freq: Every day | ORAL | 0 refills | Status: DC
  Filled 2024-06-27: qty 30, 30d supply, fill #0

## 2024-07-02 ENCOUNTER — Encounter: Payer: Self-pay | Admitting: Family Medicine

## 2024-07-02 ENCOUNTER — Other Ambulatory Visit: Payer: Self-pay

## 2024-07-02 DIAGNOSIS — Z1231 Encounter for screening mammogram for malignant neoplasm of breast: Secondary | ICD-10-CM

## 2024-07-17 ENCOUNTER — Other Ambulatory Visit: Payer: Self-pay

## 2024-07-28 ENCOUNTER — Other Ambulatory Visit: Payer: Self-pay

## 2024-07-29 ENCOUNTER — Encounter

## 2024-07-30 ENCOUNTER — Other Ambulatory Visit: Payer: Self-pay

## 2024-07-30 MED ORDER — CELECOXIB 200 MG PO CAPS
200.0000 mg | ORAL_CAPSULE | Freq: Every day | ORAL | 0 refills | Status: DC
  Filled 2024-07-30: qty 30, 30d supply, fill #0

## 2024-08-01 ENCOUNTER — Other Ambulatory Visit: Payer: Self-pay

## 2024-08-18 ENCOUNTER — Other Ambulatory Visit: Payer: Self-pay

## 2024-08-26 ENCOUNTER — Encounter

## 2024-08-29 ENCOUNTER — Ambulatory Visit
Admission: RE | Admit: 2024-08-29 | Discharge: 2024-08-29 | Disposition: A | Discharge status: Home / Self Care | Source: Ambulatory Visit

## 2024-08-29 DIAGNOSIS — Z1231 Encounter for screening mammogram for malignant neoplasm of breast: Secondary | ICD-10-CM | POA: Insufficient documentation

## 2024-09-01 ENCOUNTER — Other Ambulatory Visit: Payer: Self-pay

## 2024-09-09 ENCOUNTER — Other Ambulatory Visit: Payer: Self-pay

## 2024-09-09 MED ORDER — METRONIDAZOLE 500 MG PO TABS
500.0000 mg | ORAL_TABLET | Freq: Two times a day (BID) | ORAL | 0 refills | Status: AC
  Filled 2024-09-09: qty 14, 7d supply, fill #0

## 2024-09-22 ENCOUNTER — Other Ambulatory Visit: Payer: Self-pay

## 2024-09-26 ENCOUNTER — Other Ambulatory Visit: Payer: Self-pay

## 2024-09-26 MED ORDER — METRONIDAZOLE 0.75 % VA GEL
1.0000 | Freq: Every day | VAGINAL | 0 refills | Status: AC
  Filled 2024-09-26: qty 70, 7d supply, fill #0

## 2024-10-08 ENCOUNTER — Other Ambulatory Visit: Payer: Self-pay

## 2024-10-10 ENCOUNTER — Other Ambulatory Visit: Payer: Self-pay

## 2024-10-13 ENCOUNTER — Other Ambulatory Visit: Payer: Self-pay

## 2024-10-15 ENCOUNTER — Other Ambulatory Visit: Payer: Self-pay

## 2024-10-21 ENCOUNTER — Other Ambulatory Visit: Payer: Self-pay

## 2024-10-23 ENCOUNTER — Other Ambulatory Visit: Payer: Self-pay

## 2024-10-23 ENCOUNTER — Other Ambulatory Visit (HOSPITAL_COMMUNITY): Payer: Self-pay

## 2024-10-23 MED ORDER — CLOTRIMAZOLE-BETAMETHASONE 1-0.05 % EX CREA
1.0000 | TOPICAL_CREAM | Freq: Two times a day (BID) | CUTANEOUS | 0 refills | Status: AC
  Filled 2024-10-23: qty 45, 23d supply, fill #0

## 2024-10-23 MED ORDER — METRONIDAZOLE 0.75 % VA GEL
1.0000 | Freq: Every day | VAGINAL | 0 refills | Status: AC
  Filled 2024-10-23: qty 70, 7d supply, fill #0

## 2024-10-23 MED FILL — Fluconazole Tab 150 MG: 150.0000 mg | ORAL | 1 days supply | Qty: 1 | Fill #0 | Status: AC

## 2024-10-24 ENCOUNTER — Other Ambulatory Visit: Payer: Self-pay

## 2024-10-24 MED ORDER — CELECOXIB 200 MG PO CAPS
200.0000 mg | ORAL_CAPSULE | Freq: Every day | ORAL | 0 refills | Status: AC
  Filled 2024-10-24: qty 30, 30d supply, fill #0

## 2024-10-27 ENCOUNTER — Other Ambulatory Visit: Payer: Self-pay

## 2024-12-08 ENCOUNTER — Other Ambulatory Visit: Payer: Self-pay
